# Patient Record
Sex: Female | Born: 1990
Health system: Southern US, Community
[De-identification: ages and names within clinical notes are randomized; demographics above are authoritative.]

## PROBLEM LIST (undated history)

## (undated) DIAGNOSIS — R519 Headache, unspecified: Secondary | ICD-10-CM

## (undated) DIAGNOSIS — F419 Anxiety disorder, unspecified: Secondary | ICD-10-CM

## (undated) DIAGNOSIS — F32A Depression, unspecified: Secondary | ICD-10-CM

## (undated) DIAGNOSIS — R030 Elevated blood-pressure reading, without diagnosis of hypertension: Secondary | ICD-10-CM

## (undated) DIAGNOSIS — R51 Headache: Secondary | ICD-10-CM

## (undated) DIAGNOSIS — N390 Urinary tract infection, site not specified: Secondary | ICD-10-CM

## (undated) DIAGNOSIS — J45909 Unspecified asthma, uncomplicated: Secondary | ICD-10-CM

## (undated) HISTORY — DX: Headache: R51

## (undated) HISTORY — DX: Unspecified asthma, uncomplicated: J45.909

## (undated) HISTORY — DX: Urinary tract infection, site not specified: N39.0

## (undated) HISTORY — DX: Headache, unspecified: R51.9

## (undated) HISTORY — DX: Elevated blood-pressure reading, without diagnosis of hypertension: R03.0

---

## 2011-02-01 ENCOUNTER — Other Ambulatory Visit (HOSPITAL_COMMUNITY)
Admission: RE | Admit: 2011-02-01 | Discharge: 2011-02-01 | Disposition: A | Discharge status: Home / Self Care | Payer: Commercial Indemnity | Source: Ambulatory Visit | Attending: Obstetrics and Gynecology | Admitting: Obstetrics and Gynecology

## 2011-02-01 DIAGNOSIS — Z113 Encounter for screening for infections with a predominantly sexual mode of transmission: Secondary | ICD-10-CM | POA: Insufficient documentation

## 2011-02-01 DIAGNOSIS — Z01419 Encounter for gynecological examination (general) (routine) without abnormal findings: Secondary | ICD-10-CM | POA: Insufficient documentation

## 2012-01-31 ENCOUNTER — Ambulatory Visit (INDEPENDENT_AMBULATORY_CARE_PROVIDER_SITE_OTHER): Payer: 59 | Admitting: Physician Assistant

## 2012-01-31 VITALS — BP 120/70 | HR 76 | Temp 97.6°F | Resp 16

## 2012-01-31 DIAGNOSIS — R0602 Shortness of breath: Secondary | ICD-10-CM

## 2012-01-31 LAB — POCT CBC
Granulocyte percent: 69.3 %G (ref 37–80)
HCT, POC: 46.7 % (ref 37.7–47.9)
Hemoglobin: 14.5 g/dL (ref 12.2–16.2)
Lymph, poc: 2 (ref 0.6–3.4)
MCH, POC: 29.1 pg (ref 27–31.2)
MCHC: 31 g/dL — AB (ref 31.8–35.4)
MCV: 93.6 fL (ref 80–97)
MID (cbc): 0.6 (ref 0–0.9)
MPV: 10.1 fL (ref 0–99.8)
POC Granulocyte: 5.8 (ref 2–6.9)
POC LYMPH PERCENT: 23.4 %L (ref 10–50)
POC MID %: 7.3 %M (ref 0–12)
Platelet Count, POC: 275 10*3/uL (ref 142–424)
RBC: 4.99 M/uL (ref 4.04–5.48)
RDW, POC: 14.7 %
WBC: 8.4 10*3/uL (ref 4.6–10.2)

## 2012-01-31 MED ORDER — ALBUTEROL SULFATE HFA 108 (90 BASE) MCG/ACT IN AERS: 2.0000 | INHALATION_SPRAY | Freq: Four times a day (QID) | RESPIRATORY_TRACT | Status: DC | PRN

## 2012-01-31 MED ORDER — RANITIDINE HCL 300 MG PO TABS: 300.0000 mg | ORAL_TABLET | Freq: Every day | ORAL | Status: DC

## 2012-01-31 MED ORDER — ALBUTEROL SULFATE (2.5 MG/3ML) 0.083% IN NEBU
2.5000 mg | INHALATION_SOLUTION | Freq: Once | RESPIRATORY_TRACT | Status: AC
  Administered 2012-01-31: 2.5 mg via RESPIRATORY_TRACT

## 2012-01-31 NOTE — Progress Notes (Signed)
  Subjective:    Patient ID: Katherine [name], female    DOB: 08/20/1990, 21 y.o.   MRN: [id number]  HPI 21 yr old CF presents with the sensation she can't get a deep breath in.  Denies chest pain.  She isn't sure if she is wheezing.  She has not felt sick.  This started this morning at around 10am. She felt as though her throat was swelling.  She has not been on any medications.  She hasn't taken any antibiotics.  She took motrin this morning for a headache, and she says that she takes motrin on a regular basis. Her throat is not sore.  She has used albuterol in the past for wheezing, but she has no history of asthma. Denies chest pain.  Review of Systems  All other systems reviewed and are negative.       Objective:   Physical Exam  Nursing note and vitals reviewed. Constitutional: She is oriented to person, place, and time. She appears well-developed and well-nourished.  HENT:  Head: Normocephalic and atraumatic.  Mouth/Throat: Oropharynx is clear and moist. Oropharyngeal exudate: no edema .  Airway patent. 3-4 erythematous 2-48mm lesions on palatine arches and soft palate.       B canals ~90% blocked with cerumen. Tonsils not enlarged. Uvula normal size and midline.  Neck: Normal range of motion. Neck supple. No thyromegaly present.  Cardiovascular: Normal rate, regular rhythm, normal heart sounds and intact distal pulses.  Exam reveals no gallop and no friction rub.   No murmur heard. Pulmonary/Chest: Effort normal. No respiratory distress (wheezes present only with forced expiration). She has wheezes. She has no rales.  Neurological: She is alert and oriented to person, place, and time.  Skin: Skin is warm and dry.  Psychiatric: She has a normal mood and affect. Her behavior is normal.     Results for orders placed in visit on 01/31/12  POCT CBC      Component Value Range   WBC 8.4  4.6 - 10.2 K/uL   Lymph, poc 2.0  0.6 - 3.4   POC LYMPH PERCENT 23.4  10 - 50 %L   MID (cbc) 0.6   0 - 0.9   POC MID % 7.3  0 - 12 %M   POC Granulocyte 5.8  2 - 6.9   Granulocyte percent 69.3  37 - 80 %G   RBC 4.99  4.04 - 5.48 M/uL   Hemoglobin 14.5  12.2 - 16.2 g/dL   HCT, POC 40.9  81.1 - 47.9 %   MCV 93.6  80 - 97 fL   MCH, POC 29.1  27 - 31.2 pg   MCHC 31.0 (*) 31.8 - 35.4 g/dL   RDW, POC 91.4     Platelet Count, POC 275  142 - 424 K/uL   MPV 10.1  0 - 99.8 fL   Subjective improvement after breathing treatment.     Assessment & Plan:  SOB/wheezing-improved after albuterol and she continues to have 100% pulse ox.  If there was an environmental trigger, we are unsure of what it is.  I advised avoiding motrin/ibuprofen for now although it is unlikely the etiology. Start Zantac and zyrtec to cover for allergic component.  Recheck in 48 hrs, to ER if worsens.

## 2012-01-31 NOTE — Patient Instructions (Addendum)
10 mg zyrtec daily X 2weeks.

## 2012-02-16 ENCOUNTER — Other Ambulatory Visit (HOSPITAL_COMMUNITY)
Admission: RE | Admit: 2012-02-16 | Discharge: 2012-02-16 | Disposition: A | Discharge status: Home / Self Care | Payer: 59 | Source: Ambulatory Visit | Attending: Obstetrics and Gynecology | Admitting: Obstetrics and Gynecology

## 2012-02-16 ENCOUNTER — Other Ambulatory Visit: Payer: Self-pay | Admitting: Obstetrics and Gynecology

## 2012-02-16 DIAGNOSIS — R87810 Cervical high risk human papillomavirus (HPV) DNA test positive: Secondary | ICD-10-CM | POA: Insufficient documentation

## 2012-02-16 DIAGNOSIS — Z113 Encounter for screening for infections with a predominantly sexual mode of transmission: Secondary | ICD-10-CM | POA: Insufficient documentation

## 2012-02-16 DIAGNOSIS — Z124 Encounter for screening for malignant neoplasm of cervix: Secondary | ICD-10-CM | POA: Insufficient documentation

## 2014-06-13 LAB — TSH: TSH: 1.65 u[IU]/mL (ref 0.41–5.90)

## 2014-06-13 LAB — CBC AND DIFFERENTIAL
HCT: 41 % (ref 36–46)
Hemoglobin: 13.8 g/dL (ref 12.0–16.0)
Platelets: 231 10*3/uL (ref 150–399)
WBC: 6.6 10^3/mL

## 2014-06-13 LAB — HEPATIC FUNCTION PANEL
ALT: 9 U/L (ref 7–35)
AST: 15 U/L (ref 13–35)
Bilirubin, Total: 0.5 mg/dL

## 2014-06-13 LAB — BASIC METABOLIC PANEL
BUN: 10 mg/dL (ref 4–21)
Creatinine: 0.6 mg/dL (ref 0.5–1.1)
Glucose: 86 mg/dL
Potassium: 4.8 mmol/L (ref 3.4–5.3)
Sodium: 142 mmol/L (ref 137–147)

## 2015-11-03 ENCOUNTER — Telehealth: Payer: Self-pay | Admitting: Family Medicine

## 2015-11-03 NOTE — Telephone Encounter (Signed)
Reason for call: Pt called to set up new pt appt and her sister recommended you. She has [employer] ins and was recently in [place]. She has been vomiting and had diarrhea for a couple days and wanting to see if we can fit her in sooner than 11/19/15 for new pt appt. Please advise.

## 2015-11-04 ENCOUNTER — Ambulatory Visit (INDEPENDENT_AMBULATORY_CARE_PROVIDER_SITE_OTHER): Payer: BLUE CROSS/BLUE SHIELD | Admitting: Family Medicine

## 2015-11-04 ENCOUNTER — Encounter: Payer: Self-pay | Admitting: Family Medicine

## 2015-11-04 VITALS — BP 128/89 | HR 97 | Temp 98.1°F | Ht 67.0 in | Wt 198.0 lb

## 2015-11-04 DIAGNOSIS — R1084 Generalized abdominal pain: Secondary | ICD-10-CM | POA: Diagnosis not present

## 2015-11-04 DIAGNOSIS — A09 Infectious gastroenteritis and colitis, unspecified: Secondary | ICD-10-CM

## 2015-11-04 DIAGNOSIS — R197 Diarrhea, unspecified: Secondary | ICD-10-CM

## 2015-11-04 MED ORDER — AZITHROMYCIN 500 MG PO TABS: 500.0000 mg | ORAL_TABLET | Freq: Every day | ORAL | Status: DC

## 2015-11-04 NOTE — Telephone Encounter (Signed)
Patient scheduled today at 3:45pm in a 15 minute slot will schedule new patient appointment when patient comes in.

## 2015-11-04 NOTE — Telephone Encounter (Signed)
That is fine. Will ask Clydie BraunKaren to get her scheduled today or tomorrow if possible
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
## 2015-11-04 NOTE — Progress Notes (Signed)
Pre visit review using our clinic tool,if applicable. No additional management support is needed unless otherwise documented below in the visit note.  

## 2015-11-04 NOTE — Patient Instructions (Signed)
It was nice to meet you today I will be in touch with your labs asap Start the azithromycin antibiotic once a day for 3 days Drink plenty of fluids and stick to a bland diet until you are better Let me know if you are not improving soon

## 2015-11-05 ENCOUNTER — Encounter: Payer: Self-pay | Admitting: Family Medicine

## 2015-11-05 LAB — CBC
HCT: 40.7 % (ref 36.0–46.0)
Hemoglobin: 13.8 g/dL (ref 12.0–15.0)
MCHC: 33.9 g/dL (ref 30.0–36.0)
MCV: 87.6 fl (ref 78.0–100.0)
Platelets: 284 10*3/uL (ref 150.0–400.0)
RBC: 4.65 Mil/uL (ref 3.87–5.11)
RDW: 12.7 % (ref 11.5–15.5)
WBC: 8.2 10*3/uL (ref 4.0–10.5)

## 2015-11-05 LAB — COMPREHENSIVE METABOLIC PANEL
ALT: 16 U/L (ref 0–35)
AST: 17 U/L (ref 0–37)
Albumin: 4.2 g/dL (ref 3.5–5.2)
Alkaline Phosphatase: 52 U/L (ref 39–117)
BUN: 11 mg/dL (ref 6–23)
CO2: 27 mEq/L (ref 19–32)
Calcium: 9.5 mg/dL (ref 8.4–10.5)
Chloride: 104 mEq/L (ref 96–112)
Creatinine, Ser: 0.77 mg/dL (ref 0.40–1.20)
GFR: 96.64 mL/min (ref >60.00)
Glucose, Bld: 81 mg/dL (ref 70–99)
Potassium: 4.1 mEq/L (ref 3.5–5.1)
Sodium: 137 mEq/L (ref 135–145)
Total Bilirubin: 0.4 mg/dL (ref 0.2–1.2)
Total Protein: 7.2 g/dL (ref 6.0–8.3)

## 2015-11-05 LAB — LIPASE: Lipase: 30 U/L (ref 11.0–59.0)

## 2015-11-24 ENCOUNTER — Ambulatory Visit (INDEPENDENT_AMBULATORY_CARE_PROVIDER_SITE_OTHER): Payer: BLUE CROSS/BLUE SHIELD | Admitting: Medical

## 2015-11-24 ENCOUNTER — Encounter: Payer: Self-pay | Admitting: Medical

## 2015-11-24 VITALS — BP 118/76 | HR 77 | Temp 98.1°F | Ht 67.0 in | Wt 200.4 lb

## 2015-11-24 DIAGNOSIS — J3489 Other specified disorders of nose and nasal sinuses: Secondary | ICD-10-CM | POA: Diagnosis not present

## 2015-11-24 DIAGNOSIS — R42 Dizziness and giddiness: Secondary | ICD-10-CM

## 2015-11-24 MED ORDER — AZELASTINE HCL 0.1 % NA SOLN: 2.0000 | Freq: Two times a day (BID) | NASAL | Status: DC

## 2015-11-24 MED ORDER — MECLIZINE HCL 12.5 MG PO TABS: 12.5000 mg | ORAL_TABLET | Freq: Three times a day (TID) | ORAL | Status: DC | PRN

## 2015-11-24 NOTE — Progress Notes (Signed)
   Subjective:    Patient ID: Katherine [name], female    DOB: 03/20/1991, 25 y.o.   MRN: [id number]  HPI  Pt in states yesterday morning after getting up she felt transient dizziness and unsteadiness all day. She states was moderate yesterday. Some present today but less today. Pt states pressure in her forehead. Pt denies any sneezing, itching eye or runny nose. No definite fevers chills or sweats.  Pt states when she lays on her back and turn her head notices some room spinning when she turns her head. More notices when turns to the left.   Pt states no ha. Pt states yesterday at work mild straining looking at computer. Pt states about one year since she got eyes checked.  LMP- Nov 08, 2015. Normal cycle and came when expected.  Earlier this month pt cbc and cmp looked normal.   Review of Systems  Constitutional: Negative for fever, chills and fatigue.  Respiratory: Negative for cough, shortness of breath and wheezing.   Cardiovascular: Negative for chest pain and palpitations.  Gastrointestinal: Negative for nausea, vomiting and abdominal pain.  Musculoskeletal: Negative for back pain.  Skin: Negative for rash.  Neurological: Positive for dizziness. Negative for speech difficulty, weakness, numbness and headaches.  Hematological: Negative for adenopathy. Does not bruise/bleed easily.  Psychiatric/Behavioral: Negative for behavioral problems, dysphoric mood and decreased concentration. The patient is not nervous/anxious.        Objective:   Physical Exam  General Mental Status- Alert. General Appearance- Not in acute distress.    HEENT Head- Normal. Ear Auditory Canal - Left- Normal. Right - Normal.Tympanic Membrane- Left- Normal. Right- Normal. Eye Sclera/Conjunctiva- Left- Normal. Right- Normal. Nose & Sinuses Nasal Mucosa- Left-  Not Boggy and Congested. Right-not  Boggy and  Congested.Bilateral  No maxillary and but reported some  frontal sinus pressure.(none on  palpation)  Mouth & Throat Lips: Upper Lip- Normal: no dryness, cracking, pallor, cyanosis, or vesicular eruption. Lower Lip-Normal: no dryness, cracking, pallor, cyanosis or vesicular eruption. Buccal Mucosa- Bilateral- No Aphthous ulcers. Oropharynx- No Discharge or Erythema. Tonsils: Characteristics- Bilateral- No Erythema or Congestion. Size/Enlargement- Bilateral- No enlargement. Discharge- bilateral-None.   Skin General: Color- Normal Color. Moisture- Normal Moisture.  Neck Carotid Arteries- Normal color. Moisture- Normal Moisture. No carotid bruits. No JVD.  Chest and Lung Exam Auscultation: Breath Sounds:-Normal.  Cardiovascular Auscultation:Rythm- Regular. Murmurs & Other Heart Sounds:Auscultation of the heart reveals- No Murmurs.  Abdomen Inspection:-Inspeection Normal. Palpation/Percussion:Note:No mass. Palpation and Percussion of the abdomen reveal- Non Tender, Non Distended + BS, no rebound or guarding.   Neurologic Cranial Nerve exam:- CN III-XII intact(No nystagmus), symmetric smile. Drift Test:- No drift. Romberg Exam:- Negative.  Heal to Toe Gait exam:-Normal. Finger to Nose:- Normal/Intact Strength:- 5/5 equal and symmetric strength both upper and lower extremities.  Vision done today. See hearing and vision section.     Assessment & Plan:  For your dizziness and mild features of vertigo will rx meclizine.   I think your dizziness will gradually subside. If the features of spinning upon  head turning  Is not resolving then may refer to PT for head maneuver exercises.   For mild sinus pressure will rx astelin. If sinus pressure worsens notify us.  Regarding dizziness if persisting or with new signs and symptoms as discussed then will need to expand work up.  Red flags discussed that would necessitate ED evaluation.  Follow up in 10-14 days or as needed

## 2015-11-24 NOTE — Patient Instructions (Addendum)
For your dizziness and mild features of vertigo will rx meclizine.   I think your dizziness will gradually subside. If the features of spinning upon  head turning  Is not resolving then may refer to PT for head maneuver exercises.   For mild sinus pressure will rx astelin. If sinus pressure worsens notify us.  Regarding dizziness if persisting or with new signs and symptoms as discussed then will need to expand work up.  Red flags discussed that would necessitate ED evaluation.  Follow up in 10-14 days or as needed

## 2015-11-24 NOTE — Progress Notes (Signed)
Pre visit review using our clinic review tool, if applicable. No additional management support is needed unless otherwise documented below in the visit note. 

## 2016-01-13 ENCOUNTER — Telehealth: Payer: Self-pay | Admitting: *Deleted

## 2016-01-13 NOTE — Telephone Encounter (Signed)
Unable to reach patient at time of Pre-Visit Call.  Left message for patient to return call when available.    

## 2016-01-13 NOTE — Telephone Encounter (Signed)
Patient returning your call.

## 2016-01-13 NOTE — Telephone Encounter (Signed)
Returned call, no answer.

## 2016-01-14 ENCOUNTER — Encounter: Payer: Self-pay | Admitting: Family Medicine

## 2016-01-14 ENCOUNTER — Ambulatory Visit (INDEPENDENT_AMBULATORY_CARE_PROVIDER_SITE_OTHER): Payer: BLUE CROSS/BLUE SHIELD | Admitting: Family Medicine

## 2016-01-14 VITALS — BP 127/76 | HR 83 | Temp 97.9°F | Ht 68.0 in | Wt 202.2 lb

## 2016-01-14 DIAGNOSIS — Z7189 Other specified counseling: Secondary | ICD-10-CM

## 2016-01-14 DIAGNOSIS — E663 Overweight: Secondary | ICD-10-CM | POA: Diagnosis not present

## 2016-01-14 DIAGNOSIS — N390 Urinary tract infection, site not specified: Secondary | ICD-10-CM

## 2016-01-14 DIAGNOSIS — M791 Myalgia: Secondary | ICD-10-CM

## 2016-01-14 DIAGNOSIS — M7918 Myalgia, other site: Secondary | ICD-10-CM

## 2016-01-14 DIAGNOSIS — Z7689 Persons encountering health services in other specified circumstances: Secondary | ICD-10-CM

## 2016-01-14 LAB — POC URINALSYSI DIPSTICK (AUTOMATED)
Bilirubin, UA: NEGATIVE
Blood, UA: NEGATIVE
Glucose, UA: NEGATIVE
Ketones, UA: NEGATIVE
Leukocytes, UA: NEGATIVE
Nitrite, UA: NEGATIVE
Protein, UA: NEGATIVE
Spec Grav, UA: >=1.03
Urobilinogen, UA: NEGATIVE
pH, UA: 6

## 2016-01-14 MED ORDER — NITROFURANTOIN MONOHYD MACRO 100 MG PO CAPS: ORAL_CAPSULE | ORAL | Status: DC

## 2016-01-14 NOTE — Progress Notes (Signed)
[facility] [address], [address] [place], [place] [address] [phone] [phone]  Date:  01/14/2016   Name:  Katherine RussianRebekah [name]   DOB:  08/25/1990   MRN:  [id number]  PCP:  Abbe AmsterdamOPLAND,JESSICA, MD    Chief Complaint: Establish Care   History of Present Illness:  Katherine RussianRebekah [name] is a 25 y.o. very pleasant female patient who presents with the following:  Seen here a couple of months ago following a trip to [place] and associated diarrhea. She is now fully recovered from her GI illness.  She has frequent UTI- she may notice mild dysuria off and on on a consistent basis.  She will use azo prn.  She has noted some vague discomfort for the last few days and wanted to be checked today  She has been dx with UTI 3-4x in the last year.  She did have culture proven UTI per her report She has been with her current partner for 3 years or so.  She has regular GYN care She feels like this is more frequent when she drinks tea.   No belly pain, back pain or fever.    No vaginal sympotms Generally in good health No surgical history They use condoms for contraception-she has tried hormonal contraception but did not like SE Also she has noted some pain and tingling in her left leg/ buttock with prolonged sitting.  NKI.    There are no active problems to display for this patient.   No past medical history on file.  No past surgical history on file.  Social History  Substance Use Topics  . Smoking status: Never Smoker   . Smokeless tobacco: Never Used  . Alcohol Use: 1.2 - 1.8 oz/week    2-3 Standard drinks or equivalent per week     Comment: occassionally    No family history on file.  Allergies  Allergen Reactions  . Prednisone Other (See Comments)    Pt states "It makes me feel weird.    Medication list has been reviewed and updated.  No current outpatient prescriptions on file prior to visit.   No current facility-administered medications  on file prior to visit.    Review of Systems:  As per HPI- otherwise negative.   Physical Examination: Filed Vitals:   01/14/16 0836  BP: 127/76  Pulse: 83  Temp: 97.9 F (36.6 C)   Filed Vitals:   01/14/16 0836  Height: 5\' 8"  (1.727 m)  Weight: 202 lb 3.2 oz (91.717 kg)   Body mass index is 30.75 kg/(m^2). Ideal Body Weight: Weight in (lb) to have BMI = 25: 164.1  GEN: WDWN, NAD, Non-toxic, A & O x 3, overweight, looks well HEENT: Atraumatic, Normocephalic. Neck supple. No masses, No LAD. Ears and Nose: No external deformity. CV: RRR, No M/G/R. No JVD. No thrill. No extra heart sounds. PULM: CTA B, no wheezes, crackles, rhonchi. No retractions. No resp. distress. No accessory muscle use. ABD: S, NT, ND. No rebound. No HSM. EXTR: No c/c/e NEURO Normal gait.  PSYCH: Normally interactive. Conversant. Not depressed or anxious appearing.  Calm demeanor.  She has tenderness over the left sciatic notch/piraformis muscle  Results for orders placed or performed in visit on 01/14/16  POCT Urinalysis Dipstick (Automated)  Result Value Ref Range   Color, UA yellow    Clarity, UA clear    Glucose, UA negative    Bilirubin, UA negative    Ketones, UA negative  Spec Grav, UA >=1.030    Blood, UA negative    pH, UA 6.0    Protein, UA negative    Urobilinogen, UA negative    Nitrite, UA negative    Leukocytes, UA Negative Negative     Assessment and Plan: Recurrent UTI - Plan: POCT Urinalysis Dipstick (Automated), nitrofurantoin, macrocrystal-monohydrate, (MACROBID) 100 MG capsule, Urine culture  Piriformis muscle pain  Overweight  Encounter to establish care  Recent history of recurrent UTI_ at this time her urine appears negative, but will culture for her.  She would like to try taking an abx as needed following intercourse (she estimates she and her partner have sex 2x a week on average so this is a reasonable plan. rx for macrobid to try in this manner, she will  let me know how this does for her Await urine culture but do not suspect current UTI  Discussed tennis ball massage/ piriformis stretch for her- she will let me know if this does not help with her left buttock and upper thigh pain  Signed Abbe Amsterdam, MD

## 2016-01-14 NOTE — Patient Instructions (Signed)
It was good to see you today Tennis ball massage and the crossed over leg stretch can be helpful for your piriformis muscle Try taking a macrobid pill following intercourse to see if we can prevent such frequent UTI- I will also let you know if your urine culture shows a current UTI, but I think it will be negative  Take care and see me as needed

## 2016-01-14 NOTE — Progress Notes (Signed)
Pre visit review using our clinic review tool, if applicable. No additional management support is needed unless otherwise documented below in the visit note. 

## 2016-01-20 ENCOUNTER — Telehealth: Payer: Self-pay | Admitting: *Deleted

## 2016-01-20 NOTE — Telephone Encounter (Signed)
Forwarded to Dr. Copland 

## 2016-01-24 ENCOUNTER — Telehealth: Payer: Self-pay | Admitting: Family Medicine

## 2016-01-24 NOTE — Telephone Encounter (Signed)
Received her records from [facility]- abstract all pertinent information.

## 2016-01-27 ENCOUNTER — Telehealth: Payer: Self-pay | Admitting: Family Medicine

## 2016-01-27 NOTE — Telephone Encounter (Signed)
Urine culture did not get sent out from her last visit.  Called her and LMOM- I apologize that her culture did not get to the lab for whatever reason.   Her urine dip looked fine, so if she is feeling well nothing further needed.  However if she has any current UTI sx please let me know and I will order a culture for her, she can drop off a sample

## 2016-01-27 NOTE — Telephone Encounter (Signed)
-----   Message from Cammy Copa, [place] sent at 01/27/2016  2:56 PM EDT -----   ----- Message ----- From: Pearline Cables, MD Sent: 01/24/2016   7:10 AM To: Cammy Copa, CMA  We saw her on 7/13 and ordered a urine culture that has not resulted.  Can you please ask the lab about this?   Thanks! JC

## 2016-02-11 ENCOUNTER — Other Ambulatory Visit: Payer: Self-pay | Admitting: Family Medicine

## 2016-02-25 ENCOUNTER — Encounter: Payer: Self-pay | Admitting: Family Medicine

## 2016-03-17 ENCOUNTER — Ambulatory Visit (INDEPENDENT_AMBULATORY_CARE_PROVIDER_SITE_OTHER): Payer: BLUE CROSS/BLUE SHIELD | Admitting: Family Medicine

## 2016-03-17 ENCOUNTER — Encounter: Payer: Self-pay | Admitting: Family Medicine

## 2016-03-17 VITALS — BP 127/95 | HR 91 | Temp 98.0°F | Ht 68.0 in | Wt 208.8 lb

## 2016-03-17 DIAGNOSIS — J029 Acute pharyngitis, unspecified: Secondary | ICD-10-CM

## 2016-03-17 MED ORDER — PENICILLIN V POTASSIUM 500 MG PO TABS: 500.0000 mg | ORAL_TABLET | Freq: Two times a day (BID) | ORAL | 0 refills | Status: DC

## 2016-03-17 NOTE — Progress Notes (Signed)
[facility] [address], [address] [place], [place] [address] [phone]  Date:  03/17/2016   Name:  Katherine [name]   DOB:  1991/04/19   MRN:  [id number]  PCP:  Abbe Amsterdam, MD    Chief Complaint: Sore Throat (c/o sore throat, bump on the back of throat, diff swallowing, loss of appetite, fatigue, and ear pain x 4 days. )   History of Present Illness:  Katherine [name] is a 25 y.o. very pleasant female patient who presents with the following:  Generally healthy young lady here today with complaint of a sore throat.  Today is Thursday- on Monday she noted a ST, ear pain, she has not felt well in general, has had body aches.   She has measured a fever but has felt hot and flushed No vomiting or diarrhea, no belly pain- but she has not felt much like eating.   She has used nyquil at night but not other meds tried  Her father is getting married this weekend- she would like to be well for this event if she can No known sick contacts No cough Both of her ears hurt- neither one really more than the other  LMP 9/3  BP Readings from Last 3 Encounters:  03/17/16 (!) 127/95  01/14/16 127/76  11/24/15 118/76     Patient Active Problem List   Diagnosis Date Noted  . Overweight 01/14/2016    Past Medical History:  Diagnosis Date  . Asthma   . Elevated blood pressure reading   . Frequent headaches   . Recurrent UTI     No past surgical history on file.  Social History  Substance Use Topics  . Smoking status: Never Smoker  . Smokeless tobacco: Never Used  . Alcohol use 1.2 - 1.8 oz/week    2 - 3 Standard drinks or equivalent per week     Comment: occassionally    Family History  Problem Relation Age of Onset  . Hypertension Father   . Hypertension Brother     Allergies  Allergen Reactions  . Prednisone Other (See Comments)    Pt states "It makes me feel weird.    Medication list has been reviewed and  updated.  Current Outpatient Prescriptions on File Prior to Visit  Medication Sig Dispense Refill  . ibuprofen (ADVIL,MOTRIN) 200 MG tablet Take 200 mg by mouth every 6 (six) hours as needed.    . nitrofurantoin, macrocrystal-monohydrate, (MACROBID) 100 MG capsule Take 1 pill as needed following intercourse 30 capsule 1   No current facility-administered medications on file prior to visit.     Review of Systems:  As per HPI- otherwise negative.   Physical Examination: Vitals:   03/17/16 1822  BP: (!) 127/95  Pulse: 91  Temp: 98 F (36.7 C)   Vitals:   03/17/16 1822  Weight: 208 lb 12.8 oz (94.7 kg)  Height: 5\' 8"  (1.727 m)   Body mass index is 31.75 kg/m. Ideal Body Weight: Weight in (lb) to have BMI = 25: 164.1  GEN: WDWN, NAD, Non-toxic, A & O x 3, overweight, looks well HEENT: Atraumatic, Normocephalic. Neck supple. No masses, No LAD.  Bilateral TM wnl, oropharynx is inflamed but no exudate.  Tender cervical nodes, PEERL,EOMI.   Ears and Nose: No external deformity. CV: RRR, No M/G/R. No JVD. No thrill. No extra heart sounds. PULM: CTA B, no wheezes, crackles, rhonchi. No retractions. No resp. distress. No accessory  muscle use. ABD: S, NT, ND EXTR: No c/c/e NEURO Normal gait.  PSYCH: Normally interactive. Conversant. Not depressed or anxious appearing.  Calm demeanor.    Assessment and Plan: Acute pharyngitis, unspecified pharyngitis type - Plan: Culture, Group A Strep, penicillin v potassium (VEETID) 500 MG tablet  Negative rapid strep but her sx and exam are suggestive.  Will start on penicillin now, await culture Discussed supportive care, close follow-up if not better  She has noted mildly elevated DBP at her last visit with OBG and now today- she will monitor this   Signed Abbe AmsterdamJessica Copland, MD

## 2016-03-17 NOTE — Patient Instructions (Signed)
We are going to treat you for likely strep throat with penicillin- take this twice a day for 10 days I will send a throat culture as well If you are not feeling better in the next couple of days please let me know!

## 2016-03-20 LAB — CULTURE, GROUP A STREP: Organism ID, Bacteria: NORMAL

## 2016-04-26 ENCOUNTER — Ambulatory Visit (INDEPENDENT_AMBULATORY_CARE_PROVIDER_SITE_OTHER): Payer: BLUE CROSS/BLUE SHIELD | Admitting: Physician Assistant

## 2016-04-26 VITALS — BP 127/91 | HR 115 | Temp 98.1°F | Resp 16 | Ht 68.0 in | Wt 205.4 lb

## 2016-04-26 DIAGNOSIS — B9789 Other viral agents as the cause of diseases classified elsewhere: Secondary | ICD-10-CM

## 2016-04-26 DIAGNOSIS — J069 Acute upper respiratory infection, unspecified: Secondary | ICD-10-CM | POA: Diagnosis not present

## 2016-04-26 DIAGNOSIS — J029 Acute pharyngitis, unspecified: Secondary | ICD-10-CM | POA: Diagnosis not present

## 2016-04-26 LAB — POC INFLUENZA A&B (BINAX/QUICKVUE)
Influenza A, POC: NEGATIVE
Influenza B, POC: NEGATIVE

## 2016-04-26 NOTE — Addendum Note (Signed)
Addended by: Regis BillSCATES, Voris Tigert L on: 04/26/2016 04:05 PM   Modules accepted: Orders

## 2016-04-26 NOTE — Progress Notes (Signed)
   Patient presents to clinic today c/o 1.5 days of sore throat, nasal congestion, PND and dry cough. Patient also notes ear pain. Endorses aches, fatigue. Endorses recent travel to [place]. Notes some sick contacts on the plane. Has noted mild fever at 100 yesterday. Has not taken anything for symptoms today.  Past Medical History:  Diagnosis Date  . Asthma   . Elevated blood pressure reading   . Frequent headaches   . Recurrent UTI     Current Outpatient Prescriptions on File Prior to Visit  Medication Sig Dispense Refill  . ibuprofen (ADVIL,MOTRIN) 200 MG tablet Take 200 mg by mouth every 6 (six) hours as needed.     No current facility-administered medications on file prior to visit.     Allergies  Allergen Reactions  . Prednisone Other (See Comments)    Pt states "It makes me feel weird.    Family History  Problem Relation Age of Onset  . Hypertension Father   . Hypertension Brother     Social History   Social History  . Marital status: Single    Spouse name: N/A  . Number of children: N/A  . Years of education: N/A   Occupational History  . CSR    Social History Main Topics  . Smoking status: Never Smoker  . Smokeless tobacco: Never Used  . Alcohol use 1.2 - 1.8 oz/week    2 - 3 Standard drinks or equivalent per week     Comment: occassionally  . Drug use: No  . Sexual activity: Yes   Other Topics Concern  . Not on file   Social History Narrative  . No narrative on file   Review of Systems - See HPI.  All other ROS are negative.  BP (!) 127/91 (BP Location: Left Arm, Patient Position: Sitting, Cuff Size: Normal)   Pulse (!) 115   Temp 98.1 F (36.7 C) (Oral)   Resp 16   Ht 5\' 8"  (1.727 m)   Wt 205 lb 6 oz (93.2 kg)   SpO2 96%   BMI 31.23 kg/m   Physical Exam  Constitutional: She is oriented to person, place, and time and well-developed, well-nourished, and in no distress.  HENT:  Head: Normocephalic and atraumatic.  Right Ear: External  ear normal.  Left Ear: External ear normal.  Nose: Nose normal.  Mouth/Throat: Uvula is midline. Posterior oropharyngeal erythema present. No oropharyngeal exudate or posterior oropharyngeal edema.  Eyes: Conjunctivae are normal.  Neck: Neck supple.  Cardiovascular: Normal rate, regular rhythm, normal heart sounds and intact distal pulses.   Pulmonary/Chest: Effort normal and breath sounds normal. No respiratory distress. She has no wheezes. She has no rales. She exhibits no tenderness.  Lymphadenopathy:    She has no cervical adenopathy.  Neurological: She is alert and oriented to person, place, and time.  Skin: Skin is warm and dry. No rash noted.  Psychiatric: Affect normal.  Vitals reviewed.  Recent Results (from the past 2160 hour(s))  Culture, Group A Strep     Status: None   Collection Time: 03/17/16  6:38 PM  Result Value Ref Range   Organism ID, Bacteria      Normal Upper Respiratory Flora No Beta Hemolytic Streptococci Isolated    Assessment/Plan: 1. Viral URI with cough Rapid strep negative. Flu negative. Viral etiology. Will begin supportive measures and OTC medications. FU if not resolving. Handout given to patient.  - POC Influenza A&B (Binax test)    Piedad ClimesMartin, Nicollette Wilhelmi Cody, PA-C

## 2016-04-26 NOTE — Patient Instructions (Signed)
Please stay well hydrated and get plenty of rest. Place a humidifier in the bedroom. GEt some saline nasal spray to flush out nasal passages. Salt-water gargles will help with sore throat. Mucinex-DM for cough.  Follow-up if not slowly improving over the next 4-5 days. We will call with throat culture results.

## 2016-04-26 NOTE — Progress Notes (Signed)
Pre visit review using our clinic review tool, if applicable. No additional management support is needed unless otherwise documented below in the visit note/SLS  

## 2016-04-28 ENCOUNTER — Telehealth: Payer: Self-pay | Admitting: Family Medicine

## 2016-04-28 DIAGNOSIS — J209 Acute bronchitis, unspecified: Secondary | ICD-10-CM

## 2016-04-28 LAB — CULTURE, GROUP A STREP: Organism ID, Bacteria: NORMAL

## 2016-04-28 MED ORDER — AZITHROMYCIN 250 MG PO TABS: ORAL_TABLET | ORAL | 0 refills | Status: DC

## 2016-04-28 NOTE — Telephone Encounter (Signed)
Caller name:Taffany Relationship to patient: Can be [phone] Pharmacy:[facility] on [address]  Reason for call:Patient saw Selena BattenCody on 04/26/16, not feeling any better. Requesting to speak with Dr Patsy Lageropland.

## 2016-04-28 NOTE — Telephone Encounter (Signed)
Called her back- today is Thursday. She has been ill since Sunday, was seen in clinic on Tuesday with likely viral illness.  Negative flu, negative rapid strep and negative throat culture.  However she notes that she is not improving, continues to have low grade fevers to about 100.5, her main concern now is a productive cough that is keeping her awake at night.    Will rx azithromycin for her for possible bronchitis- she will let me know if not better by next week, Sooner if worse.    LMP 04/07/16

## 2016-05-04 ENCOUNTER — Telehealth: Payer: Self-pay | Admitting: Emergency Medicine

## 2016-05-04 MED ORDER — GUAIFENESIN-CODEINE 100-10 MG/5ML PO SYRP: 5.0000 mL | ORAL_SOLUTION | Freq: Three times a day (TID) | ORAL | 0 refills | Status: DC | PRN

## 2016-05-04 NOTE — Telephone Encounter (Signed)
Called her back- she is overall feeling better, but she is still coughing.  Her left ear can feel "full" off an on.  She would like to have something for her cough as it is keeping her and her BF awake.  Will try cheratussin as I can call this in for her- cautioned her that it will cause sedation. She denies any chance of pregnancy.  She will let me know if not feeling better soon

## 2016-05-04 NOTE — Addendum Note (Signed)
Addended by: Abbe AmsterdamOPLAND, Jorgen Wolfinger C on: 05/04/2016 05:42 PM   Modules accepted: Orders

## 2016-05-04 NOTE — Telephone Encounter (Signed)
Pt called back today because now her cough is worse and her ear feels full. Pt states that she did take the azithromycin as prescribed and it did help some with her other sx's. Pt has tried otc meds to help with the ear fullness with no help. Pt doesn't mind coming in to be seen but would like to know if there's anything else she should try before coming in since she has already missed so many days of work. Please advise.

## 2016-05-05 ENCOUNTER — Ambulatory Visit (INDEPENDENT_AMBULATORY_CARE_PROVIDER_SITE_OTHER): Payer: BLUE CROSS/BLUE SHIELD | Admitting: Medical

## 2016-05-05 VITALS — BP 130/80 | HR 91 | Temp 98.3°F | Wt 203.4 lb

## 2016-05-05 DIAGNOSIS — R030 Elevated blood-pressure reading, without diagnosis of hypertension: Secondary | ICD-10-CM | POA: Diagnosis not present

## 2016-05-05 DIAGNOSIS — H6692 Otitis media, unspecified, left ear: Secondary | ICD-10-CM | POA: Diagnosis not present

## 2016-05-05 MED ORDER — AMOXICILLIN-POT CLAVULANATE 875-125 MG PO TABS: 1.0000 | ORAL_TABLET | Freq: Two times a day (BID) | ORAL | 0 refills | Status: DC

## 2016-05-05 NOTE — Progress Notes (Signed)
Pre visit review using our clinic review tool, if applicable. No additional management support is needed unless otherwise documented below in the visit note. 

## 2016-05-05 NOTE — Progress Notes (Signed)
Subjective:    Patient ID: Katherine RussianRebekah [name], female    DOB: 08/08/1990, 25 y.o.   MRN: [id number]  HPI  Pt in for follow up. p seen on 04-26-2016. Pt saw Selena BattenCody and diagnosed with viral uri. Pt has strep test and flu test. Both came back negative. Pt called back later and her throat was still sore with fever and ear fullness/pain. Pt was given azithromycin when she called back and talked with Dr. Dallas Schimkeopeland. She feels overall better but has some residual cough and left ear mild pressure/fullness.  No st now. No fever, no chills or sweats. No wheezing.  LMP- Apr 07, 2016.   Pt also was given robitussin with codeine.  Pt also went to oral surgeon office today. Her blood pressure was 158/102. Pt blood pressure was checked with machine twice. Pt never had high blood pressure before like at oral surgeon. ekg done at office looked normal but one lead. No 12 lead? Pt did not have any chest pain or neurologic signs or symptoms.   Her blood pressure was at oral surgeon office(oral surgery center/Dr. Hyacinth MeekerMiller) was 158/102 and 149/102. Pt stats she did not feel that she was nervous today. But speculates maybe thought of procdure caused some anxiety?  Oral surgeon sent her in for evaluation. They want clearance. Pt was given rx of valium 5 mg Wednesday night and 5 mg for Thursday. These are to take before the procedure.  Pt states no caffeine. No decongestants used. Pt not a smoker.   Review of Systems  Constitutional: Negative for chills, fatigue and fever.  HENT: Positive for ear pain. Negative for congestion, hearing loss, nosebleeds, postnasal drip, rhinorrhea, sinus pressure and sore throat.   Respiratory: Positive for cough. Negative for chest tightness, shortness of breath and wheezing.   Cardiovascular: Negative for chest pain and palpitations.  Gastrointestinal: Negative for abdominal pain.  Musculoskeletal: Negative for back pain.  Skin: Negative for rash.  Neurological: Negative for  dizziness, speech difficulty, weakness, light-headedness and headaches.  Hematological: Negative for adenopathy. Does not bruise/bleed easily.  Psychiatric/Behavioral: Negative for behavioral problems and confusion.    Past Medical History:  Diagnosis Date  . Asthma   . Elevated blood pressure reading   . Frequent headaches   . Recurrent UTI      Social History   Social History  . Marital status: Single    Spouse name: N/A  . Number of children: N/A  . Years of education: N/A   Occupational History  . CSR    Social History Main Topics  . Smoking status: Never Smoker  . Smokeless tobacco: Never Used  . Alcohol use 1.2 - 1.8 oz/week    2 - 3 Standard drinks or equivalent per week     Comment: occassionally  . Drug use: No  . Sexual activity: Yes   Other Topics Concern  . Not on file   Social History Narrative  . No narrative on file    No past surgical history on file.  Family History  Problem Relation Age of Onset  . Hypertension Father   . Hypertension Brother     Allergies  Allergen Reactions  . Prednisone Other (See Comments)    Pt states "It makes me feel weird.    Current Outpatient Prescriptions on File Prior to Visit  Medication Sig Dispense Refill  . guaiFENesin-codeine (ROBITUSSIN AC) 100-10 MG/5ML syrup Take 5 mLs by mouth 3 (three) times daily as needed for cough. 120 mL 0  .  ibuprofen (ADVIL,MOTRIN) 200 MG tablet Take 200 mg by mouth every 6 (six) hours as needed.    . Multiple Vitamins-Minerals (THRIVE FOR LIFE WOMENS) TABS Take by mouth daily as needed.     No current facility-administered medications on file prior to visit.     BP 130/80   Pulse 91   Temp 98.3 F (36.8 C) (Oral)   Wt 203 lb 6.4 oz (92.3 kg)   SpO2 98%   BMI 30.93 kg/m       Objective:   Physical Exam   General  Mental Status - Alert. General Appearance - Well groomed. Not in acute distress.  Skin Rashes- No Rashes.  HEENT Head- Normal. Ear Auditory  Canal - Left- Normal. Right - Normal.Tympanic Membrane- Left- tm moderate red over entire tm. Right- Normal. Eye Sclera/Conjunctiva- Left- Normal. Right- Normal. Nose & Sinuses Nasal Mucosa- Left-  Boggy and Congested. Right-  Boggy and  Congested.Bilateral  No maxillary and no  frontal sinus pressure. Mouth & Throat Lips: Upper Lip- Normal: no dryness, cracking, pallor, cyanosis, or vesicular eruption. Lower Lip-Normal: no dryness, cracking, pallor, cyanosis or vesicular eruption. Buccal Mucosa- Bilateral- No Aphthous ulcers. Oropharynx- No Discharge or Erythema. Tonsils: Characteristics- Bilateral- No Erythema or Congestion. Size/Enlargement- Bilateral- No enlargement. Discharge- bilateral-None.  Neck Neck- Supple. No Masses.   Chest and Lung Exam Auscultation: Breath Sounds:-Clear even and unlabored.  Cardiovascular Auscultation:Rythm- Regular, rate and rhythm. Murmurs & Other Heart Sounds:Ausculatation of the heart reveal- No Murmurs.  Lymphatic Head & Neck General Head & Neck Lymphatics: Bilateral: Description- No Localized lymphadenopathy.   Abdomen Inspection:-Inspeection Normal. Palpation/Percussion:Note:No mass. Palpation and Percussion of the abdomen reveal- Non Tender, Non Distended + BS, no rebound or guarding.   Neurologic Cranial Nerve exam:- CN III-XII intact(No nystagmus), symmetric smile. Strength:- 5/5 equal and symmetric strength both upper and lower extremities.     Assessment & Plan:  You do appear to have lt ear infection. Azithromycin is good antibiotic but doe not cover all ear infection. Rx of augmentin antibiotic and continue robitussin with codeine cough syrup.  Regarding you blood pressure reading at oral surgeon office, I think bp was good today in our office with bp of 116/78 and when I rechecked was 130/80. I think at oral surgeon bp elevation today the high readings  Were stress response. They plan to give you valium 5 mg on wed night and  another valium to  take in am day of the surgery. I will try to call oral surgeon office tomorrow am to see what there cut off level is to cancel procedure and try to discuss with Dr. Patsy Lageropland.  Follow up in 7-10 days or as needed  Katherine [name], Katherine [name], [employer] CorporationPA-C

## 2016-05-05 NOTE — Telephone Encounter (Signed)
Oral surgery center sent pt over since on office visit her bp was quite high about 158/102. Then lower on second check. Pt saw Dr. Hyacinth MeekerMiller. Pt states they want clearance or advise. My question for there office is what is there cut off limit that they won't do surgery. Pt bp in our office yesterday was 116/78 by  and on recheck by myself was 130/80. What is there their bp limit that they consider too high? This would be helpful to know.  It think her bp was high the other day since she was at their office. Maybe stressed nervous about prospect of surgery?   Would you ask Dental assitant or hygienist this question. Also what medication will be used for anesthesia?  Pt came over but did not have form? Did they send her? She does not have a htn diagnosis.  I am going to be gone all next week. So would like an update on this tomorrow morning early. Pt is having the tooth extraction next Thursday. Pt pcp is Dr. Patsy Lageropland

## 2016-05-05 NOTE — Patient Instructions (Signed)
You do appear to have lt ear infection. Azithromycin is good antibiotic but doe not cover all ear infection. Rx of augmentin antibiotic and continue robitussin with codeine cough syrup.  Regarding you blood pressure reading at oral surgeon office, I think bp was good today in our office with bp of 116/78 and when I rechecked was 130/80. I think at oral surgeon bp elevation today the high readings  Were stress response. They plan to give you valium 5 mg on wed night and another valium to  take in am day of the surgery. I will try to call oral surgeon office tomorrow am to see what there cut off level is to cancel procedure and try to discuss with Dr. Patsy Lageropland.  Follow up in 7-10 days or as needed

## 2016-05-06 NOTE — Telephone Encounter (Signed)
Oral Surgery Center currently closed.

## 2016-08-01 DIAGNOSIS — H5712 Ocular pain, left eye: Secondary | ICD-10-CM | POA: Diagnosis not present

## 2016-08-29 ENCOUNTER — Ambulatory Visit (INDEPENDENT_AMBULATORY_CARE_PROVIDER_SITE_OTHER): Payer: BLUE CROSS/BLUE SHIELD | Admitting: Family Medicine

## 2016-08-29 ENCOUNTER — Encounter: Payer: Self-pay | Admitting: Family Medicine

## 2016-08-29 ENCOUNTER — Ambulatory Visit (HOSPITAL_BASED_OUTPATIENT_CLINIC_OR_DEPARTMENT_OTHER)
Admission: RE | Admit: 2016-08-29 | Discharge: 2016-08-29 | Disposition: A | Discharge status: Home / Self Care | Payer: BLUE CROSS/BLUE SHIELD | Source: Ambulatory Visit | Attending: Family Medicine | Admitting: Family Medicine

## 2016-08-29 VITALS — BP 115/75 | HR 81 | Temp 98.5°F | Ht 68.0 in | Wt 207.4 lb

## 2016-08-29 DIAGNOSIS — M25552 Pain in left hip: Secondary | ICD-10-CM

## 2016-08-29 DIAGNOSIS — W880XXA Exposure to X-rays, initial encounter: Secondary | ICD-10-CM

## 2016-08-29 DIAGNOSIS — M545 Low back pain: Secondary | ICD-10-CM | POA: Diagnosis not present

## 2016-08-29 MED ORDER — MELOXICAM 15 MG PO TABS: 15.0000 mg | ORAL_TABLET | Freq: Every day | ORAL | 1 refills | Status: DC
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
## 2016-08-29 NOTE — Patient Instructions (Signed)
We are going to get plain x-rays of your back and hip today- I will be in touch with these results (but we assume they will be normal!)  Try the mobic as needed for your pain- take one half or one pill a day.  While you are taking this avoid taking other NSAIDs such as motrin or aleve However heat, tylenol may also be helpful   Please let me know if you are not improving in the next week or so- Sooner if worse.

## 2016-08-30 LAB — POCT URINE PREGNANCY: Preg Test, Ur: NEGATIVE

## 2017-02-09 ENCOUNTER — Encounter: Payer: Self-pay | Admitting: Family Medicine

## 2017-02-09 ENCOUNTER — Ambulatory Visit (INDEPENDENT_AMBULATORY_CARE_PROVIDER_SITE_OTHER): Payer: BLUE CROSS/BLUE SHIELD | Admitting: Family Medicine

## 2017-02-09 VITALS — BP 118/80 | HR 95 | Temp 98.3°F | Ht 67.0 in | Wt 216.2 lb

## 2017-02-09 DIAGNOSIS — B9789 Other viral agents as the cause of diseases classified elsewhere: Secondary | ICD-10-CM

## 2017-02-09 DIAGNOSIS — J069 Acute upper respiratory infection, unspecified: Secondary | ICD-10-CM | POA: Diagnosis not present

## 2017-02-09 DIAGNOSIS — H6983 Other specified disorders of Eustachian tube, bilateral: Secondary | ICD-10-CM

## 2017-02-09 MED ORDER — GUAIFENESIN-CODEINE 100-10 MG/5ML PO SYRP: 5.0000 mL | ORAL_SOLUTION | Freq: Three times a day (TID) | ORAL | 0 refills | Status: DC | PRN

## 2017-02-09 MED ORDER — FLUTICASONE PROPIONATE 50 MCG/ACT NA SUSP: 2.0000 | Freq: Every day | NASAL | 2 refills | Status: DC

## 2017-02-09 MED ORDER — BENZONATATE 100 MG PO CAPS: 100.0000 mg | ORAL_CAPSULE | Freq: Three times a day (TID) | ORAL | 0 refills | Status: DC | PRN

## 2017-02-09 NOTE — Progress Notes (Signed)
Pre visit review using our clinic review tool, if applicable. No additional management support is needed unless otherwise documented below in the visit note. 

## 2017-02-09 NOTE — Patient Instructions (Addendum)
Continue to push fluids, practice good hand hygiene, and cover your mouth if you cough.  If you start having fevers, shaking or shortness of breath, seek immediate care.  Send me a MyChart message if you aren't better by 8/20.

## 2017-02-09 NOTE — Progress Notes (Signed)
Chief Complaint  Patient presents with  . Sinusitis    Katherine [name] is 26 y.o. female here for possible sinus infection.  Duration: 4 days Symptoms include: nasal congestion, clear rhinorrhea, cough, sore throats (getting better), sinus pressure, ear pain Denies: sneezing, fevers, facial pain, itchy eyes, dental pain, rigors Treatment to date: Nyquil, Dayquil Sick contacts? No- she did recently re  ROS:  Const: Denies fevers HEENT: As noted in HPI  Past Medical History:  Diagnosis Date  . Asthma   . Elevated blood pressure reading   . Frequent headaches   . Recurrent UTI    Social History   Social History  . Marital status: Single   Occupational History  . CSR    Social History Main Topics  . Smoking status: Never Smoker  . Smokeless tobacco: Never Used  . Alcohol use 1.2 - 1.8 oz/week    2 - 3 Standard drinks or equivalent per week     Comment: occassionally  . Drug use: No  . Sexual activity: Yes   Family History  Problem Relation Age of Onset  . Hypertension Father   . Hypertension Brother     BP 118/80 (BP Location: Left Arm, Patient Position: Sitting, Cuff Size: Normal)   Pulse 95   Temp 98.3 F (36.8 C) (Oral)   Ht 5\' 7"  (1.702 m)   Wt 216 lb 4 oz (98.1 kg)   SpO2 97%   BMI 33.87 kg/m  Gen- Awake, alert, appears stated age HEENT- Ears patent, TM's neg on R, , nares are patent, sinuses are not TTP, MMM, pharynx is not erythematous or with exudate Heart- RRR, no murmur, no bruits Lungs- CTAB, no accessory muscle use Psych- Age appropriate judgment and insight, nml mood and affect  Viral URI with cough - Plan: benzonatate (TESSALON) 100 MG capsule, guaiFENesin-codeine (ROBITUSSIN AC) 100-10 MG/5ML syrup  Dysfunction of both eustachian tubes - Plan: fluticasone (FLONASE) 50 MCG/ACT nasal spray  Orders as above. Discussed abx use and that this is likely viral. If no better by 8/20, send me a MyChart message.  [facility] to help with ETD, she would  prefer to avoid PO steroids.  Do not take cough syrup during day.  Continue to practice good hand hygiene, push fluids, and cover mouth when coughing. F/u prn. The patient voiced understanding and agreement to the plan.  Jilda Rocheicholas Paul [place], DO 02/09/17 2:07 PM
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
## 2017-06-01 ENCOUNTER — Encounter: Payer: Self-pay | Admitting: Family Medicine

## 2017-06-01 ENCOUNTER — Ambulatory Visit: Payer: BLUE CROSS/BLUE SHIELD | Admitting: Family Medicine

## 2017-06-01 VITALS — BP 124/90 | HR 84 | Temp 97.7°F | Ht 67.0 in | Wt 209.4 lb

## 2017-06-01 DIAGNOSIS — N926 Irregular menstruation, unspecified: Secondary | ICD-10-CM | POA: Diagnosis not present

## 2017-06-01 DIAGNOSIS — E663 Overweight: Secondary | ICD-10-CM | POA: Diagnosis not present

## 2017-06-01 DIAGNOSIS — R5383 Other fatigue: Secondary | ICD-10-CM | POA: Diagnosis not present

## 2017-06-01 DIAGNOSIS — L680 Hirsutism: Secondary | ICD-10-CM | POA: Diagnosis not present

## 2017-06-01 LAB — POCT URINE PREGNANCY: Preg Test, Ur: NEGATIVE

## 2017-06-01 NOTE — Patient Instructions (Signed)
It was nice to see you today- best wishes for your upcoming marriage!  Please come in for labs at your convenience and we will look for any sign of a thyroid issue, or PCOS

## 2017-06-02 ENCOUNTER — Other Ambulatory Visit (INDEPENDENT_AMBULATORY_CARE_PROVIDER_SITE_OTHER): Payer: BLUE CROSS/BLUE SHIELD

## 2017-06-02 DIAGNOSIS — N926 Irregular menstruation, unspecified: Secondary | ICD-10-CM

## 2017-06-02 LAB — COMPREHENSIVE METABOLIC PANEL
ALT: 13 U/L (ref 0–35)
AST: 14 U/L (ref 0–37)
Albumin: 4.4 g/dL (ref 3.5–5.2)
Alkaline Phosphatase: 56 U/L (ref 39–117)
BUN: 13 mg/dL (ref 6–23)
CO2: 28 mEq/L (ref 19–32)
Calcium: 9.6 mg/dL (ref 8.4–10.5)
Chloride: 106 mEq/L (ref 96–112)
Creatinine, Ser: 0.71 mg/dL (ref 0.40–1.20)
GFR: 104.84 mL/min (ref >60.00)
Glucose, Bld: 96 mg/dL (ref 70–99)
Potassium: 4 mEq/L (ref 3.5–5.1)
Sodium: 141 mEq/L (ref 135–145)
Total Bilirubin: 0.5 mg/dL (ref 0.2–1.2)
Total Protein: 7.3 g/dL (ref 6.0–8.3)

## 2017-06-02 LAB — CBC
HCT: 42.4 % (ref 36.0–46.0)
Hemoglobin: 14.4 g/dL (ref 12.0–15.0)
MCHC: 33.9 g/dL (ref 30.0–36.0)
MCV: 89.6 fl (ref 78.0–100.0)
Platelets: 257 10*3/uL (ref 150.0–400.0)
RBC: 4.73 Mil/uL (ref 3.87–5.11)
RDW: 13.5 % (ref 11.5–15.5)
WBC: 6.6 10*3/uL (ref 4.0–10.5)

## 2017-06-02 LAB — HEMOGLOBIN A1C: Hgb A1c MFr Bld: 5.2 % (ref 4.6–6.5)

## 2017-06-02 LAB — TESTOSTERONE: Testosterone: 80.18 ng/dL — ABNORMAL HIGH (ref 15.00–40.00)

## 2017-06-02 LAB — TSH: TSH: 2.62 u[IU]/mL (ref 0.35–4.50)

## 2017-06-02 LAB — LUTEINIZING HORMONE: LH: 14.96 m[IU]/mL

## 2017-06-02 LAB — FOLLICLE STIMULATING HORMONE: FSH: 8.3 m[IU]/mL

## 2017-06-03 LAB — PROLACTIN: Prolactin: 31.6 ng/mL — ABNORMAL HIGH

## 2017-06-06 ENCOUNTER — Encounter: Payer: Self-pay | Admitting: Family Medicine

## 2017-06-15 DIAGNOSIS — Z124 Encounter for screening for malignant neoplasm of cervix: Secondary | ICD-10-CM | POA: Diagnosis not present

## 2017-08-01 ENCOUNTER — Ambulatory Visit (INDEPENDENT_AMBULATORY_CARE_PROVIDER_SITE_OTHER): Payer: BLUE CROSS/BLUE SHIELD | Admitting: Internal Medicine

## 2017-08-01 ENCOUNTER — Encounter: Payer: Self-pay | Admitting: Internal Medicine

## 2017-08-01 ENCOUNTER — Ambulatory Visit: Payer: Self-pay

## 2017-08-01 VITALS — BP 118/80 | HR 101 | Temp 98.5°F | Resp 14 | Ht 67.0 in | Wt 213.2 lb

## 2017-08-01 DIAGNOSIS — J029 Acute pharyngitis, unspecified: Secondary | ICD-10-CM | POA: Diagnosis not present

## 2017-08-01 LAB — POCT RAPID STREP A (OFFICE): Rapid Strep A Screen: NEGATIVE

## 2017-08-01 MED ORDER — AMOXICILLIN 875 MG PO TABS: 875.0000 mg | ORAL_TABLET | Freq: Two times a day (BID) | ORAL | 0 refills | Status: DC

## 2017-08-01 NOTE — Telephone Encounter (Signed)
FYI

## 2017-08-01 NOTE — Telephone Encounter (Signed)
Pt calling c/o sore throat for 2 days. States pain is moderate and states tonsils are very swollen. Pt is able to drink without difficulty and states she is having no fever at present. Care advice given. Appointment made for today with Dr. Drue NovelPaz at 2:40 pm.  Reason for Disposition . [1] Sore throat is the only symptom AND [2] present > 48 hours  Answer Assessment - Initial Assessment Questions 1. ONSET: "When did the throat start hurting?" (Hours or days ago)      yesterday 2. SEVERITY: "How bad is the sore throat?" (Scale 1-10; mild, moderate or severe)   - MILD (1-3):  doesn't interfere with eating or normal activities   - MODERATE (4-7): interferes with eating some solids and normal activities   - SEVERE (8-10):  excruciating pain, interferes with most normal activities   - SEVERE DYSPHAGIA: can't swallow liquids, drooling     moderate 3. STREP EXPOSURE: "Has there been any exposure to strep within the past week?" If so, ask: "What type of contact occurred?"      no 4.  VIRAL SYMPTOMS: "Are there any symptoms of a cold, such as a runny nose, cough, hoarse voice or red eyes?"      Head congestion, states that nose/sinuses feel dry 5. FEVER: "Do you have a fever?" If so, ask: "What is your temperature, how was it measured, and when did it start?"     Has not checked temp but states doesn't feel like she has a fever 6. PUS ON THE TONSILS: "Is there pus on the tonsils in the back of your throat?"     No pus but tonsils are very swollen 7. OTHER SYMPTOMS: "Do you have any other symptoms?" (e.g., difficulty breathing, headache, rash)     Having a itch all over body but no rash. 8. PREGNANCY: "Is there any chance you are pregnant?" "When was your last menstrual period?"     No- On menses  Protocols used: SORE THROAT-A-AH

## 2017-08-01 NOTE — Progress Notes (Signed)
Subjective:    Patient ID: Katherine DarterRebekah D [name], female    DOB: 04/13/1991, 27 y.o.   MRN: [id number]  DOS:  08/01/2017 Type of visit - description : acute Interval history: Had a URI last week, was getting better gradually but suddenly last night started to have sore throat again. This morning the sore throat was increased, currently has moderate pain. She also noted some throat swelling.   Review of Systems  Denies recent fever, chills. No cough No nausea, vomiting. Feeling slightly achy today. Denies any rash but last week had a day or 2 when  she felt itchy all over, wonders if related to OTC vitamins for PCOS that she is taking.  (She also took OTC medications for the URI) , advised I can't determined that, observation recommended   Past Medical History:  Diagnosis Date  . Asthma   . Elevated blood pressure reading   . Frequent headaches   . Recurrent UTI     No past surgical history on file.  Social History   Socioeconomic History  . Marital status: Single    Spouse name: Not on file  . Number of children: Not on file  . Years of education: Not on file  . Highest education level: Not on file  Social Needs  . Financial resource strain: Not on file  . Food insecurity - worry: Not on file  . Food insecurity - inability: Not on file  . Transportation needs - medical: Not on file  . Transportation needs - non-medical: Not on file  Occupational History  . Occupation: CSR  Tobacco Use  . Smoking status: Never Smoker  . Smokeless tobacco: Never Used  Substance and Sexual Activity  . Alcohol use: Yes    Alcohol/week: 1.2 - 1.8 oz    Types: 2 - 3 Standard drinks or equivalent per week    Comment: occassionally  . Drug use: No  . Sexual activity: Yes  Other Topics Concern  . Not on file  Social History Narrative  . Not on file      Allergies as of 08/01/2017      Reactions   Prednisone Other (See Comments)   Pt states "It makes me feel weird.        Medication List        Accurate as of 08/01/17  2:51 PM. Always use your most recent med list.          benzonatate 100 MG capsule Commonly known as:  TESSALON Take 1 capsule (100 mg total) by mouth 3 (three) times daily as needed.   fluticasone 50 MCG/ACT nasal spray Commonly known as:  FLONASE Place 2 sprays into both nostrils daily.   guaiFENesin-codeine 100-10 MG/5ML syrup Commonly known as:  ROBITUSSIN AC Take 5 mLs by mouth 3 (three) times daily as needed for cough.   ibuprofen 200 MG tablet Commonly known as:  ADVIL,MOTRIN Take 200 mg by mouth every 6 (six) hours as needed.          Objective:   Physical Exam BP 118/80 (BP Location: Left Arm, Patient Position: Sitting, Cuff Size: Small)   Pulse (!) 101   Temp 98.5 F (36.9 C) (Oral)   Resp 14   Ht 5\' 7"  (1.702 m)   Wt 213 lb 4 oz (96.7 kg)   SpO2 97%   BMI 33.40 kg/m  General:   Well developed, well nourished . NAD.  HEENT:  Normocephalic . Face symmetric, atraumatic. TMs normal, throat symmetric, +  redness, tonsils small, no discharge Neck: Few, small LADs, not particularly tender Lungs:  CTA B Normal respiratory effort, no intercostal retractions, no accessory muscle use. Heart: RRR,  no murmur.  No pretibial edema bilaterally  Skin: Not pale. Not jaundice Neurologic:  alert & oriented X3.  Speech normal, gait appropriate for age and unassisted Psych--  Cognition and judgment appear intact.  Cooperative with normal attention span and concentration.  Behavior appropriate. No anxious or depressed appearing.      Assessment & Plan:    27 year old female h/o PCOS not on birth control pills, LMP 3 days ago.  Presents with: Pharyngitis: Findings consistent with pharyngitis, rapid strep test negative.  We will get a throat culture, start empiric amoxicillin.  Supportive treatment, see AVS.

## 2017-08-01 NOTE — Patient Instructions (Addendum)
Rest, fluids , tylenol  If  cough:  Take Mucinex DM twice a day as needed until better  If  nasal congestion: Use OTC Nasocort or Flonase : 2 nasal sprays on each side of the nose in the morning until you feel better     Take the antibiotic as prescribed  (Amoxicillin)  Call if not gradually better over the next  10 days  Call anytime if the symptoms are severe  

## 2017-08-01 NOTE — Telephone Encounter (Signed)
thx

## 2017-08-01 NOTE — Progress Notes (Signed)
Pre visit review using our clinic review tool, if applicable. No additional management support is needed unless otherwise documented below in the visit note. 

## 2017-08-02 LAB — STREP A DNA PROBE: Group A Strep Probe: NOT DETECTED

## 2018-04-03 NOTE — Progress Notes (Signed)
[facility] [address], [address] [place], [place] [address] [phone]  Date:  04/05/2018   Name:  Katherine [name]   DOB:  26-Sep-1990   MRN:  [id number]  PCP:  Pearline Cables, MD    Chief Complaint: Anxiety (onoing issue, went to counseling, very overwhelmed with work)   History of Present Illness:  Katherine [name] is a 27 y.o. very pleasant female patient who presents with the following:  Here today to discuss her anxiety  I last saw her in November at which time she was getting married very soon - all going well, and her sister had a baby. It has been a good year We diagnosed PCOS at that time but she did not wish to start treatment  Pt notes that anxiety has been an issues for a long time. She tried some counseling which she did like but it was too time consuming.  She works full time and had a business, in addition to taking care of the house, etc.   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   She may have a CP when she is anxious or stressed She does not get these when she is not stressed, does not occur with exercise.  She has not used any meds for anxiety except for what she borrowed from her husband, which sounds like a benzo No SI   She is trying to exercise more   . Wt Readings from Last 3 Encounters:  04/05/18 220 lb (99.8 kg)  08/01/17 213 lb  4 oz (96.7 kg)  06/01/17 209 lb 6.4 oz (95 kg)     Patient Active Problem List   Diagnosis Date Noted  . Overweight 01/14/2016    Past Medical History:  Diagnosis Date  . Asthma   . Elevated blood pressure reading   . Frequent headaches   . Recurrent UTI     No past surgical history on file.  Social History   Tobacco Use  . Smoking status: Never Smoker  . Smokeless tobacco: Never Used  Substance Use Topics  . Alcohol use: Yes    Alcohol/week: 2.0 - 3.0 standard drinks    Types: 2 - 3 Standard drinks or equivalent per week    Comment: occassionally  . Drug use: No    Family History  Problem Relation Age of Onset  . Hypertension Father   . Hypertension Brother     Allergies  Allergen Reactions  . Prednisone Other (See Comments)    Pt states "It makes me feel weird.    Medication list has been reviewed and updated.  No current outpatient medications on file prior to visit.   No current facility-administered medications on file prior to visit.  Review of Systems:  As per HPI- otherwise negative. No fever or chills No CP or SOB   Physical Examination: Vitals:   04/05/18 1718  BP: 118/80  Pulse: 70  Resp: 18  Temp: 98.1 F (36.7 C)  SpO2: 98%   Vitals:   04/05/18 1718  Weight: 220 lb (99.8 kg)  Height: 5\' 7"  (1.702 m)   Body mass index is 34.46 kg/m. Ideal Body Weight: Weight in (lb) to have BMI = 25: 159.3  GEN: WDWN, NAD, Non-toxic, A & O x 3, obese, looks well  HEENT: Atraumatic, Normocephalic. Neck supple. No masses, No LAD. Bilateral TM wnl, oropharynx normal.  PEERL,EOMI.   Ears and Nose: No external deformity. CV: RRR, No M/G/R. No JVD. No thrill. No extra heart sounds. PULM: CTA B, no wheezes, crackles, rhonchi. No retractions. No resp. distress. No accessory muscle use. ABD: S, NT, ND, +BS. No rebound. No HSM. EXTR: No c/c/e NEURO Normal gait.  PSYCH: Normally interactive. Conversant. Not depressed or anxious appearing.  Calm  demeanor.   EKG: SR, no concerning ST changes  Assessment and Plan: Anxiety - Plan: sertraline (ZOLOFT) 50 MG tablet  Chest pain, unspecified type - Plan: EKG 12-Lead  Here today to discuss anxiety She has not used any medication in the past but her sister recently responded well to zoloft  Will start on zoloft 50 mg She is not trying to get pregnant but is she were to conceive zoloft is likely safe to continue  Chest pains are due to anxiety-   Signed Abbe Amsterdam, MD

## 2018-04-05 ENCOUNTER — Ambulatory Visit (INDEPENDENT_AMBULATORY_CARE_PROVIDER_SITE_OTHER): Payer: BLUE CROSS/BLUE SHIELD | Admitting: Family Medicine

## 2018-04-05 ENCOUNTER — Encounter: Payer: Self-pay | Admitting: Family Medicine

## 2018-04-05 VITALS — BP 118/80 | HR 70 | Temp 98.1°F | Resp 18 | Ht 67.0 in | Wt 220.0 lb

## 2018-04-05 DIAGNOSIS — R079 Chest pain, unspecified: Secondary | ICD-10-CM

## 2018-04-05 DIAGNOSIS — F419 Anxiety disorder, unspecified: Secondary | ICD-10-CM

## 2018-04-05 MED ORDER — SERTRALINE HCL 50 MG PO TABS: 50.0000 mg | ORAL_TABLET | Freq: Every day | ORAL | 5 refills | Status: DC

## 2018-04-05 NOTE — Patient Instructions (Addendum)
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 We will try 50 mg of zoloft for you-sertraline. Let me know how this works- we can go up to 200 mg if need be   Please see me in about 8 weeks for a recheck

## 2018-05-01 ENCOUNTER — Ambulatory Visit: Payer: Self-pay | Admitting: *Deleted

## 2018-05-01 NOTE — Telephone Encounter (Signed)
Pt reports increased fatigue after 2nd week on Zoloft. States she takes at [facility]; "Feel out of it all day, so sleepy." States is able to work. Also reports has taken Nyquil past 3 nights and fatigue worsened. States her nephew has a virus and she may be coming down with something. Advised pt to take non-alcohol containing OTC cough medication and to consult with pharmacist.  Re: Zoloft, pt made aware I would alert Dr. Patsy Lager as pt is concerned she is not tolerating Zoloft 50mg .   Please advise: [phone]  Reason for Disposition . Caller has NON-URGENT medication question about med that PCP prescribed and triager unable to answer question  Answer Assessment - Initial Assessment Questions 1. SYMPTOMS: "Do you have any symptoms?"    Increased fatigue 2. SEVERITY: If symptoms are present, ask "Are they mild, moderate or severe?"     Mild to moderate, lingers through out day.  Protocols used: MEDICATION QUESTION CALL-A-AH

## 2018-05-02 ENCOUNTER — Encounter: Payer: Self-pay | Admitting: Family Medicine

## 2018-05-02 NOTE — Telephone Encounter (Signed)
Message to pt.

## 2018-05-03 ENCOUNTER — Encounter: Payer: Self-pay | Admitting: Family Medicine

## 2018-05-03 ENCOUNTER — Ambulatory Visit (INDEPENDENT_AMBULATORY_CARE_PROVIDER_SITE_OTHER): Payer: BLUE CROSS/BLUE SHIELD | Admitting: Family Medicine

## 2018-05-03 VITALS — BP 108/70 | HR 116 | Temp 98.4°F | Ht 67.0 in | Wt 219.5 lb

## 2018-05-03 DIAGNOSIS — J0100 Acute maxillary sinusitis, unspecified: Secondary | ICD-10-CM

## 2018-05-03 MED ORDER — METHYLPREDNISOLONE 4 MG PO TBPK: ORAL_TABLET | ORAL | 0 refills | Status: DC

## 2018-05-03 NOTE — Progress Notes (Signed)
Chief Complaint  Patient presents with  . Nasal Congestion    chest     Katherine [name] here for URI complaints.  Duration: 4 days  Associated symptoms: sinus congestion, rhinorrhea, ear fullness, wheezing, shortness of breath, myalgia and fevers Denies: sinus pain, itchy watery eyes, ear pain, ear drainage, sore throat and fevers Treatment to date: Nyquil, ibuprofen, Robitussin Sick contacts: Yes- nephew recently ill   ROS:  Const: Denies fevers HEENT: As noted in HPI Lungs: +sob  Past Medical History:  Diagnosis Date  . Asthma   . Elevated blood pressure reading   . Frequent headaches   . Recurrent UTI     BP 108/70 (BP Location: Left Arm, Patient Position: Sitting, Cuff Size: Large)   Pulse (!) 116   Temp 98.4 F (36.9 C) (Oral)   Ht 5\' 7"  (1.702 m)   Wt 219 lb 8 oz (99.6 kg)   SpO2 97%   BMI 34.38 kg/m  General: Awake, alert, appears stated age HEENT: AT, [place], ears patent b/l and TM's neg, +ttp over max sinuses b/l, nares patent w/o discharge, pharynx pink and without exudates, MMM Neck: No masses or asymmetry Heart: RRR Lungs: CTAB, no accessory muscle use Psych: Age appropriate judgment and insight, normal mood and affect  Acute maxillary sinusitis, recurrence not specified - Plan: methylPREDNISolone (MEDROL DOSEPAK) 4 MG TBPK tablet  Orders as above. Will cover for allergies, start PO antihistamine. If no improvement by Mon, let me know and will call in Augmentin.  Continue to push fluids, practice good hand hygiene, cover mouth when coughing. F/u prn. If starting to experience fevers, shaking, or shortness of breath, seek immediate care. Pt voiced understanding and agreement to the plan.  Katherine [name] [place], DO 05/03/18 2:32 PM

## 2018-05-03 NOTE — Progress Notes (Signed)
Pre visit review using our clinic review tool, if applicable. No additional management support is needed unless otherwise documented below in the visit note. 

## 2018-05-03 NOTE — Patient Instructions (Signed)
Continue to push fluids, practice good hand hygiene, and cover your mouth if you cough.  If you start having fevers, shaking or shortness of breath, seek immediate care.  Send me a MyChart message on Monday if you are not feeling better.  Let us know if you need anything.

## 2018-08-02 ENCOUNTER — Ambulatory Visit: Payer: BLUE CROSS/BLUE SHIELD | Admitting: Family Medicine

## 2018-08-05 NOTE — Progress Notes (Signed)
[place] at [facility] [address], [place], [place] [address] [phone]  Date:  08/09/2018   Name:  Katherine [name]   DOB:  August 11, 1990   MRN:  [id number]  PCP:  Darreld Mclean, MD    Chief Complaint: Medication Management (increase zoloft? increase anxiety, trouble sleeping)   History of Present Illness:  Katherine [name] is a 28 y.o. very pleasant female patient who presents with the following:  Last seen by myself in October to discuss anxiety, we started her on Zoloft. She is currently taking 50 mg This has helped her some, but she continues to suffer from anxiety.  Depression is not a major concern.  No suicidal ideation.  She notes her older sister uses Zoloft with good results She continues to notice issues with sleep; she has tried over-the-counter melatonin, but does not really help Her dog died a few weeks ago.  She was only 40 years old and apparently ate something toxic, got very sick and had to be put down.  This has not improved her sx   Patient and her husband are also currently trying to conceive.  They just started trying this month, she does have PCOS and suspects it may take a while to become pregnant.  However she was pleased that her over-the-counter ovulation kit did indicate that she ovulated this month Pap- she is getting one next week, is meeting her new OB/GYN.  Her last doctor no longer delivers babies  We discussed some general advice involving pregnancy, such as prenatal vitamins.  Encouraged a flu shot- she declines today but agrees to get one if she becomes pregnant   Patient Active Problem List   Diagnosis Date Noted  . Overweight 01/14/2016    Past Medical History:  Diagnosis Date  . Asthma   . Elevated blood pressure reading   . Frequent headaches   . Recurrent UTI     History reviewed. No pertinent surgical history.  Social History   Tobacco Use  . Smoking status: Never  Smoker  . Smokeless tobacco: Never Used  Substance Use Topics  . Alcohol use: Yes    Alcohol/week: 2.0 - 3.0 standard drinks    Types: 2 - 3 Standard drinks or equivalent per week    Comment: occassionally  . Drug use: No    Family History  Problem Relation Age of Onset  . Hypertension Father   . Hypertension Brother     Allergies  Allergen Reactions  . Prednisone Other (See Comments)    Pt states "It makes me feel weird.    Medication list has been reviewed and updated.  Current Outpatient Medications on File Prior to Visit  Medication Sig Dispense Refill  . sertraline (ZOLOFT) 50 MG tablet Take 1 tablet (50 mg total) by mouth daily. 30 tablet 5   No current facility-administered medications on file prior to visit.     Review of Systems:  As per HPI- otherwise negative.  No fever or chills, no chest pain or shortness of breath Physical Examination: Vitals:   08/09/18 1720  BP: 118/80  Pulse: 80  Resp: 16  Temp: 98.4 F (36.9 C)  SpO2: 99%   Vitals:   08/09/18 1720  Weight: 225 lb (102.1 kg)  Height: _0  (1.702 m)   Body mass index is 35.24 kg/m. Ideal Body Weight: Weight in (lb) to have BMI = 25: 159.3  GEN: WDWN, NAD, Non-toxic, A &  O x 3, overweight, looks well HEENT: Atraumatic, Normocephalic. Neck supple. No masses, No LAD. Ears and Nose: No external deformity. CV: RRR, No M/G/R. No JVD. No thrill. No extra heart sounds. PULM: CTA B, no wheezes, crackles, rhonchi. No retractions. No resp. distress. No accessory muscle use. EXTR: No c/c/e NEURO Normal gait.  PSYCH: Normally interactive. Conversant. Not depressed or anxious appearing.  Calm demeanor.    Assessment and Plan: Anxiety - Plan: sertraline (ZOLOFT) 100 MG tablet  Encounter for preconception consultation  Here today to discuss her Zoloft.  She is currently taking 50 mg, but notes that she continues to have some anxiety.  She would like to increase the dose, but wonders about safety  in pregnancy.  We read over the available research on up-to-date, which concludes that there is not a significantly increased risk of fetal harm with use of SSRIs, particularly at the beginning of pregnancy.  However there can be newborn withdrawal symptoms, so a decrease or cessation of medication may be prudent later in pregnancy  We will increase her sertraline to 100 mg, she will let me know how this works for her  We discussed other prepregnancy recommendations, including prenatal vitamins.  We discussed other supplements such as [place]  She declined a flu shot today, but states she will get one if she becomes pregnant I have advised her of increased risk of serious flu illness in pregnant persons   Signed Lamar Blinks, MD

## 2018-08-09 ENCOUNTER — Ambulatory Visit (INDEPENDENT_AMBULATORY_CARE_PROVIDER_SITE_OTHER): Payer: BLUE CROSS/BLUE SHIELD | Admitting: Family Medicine

## 2018-08-09 ENCOUNTER — Encounter: Payer: Self-pay | Admitting: Family Medicine

## 2018-08-09 VITALS — BP 118/80 | HR 80 | Temp 98.4°F | Resp 16 | Ht 67.0 in | Wt 225.0 lb

## 2018-08-09 DIAGNOSIS — F419 Anxiety disorder, unspecified: Secondary | ICD-10-CM

## 2018-08-09 DIAGNOSIS — Z3169 Encounter for other general counseling and advice on procreation: Secondary | ICD-10-CM

## 2018-08-09 MED ORDER — SERTRALINE HCL 100 MG PO TABS: 100.0000 mg | ORAL_TABLET | Freq: Every day | ORAL | 2 refills | Status: DC

## 2018-08-09 NOTE — Patient Instructions (Signed)
We will increase your sertraline to 100mg .  As we discussed, the available evidence has not shown increased risk to a developing fetus with this medication.  However, you may need to taper or stop it towards the end of pregnancy to avoid withdrawal in the newborn  Continue taking your prenatal vitamins, I would suggest the book "what to expect when you are expecting" for lots of other good advice  Let me know if you have any questions or concerns, or the increased dose of sertraline does not seem to be helping  You might try over-the-counter doxylamine, this is useful for sleep

## 2018-08-21 ENCOUNTER — Encounter: Payer: Self-pay | Admitting: Family Medicine

## 2018-09-12 ENCOUNTER — Telehealth: Payer: BLUE CROSS/BLUE SHIELD | Admitting: Nurse Practitioner

## 2018-09-12 ENCOUNTER — Other Ambulatory Visit: Payer: Self-pay

## 2018-09-12 ENCOUNTER — Ambulatory Visit (INDEPENDENT_AMBULATORY_CARE_PROVIDER_SITE_OTHER): Payer: BLUE CROSS/BLUE SHIELD | Admitting: Family Medicine

## 2018-09-12 ENCOUNTER — Encounter: Payer: Self-pay | Admitting: Family Medicine

## 2018-09-12 VITALS — BP 124/90 | HR 87 | Temp 98.0°F | Ht 67.0 in | Wt 227.0 lb

## 2018-09-12 DIAGNOSIS — N3000 Acute cystitis without hematuria: Secondary | ICD-10-CM

## 2018-09-12 DIAGNOSIS — N926 Irregular menstruation, unspecified: Secondary | ICD-10-CM

## 2018-09-12 DIAGNOSIS — R300 Dysuria: Secondary | ICD-10-CM

## 2018-09-12 LAB — POCT URINALYSIS DIPSTICK
Glucose, UA: NEGATIVE
Ketones, UA: NEGATIVE
Nitrite, UA: POSITIVE
Protein, UA: POSITIVE — AB
Spec Grav, UA: 1.015 (ref 1.010–1.025)
Urobilinogen, UA: 2 E.U./dL — AB
pH, UA: 8 (ref 5.0–8.0)

## 2018-09-12 LAB — POCT URINE PREGNANCY: Preg Test, Ur: NEGATIVE

## 2018-09-12 MED ORDER — NITROFURANTOIN MONOHYD MACRO 100 MG PO CAPS: 100.0000 mg | ORAL_CAPSULE | Freq: Two times a day (BID) | ORAL | 0 refills | Status: AC

## 2018-09-12 NOTE — Progress Notes (Signed)
Katherine [name] is a 28 y.o. female  Chief Complaint  Patient presents with  . Urinary Tract Infection    lower left pelvic pain, urgency, painful urination, left lower back pain/ 3 days     HPI: Katherine [name] is a 28 y.o. female complains of 3 days of dysuria, urinary urgency and frequency, Lt lower abdominal/pelvic pain, Lt back pain. No fever, chills. No nausea or vomiting. No vaginal itching, discharge, odor.  She has a h/o UTIs last one was a couple of years ago.   Pt requests urine pregnancy test. She states she and her husband are TTC and she wants to ensure she is not pregnant prior to taking any medications. She understands urine pregnancy test could be falsely negative very early in pregnancy.   Past Medical History:  Diagnosis Date  . Asthma   . Elevated blood pressure reading   . Frequent headaches   . Recurrent UTI     History reviewed. No pertinent surgical history.  Social History   Socioeconomic History  . Marital status: Married    Spouse name: Not on file  . Number of children: Not on file  . Years of education: Not on file  . Highest education level: Not on file  Occupational History  . Occupation: CSR  Social Needs  . Financial resource strain: Not on file  . Food insecurity:    Worry: Not on file    Inability: Not on file  . Transportation needs:    Medical: Not on file    Non-medical: Not on file  Tobacco Use  . Smoking status: Never Smoker  . Smokeless tobacco: Never Used  Substance and Sexual Activity  . Alcohol use: Yes    Alcohol/week: 2.0 - 3.0 standard drinks    Types: 2 - 3 Standard drinks or equivalent per week    Comment: occassionally  . Drug use: No  . Sexual activity: Yes  Lifestyle  . Physical activity:    Days per week: Not on file    Minutes per session: Not on file  . Stress: Not on file  Relationships  . Social connections:    Talks on phone: Not on file    Gets together: Not on file    Attends religious  service: Not on file    Active member of club or organization: Not on file    Attends meetings of clubs or organizations: Not on file    Relationship status: Not on file  . Intimate partner violence:    Fear of current or ex partner: Not on file    Emotionally abused: Not on file    Physically abused: Not on file    Forced sexual activity: Not on file  Other Topics Concern  . Not on file  Social History Narrative  . Not on file    Family History  Problem Relation Age of Onset  . Hypertension Father   . Hypertension Brother       There is no immunization history on file for this patient.  Outpatient Encounter Medications as of 09/12/2018  Medication Sig  . sertraline (ZOLOFT) 100 MG tablet Take 1 tablet (100 mg total) by mouth daily.  . nitrofurantoin, macrocrystal-monohydrate, (MACROBID) 100 MG capsule Take 1 capsule (100 mg total) by mouth 2 (two) times daily for 5 days.   No facility-administered encounter medications on file as of 09/12/2018.      ROS: Pertinent positives and negatives noted in HPI. Remainder of ROS  non-contributory    Allergies  Allergen Reactions  . Prednisone Other (See Comments)    Pt states "It makes me feel weird.    BP 124/90   Pulse 87   Temp 98 F (36.7 C) (Oral)   Ht 5\' 7"  (1.702 m)   Wt 227 lb (103 kg)   SpO2 97%   BMI 35.55 kg/m   Physical Exam  Constitutional: She is oriented to person, place, and time. She appears well-developed and well-nourished. No distress.  Abdominal: Soft. Bowel sounds are normal. She exhibits no distension and no mass. There is abdominal tenderness in the suprapubic area. There is no rebound, no guarding and no CVA tenderness.  Neurological: She is alert and oriented to person, place, and time.  Psychiatric: She has a normal mood and affect. Her behavior is normal.   Component     Latest Ref Rng & Units 09/12/2018  Color, UA      orange  Clarity, UA      clear  Glucose     Negative Negative   Bilirubin, UA      2+  Ketones, UA      neg  Specific Gravity, UA     1.010 - 1.025 1.015  RBC, UA      1+  pH, UA     5.0 - 8.0 8.0  Protein, UA     Negative Positive (A)  Urobilinogen, UA     0.2 or 1.0 E.U./dL 2.0 (A)  Nitrite, UA      pos  Leukocytes, UA     Negative Moderate (2+) (A)    A/P:  1. Dysuria 2. Acute cystitis without hematuria - POCT Urinalysis Dipstick - Urine Culture - increase water intake Rx: - nitrofurantoin, macrocrystal-monohydrate, (MACROBID) 100 MG capsule; Take 1 capsule (100 mg total) by mouth 2 (two) times daily for 5 days.  Dispense: 10 capsule; Refill: 0 - f/u if symptoms worsen or do not improve in 7-10 days Discussed plan and reviewed medications with patient, including risks, benefits, and potential side effects. Pt expressed understand. All questions answered.  3. Missed period - POCT urine pregnancy - negative

## 2018-09-12 NOTE — Progress Notes (Signed)
Based on what you shared with me it looks like you have uti or possibly pyelonephritis due to back pain and abdominal pain,that should be evaluated in a face to face office visit. You need to have a urinalysis and urine culture to make sure you are treated properly   NOTE: If you entered your credit card information for this eVisit, you will not be charged. You may see a "hold" on your card for the $30 but that hold will drop off and you will not have a charge processed.  If you are having a true medical emergency please call 911.  If you need an urgent face to face visit, [facility] has four urgent care centers for your convenience.  If you need care fast and have a high deductible or no insurance consider:   [email or web address] to reserve your spot online an avoid wait times  [facility] [address] [place], [place] [address] 8 am to 8 pm Monday-Friday 10 am to 4 pm Saturday-Sunday *Across the street from [employer]  [address] [place], [address] 8 am to 5 pm Monday-Friday * In the [facility] on the [facility]   The following sites will take your  insurance:  . [facility]  [phone] Get Driving Directions Find a Provider at this Location  [address] [place], [place] [address] . 10 am to 8 pm Monday-Friday . 12 pm to 8 pm Saturday-Sunday   . [facility] at [facility]  [phone] Get Driving Directions Find a Provider at this Location  [address] [place], [place] [address] . 8 am to 8 pm Monday-Friday . 9 am to 6 pm Saturday . 11 am to 6 pm Sunday   . [facility] at [facility]  [phone] Get Driving Directions  [address] [place], [place] [address] . 8 am to 8 pm Monday-Friday . 8 am to 4 pm Saturday-Sunday   Your e-visit answers were reviewed by a board certified advanced clinical  practitioner to complete your personal care plan.  5 minutes spent reviewing and documenting in chart.

## 2018-09-14 LAB — URINE CULTURE
MICRO NUMBER:: 305798
SPECIMEN QUALITY:: ADEQUATE

## 2018-09-17 ENCOUNTER — Other Ambulatory Visit: Payer: Self-pay | Admitting: Family Medicine

## 2018-09-17 MED ORDER — CIPROFLOXACIN HCL 500 MG PO TABS: 500.0000 mg | ORAL_TABLET | Freq: Two times a day (BID) | ORAL | 0 refills | Status: AC

## 2018-10-25 DIAGNOSIS — Z01419 Encounter for gynecological examination (general) (routine) without abnormal findings: Secondary | ICD-10-CM | POA: Diagnosis not present

## 2018-10-25 DIAGNOSIS — Z3202 Encounter for pregnancy test, result negative: Secondary | ICD-10-CM | POA: Diagnosis not present

## 2018-10-25 DIAGNOSIS — Z6836 Body mass index (BMI) 36.0-36.9, adult: Secondary | ICD-10-CM | POA: Diagnosis not present

## 2018-11-16 ENCOUNTER — Encounter: Payer: Self-pay | Admitting: Family Medicine

## 2018-12-17 ENCOUNTER — Other Ambulatory Visit: Payer: Self-pay

## 2018-12-17 ENCOUNTER — Encounter: Payer: Self-pay | Admitting: Family Medicine

## 2018-12-17 ENCOUNTER — Ambulatory Visit (INDEPENDENT_AMBULATORY_CARE_PROVIDER_SITE_OTHER): Payer: BC Managed Care – PPO | Admitting: Family Medicine

## 2018-12-17 VITALS — Ht 67.0 in | Wt 238.0 lb

## 2018-12-17 DIAGNOSIS — M533 Sacrococcygeal disorders, not elsewhere classified: Secondary | ICD-10-CM | POA: Diagnosis not present

## 2018-12-17 MED ORDER — CYCLOBENZAPRINE HCL 10 MG PO TABS: 5.0000 mg | ORAL_TABLET | Freq: Three times a day (TID) | ORAL | 0 refills | Status: DC | PRN

## 2018-12-17 MED ORDER — MELOXICAM 15 MG PO TABS: 15.0000 mg | ORAL_TABLET | Freq: Every day | ORAL | 0 refills | Status: DC
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
## 2018-12-20 ENCOUNTER — Other Ambulatory Visit: Payer: Self-pay | Admitting: Family Medicine

## 2018-12-20 DIAGNOSIS — M533 Sacrococcygeal disorders, not elsewhere classified: Secondary | ICD-10-CM

## 2018-12-20 NOTE — Progress Notes (Signed)
Referral to sports med placed. MyChart message sent informing pt.

## 2018-12-24 ENCOUNTER — Ambulatory Visit (INDEPENDENT_AMBULATORY_CARE_PROVIDER_SITE_OTHER): Payer: BC Managed Care – PPO | Admitting: Family Medicine

## 2018-12-24 ENCOUNTER — Other Ambulatory Visit: Payer: Self-pay

## 2018-12-24 ENCOUNTER — Ambulatory Visit (HOSPITAL_BASED_OUTPATIENT_CLINIC_OR_DEPARTMENT_OTHER)
Admission: RE | Admit: 2018-12-24 | Discharge: 2018-12-24 | Disposition: A | Discharge status: Home / Self Care | Payer: BC Managed Care – PPO | Source: Ambulatory Visit | Attending: Family Medicine | Admitting: Family Medicine

## 2018-12-24 ENCOUNTER — Encounter: Payer: Self-pay | Admitting: Family Medicine

## 2018-12-24 VITALS — BP 125/95 | HR 111 | Temp 98.6°F | Resp 16 | Ht 67.0 in | Wt 238.1 lb

## 2018-12-24 DIAGNOSIS — M5441 Lumbago with sciatica, right side: Secondary | ICD-10-CM | POA: Diagnosis not present

## 2018-12-24 DIAGNOSIS — M545 Low back pain: Secondary | ICD-10-CM | POA: Diagnosis not present

## 2018-12-24 LAB — POCT URINE PREGNANCY: Preg Test, Ur: NEGATIVE

## 2018-12-24 MED ORDER — PREDNISONE 10 MG PO TABS: ORAL_TABLET | ORAL | 0 refills | Status: DC

## 2018-12-24 MED ORDER — METAXALONE 800 MG PO TABS: 800.0000 mg | ORAL_TABLET | Freq: Three times a day (TID) | ORAL | 0 refills | Status: DC

## 2018-12-24 NOTE — Progress Notes (Signed)
Patient ID: Katherine [name], female    DOB: 10/30/1990  Age: 28 y.o. MRN: [id number]    Subjective:  Subjective  HPI Katherine CornRebekah D Pohlmann presents for Low back pain on the L side x 3 weeks.   Since last Monday it has gotten worse.  She did a virtual visit last week and was given flexeril and meloxicam with little relief.  Review of Systems  Constitutional: Negative for appetite change, diaphoresis, fatigue and unexpected weight change.  Eyes: Negative for pain, redness and visual disturbance.  Respiratory: Negative for cough, chest tightness, shortness of breath and wheezing.   Cardiovascular: Negative for chest pain, palpitations and leg swelling.  Endocrine: Negative for cold intolerance, heat intolerance, polydipsia, polyphagia and polyuria.  Genitourinary: Negative for difficulty urinating, dysuria and frequency.  Musculoskeletal: Positive for back pain. Negative for neck pain and neck stiffness.  Neurological: Negative for dizziness, light-headedness, numbness and headaches.    History Past Medical History:  Diagnosis Date  . Asthma   . Elevated blood pressure reading   . Frequent headaches   . Recurrent UTI     She has no past surgical history on file.   Her family history includes Hypertension in her brother and father.She reports that she has never smoked. She has never used smokeless tobacco. She reports current alcohol use of about 2.0 - 3.0 standard drinks of alcohol per week. She reports that she does not use drugs.  Current Outpatient Medications on File Prior to Visit  Medication Sig Dispense Refill  . cyclobenzaprine (FLEXERIL) 10 MG tablet Take 0.5-1 tablets (5-10 mg total) by mouth 3 (three) times daily as needed for muscle spasms. 15 tablet 0  . meloxicam (MOBIC) 15 MG tablet Take 1 tablet (15 mg total) by mouth daily. 14 tablet 0  . Prenatal MV & Min w/FA-DHA (PRENATAL ADULT GUMMY/DHA/FA PO) Take by mouth.    . sertraline (ZOLOFT) 100 MG tablet Take 1 tablet  (100 mg total) by mouth daily. 90 tablet 2   No current facility-administered medications on file prior to visit.      Objective:  Objective  Physical Exam Vitals signs and nursing note reviewed.  Constitutional:      Appearance: She is well-developed.  HENT:     Head: Normocephalic and atraumatic.  Eyes:     Conjunctiva/sclera: Conjunctivae normal.  Neck:     Musculoskeletal: Normal range of motion and neck supple.     Thyroid: No thyromegaly.     Vascular: No carotid bruit or JVD.  Cardiovascular:     Rate and Rhythm: Normal rate and regular rhythm.     Heart sounds: Normal heart sounds. No murmur.  Pulmonary:     Effort: Pulmonary effort is normal. No respiratory distress.     Breath sounds: Normal breath sounds. No wheezing or rales.  Chest:     Chest wall: No tenderness.  Musculoskeletal:     Lumbar back: She exhibits tenderness, pain and spasm. She exhibits normal range of motion.  Neurological:     Mental Status: She is alert and oriented to person, place, and time.     Comments: Pain / numnbess radiates down L leg to toes     BP (!) 125/95 (BP Location: Left Arm, Patient Position: Sitting, Cuff Size: Normal)   Pulse (!) 111   Temp 98.6 F (37 C) (Oral)   Resp 16   Ht 5\' 7"  (1.702 m)   Wt 238 lb 2 oz (108 kg)   LMP 12/03/2018 (  Approximate)   SpO2 99%   BMI 37.30 kg/m  Wt Readings from Last 3 Encounters:  12/24/18 238 lb 2 oz (108 kg)  12/17/18 238 lb (108 kg)  09/12/18 227 lb (103 kg)     Lab Results  Component Value Date   WBC 6.6 06/02/2017   HGB 14.4 06/02/2017   HCT 42.4 06/02/2017   PLT 257.0 06/02/2017   GLUCOSE 96 06/02/2017   ALT 13 06/02/2017   AST 14 06/02/2017   NA 141 06/02/2017   K 4.0 06/02/2017   CL 106 06/02/2017   CREATININE 0.71 06/02/2017   BUN 13 06/02/2017   CO2 28 06/02/2017   TSH 2.62 06/02/2017   HGBA1C 5.2 06/02/2017    Dg Lumbar Spine Complete  Result Date: 08/29/2016 CLINICAL DATA:  Low back pain and left hip  pain for the past 2 months with no known injury EXAM: LUMBAR SPINE - COMPLETE 4+ VIEW COMPARISON:  None in PACs FINDINGS: The lumbar vertebral bodies are preserved in height. The pedicles and transverse processes are intact. The disc space heights are well maintained. There is no spondylolisthesis. The observed portions of the sacrum are normal. IMPRESSION: There is no acute or significant chronic bony abnormality of the lumbar spine. Electronically Signed   By: David  Martinique M.D.   On: 08/29/2016 15:56   Dg Hip Unilat W Or W/o Pelvis 2-3 Views Left  Result Date: 08/29/2016 CLINICAL DATA:  Low back and left hip pain for 2 months with no known injury. EXAM: DG HIP (WITH OR WITHOUT PELVIS) 2-3V LEFT COMPARISON:  None in PACs FINDINGS: The bony pelvis is subjectively adequately mineralized. There is no lytic or blastic lesion. AP and lateral views of the left hip reveal preservation of the joint space. The articular surfaces of the femoral head and acetabulum remain smoothly rounded. The femoral neck, intertrochanteric, and subtrochanteric regions are normal. IMPRESSION: There is no acute or significant chronic bony abnormality of the left hip. Electronically Signed   By: David  Martinique M.D.   On: 08/29/2016 15:55     Assessment & Plan:  Plan  I am having Katherine [name] start on predniSONE and metaxalone. I am also having her maintain her sertraline, Prenatal MV & Min w/FA-DHA (PRENATAL ADULT GUMMY/DHA/FA PO), cyclobenzaprine, and meloxicam.  Meds ordered this encounter  Medications  . predniSONE (DELTASONE) 10 MG tablet    Sig: TAKE 3 TABLETS PO QD FOR 3 DAYS THEN TAKE 2 TABLETS PO QD FOR 3 DAYS THEN TAKE 1 TABLET PO QD FOR 3 DAYS THEN TAKE 1/2 TAB PO QD FOR 3 DAYS    Dispense:  20 tablet    Refill:  0  . metaxalone (SKELAXIN) 800 MG tablet    Sig: Take 1 tablet (800 mg total) by mouth 3 (three) times daily.    Dispense:  30 tablet    Refill:  0    Problem List Items Addressed This Visit     None    Visit Diagnoses    Acute left-sided low back pain with right-sided sciatica    -  Primary   Relevant Medications   predniSONE (DELTASONE) 10 MG tablet   metaxalone (SKELAXIN) 800 MG tablet   Other Relevant Orders   DG Lumbar Spine Complete   POCT urine pregnancy (Completed)    con't heat/ ice  meds per orders  Check xrays  Consider PT  Sport med pending  Follow-up: Return in about 2 weeks (around 01/07/2019), or if symptoms worsen or  fail to improve, for f/u back pain with pcp.  Donato SchultzYvonne R Lowne Chase, DO

## 2018-12-24 NOTE — Patient Instructions (Signed)
Acute Back Pain, Adult  Acute back pain is sudden and usually short-lived. It is often caused by an injury to the muscles and tissues in the back. The injury may result from:   A muscle or ligament getting overstretched or torn (strained). Ligaments are tissues that connect bones to each other. Lifting something improperly can cause a back strain.   Wear and tear (degeneration) of the spinal disks. Spinal disks are circular tissue that provides cushioning between the bones of the spine (vertebrae).   Twisting motions, such as while playing sports or doing yard work.   A hit to the back.   Arthritis.  You may have a physical exam, lab tests, and imaging tests to find the cause of your pain. Acute back pain usually goes away with rest and home care.  Follow these instructions at home:  Managing pain, stiffness, and swelling   Take over-the-counter and prescription medicines only as told by your health care provider.   Your health care provider may recommend applying ice during the first 24-48 hours after your pain starts. To do this:  ? Put ice in a plastic bag.  ? Place a towel between your skin and the bag.  ? Leave the ice on for 20 minutes, 2-3 times a day.   If directed, apply heat to the affected area as often as told by your health care provider. Use the heat source that your health care provider recommends, such as a moist heat pack or a heating pad.  ? Place a towel between your skin and the heat source.  ? Leave the heat on for 20-30 minutes.  ? Remove the heat if your skin turns bright red. This is especially important if you are unable to feel pain, heat, or cold. You have a greater risk of getting burned.  Activity     Do not stay in bed. Staying in bed for more than 1-2 days can delay your recovery.   Sit up and stand up straight. Avoid leaning forward when you sit, or hunching over when you stand.  ? If you work at a desk, sit close to it so you do not need to lean over. Keep your chin tucked  in. Keep your neck drawn back, and keep your elbows bent at a right angle. Your arms should look like the letter "L."  ? Sit high and close to the steering wheel when you drive. Add lower back (lumbar) support to your car seat, if needed.   Take short walks on even surfaces as soon as you are able. Try to increase the length of time you walk each day.   Do not sit, drive, or stand in one place for more than 30 minutes at a time. Sitting or standing for long periods of time can put stress on your back.   Do not drive or use heavy machinery while taking prescription pain medicine.   Use proper lifting techniques. When you bend and lift, use positions that put less stress on your back:  ? Bend your knees.  ? Keep the load close to your body.  ? Avoid twisting.   Exercise regularly as told by your health care provider. Exercising helps your back heal faster and helps prevent back injuries by keeping muscles strong and flexible.   Work with a physical therapist to make a safe exercise program, as recommended by your health care provider. Do any exercises as told by your physical therapist.  Lifestyle   Maintain   a healthy weight. Extra weight puts stress on your back and makes it difficult to have good posture.   Avoid activities or situations that make you feel anxious or stressed. Stress and anxiety increase muscle tension and can make back pain worse. Learn ways to manage anxiety and stress, such as through exercise.  General instructions   Sleep on a firm mattress in a comfortable position. Try lying on your side with your knees slightly bent. If you lie on your back, put a pillow under your knees.   Follow your treatment plan as told by your health care provider. This may include:  ? Cognitive or behavioral therapy.  ? Acupuncture or massage therapy.  ? Meditation or yoga.  Contact a health care provider if:   You have pain that is not relieved with rest or medicine.   You have increasing pain going down  into your legs or buttocks.   Your pain does not improve after 2 weeks.   You have pain at night.   You lose weight without trying.   You have a fever or chills.  Get help right away if:   You develop new bowel or bladder control problems.   You have unusual weakness or numbness in your arms or legs.   You develop nausea or vomiting.   You develop abdominal pain.   You feel faint.  Summary   Acute back pain is sudden and usually short-lived.   Use proper lifting techniques. When you bend and lift, use positions that put less stress on your back.   Take over-the-counter and prescription medicines and apply heat or ice as directed by your health care provider.  This information is not intended to replace advice given to you by your health care provider. Make sure you discuss any questions you have with your health care provider.  Document Released: 06/20/2005 Document Revised: 01/25/2018 Document Reviewed: 02/01/2017  Elsevier Interactive Patient Education  2019 [employer]

## 2019-04-02 DIAGNOSIS — E669 Obesity, unspecified: Secondary | ICD-10-CM | POA: Diagnosis not present

## 2019-04-02 DIAGNOSIS — Z6826 Body mass index (BMI) 26.0-26.9, adult: Secondary | ICD-10-CM | POA: Diagnosis not present

## 2019-04-02 DIAGNOSIS — R634 Abnormal weight loss: Secondary | ICD-10-CM | POA: Diagnosis not present

## 2019-04-02 DIAGNOSIS — N979 Female infertility, unspecified: Secondary | ICD-10-CM | POA: Diagnosis not present

## 2019-04-02 DIAGNOSIS — L682 Localized hypertrichosis: Secondary | ICD-10-CM | POA: Diagnosis not present

## 2019-04-02 DIAGNOSIS — N939 Abnormal uterine and vaginal bleeding, unspecified: Secondary | ICD-10-CM | POA: Diagnosis not present

## 2019-04-22 ENCOUNTER — Other Ambulatory Visit: Payer: Self-pay

## 2019-04-22 DIAGNOSIS — Z20822 Contact with and (suspected) exposure to covid-19: Secondary | ICD-10-CM

## 2019-04-24 LAB — NOVEL CORONAVIRUS, NAA: SARS-CoV-2, NAA: NOT DETECTED

## 2019-05-08 ENCOUNTER — Other Ambulatory Visit: Payer: Self-pay | Admitting: Family Medicine

## 2019-05-08 DIAGNOSIS — F419 Anxiety disorder, unspecified: Secondary | ICD-10-CM

## 2019-12-05 ENCOUNTER — Encounter: Payer: Self-pay | Admitting: Family Medicine

## 2019-12-05 ENCOUNTER — Other Ambulatory Visit: Payer: Self-pay

## 2019-12-05 ENCOUNTER — Telehealth (INDEPENDENT_AMBULATORY_CARE_PROVIDER_SITE_OTHER): Payer: Self-pay | Admitting: Family Medicine

## 2019-12-05 DIAGNOSIS — J019 Acute sinusitis, unspecified: Secondary | ICD-10-CM

## 2019-12-05 MED ORDER — IPRATROPIUM BROMIDE 0.03 % NA SOLN: 2.0000 | Freq: Two times a day (BID) | NASAL | 12 refills | Status: DC

## 2019-12-05 NOTE — Progress Notes (Signed)
Virtual Visit via Video Note  I connected with Katherine [name] on 12/05/19 at  2:20 PM EDT by a video enabled telemedicine application and verified that I am speaking with the correct person using two identifiers.  Location: Patient: home alone  Provider: offie   I discussed the limitations of evaluation and management by telemedicine and the availability of in person appointments. The patient expressed understanding and agreed to proceed.  History of Present Illness: Pt is home c/o fatigue and congestion x3 days ,  + green mucus,  + sinus pressure and headache,  +sore throat  She had a rapid test for covid and it was negative  Pt took Ibuprofen and nyquil  --- she has been on fertility meds and may be pregnant --- she takes preg test Monday  Observations/Objective: There were no vitals filed for this visit. There is no height or weight on file to calculate BMI. Pt is in nad Assessment and Plan: 1. Acute non-recurrent sinusitis, unspecified location con't zyrtec  - ipratropium (ATROVENT) 0.03 % nasal spray; Place 2 sprays into both nostrils every 12 (twelve) hours.  Dispense: 30 mL; Refill: 12 Pt will call in 7-10 days if symptoms do not improve    Follow Up Instructions:    I discussed the assessment and treatment plan with the patient. The patient was provided an opportunity to ask questions and all were answered. The patient agreed with the plan and demonstrated an understanding of the instructions.   The patient was advised to call back or seek an in-person evaluation if the symptoms worsen or if the condition fails to improve as anticipated.  I provided 25 minutes of non-face-to-face time during this encounter.   Donato Schultz, DO

## 2019-12-05 NOTE — Patient Instructions (Signed)
Sinusitis, Adult Sinusitis is inflammation of your sinuses. Sinuses are hollow spaces in the bones around your face. Your sinuses are located:  Around your eyes.  In the middle of your forehead.  Behind your nose.  In your cheekbones. Mucus normally drains out of your sinuses. When your nasal tissues become inflamed or swollen, mucus can become trapped or blocked. This allows bacteria, viruses, and fungi to grow, which leads to infection. Most infections of the sinuses are caused by a virus. Sinusitis can develop quickly. It can last for up to 4 weeks (acute) or for more than 12 weeks (chronic). Sinusitis often develops after a cold. What are the causes? This condition is caused by anything that creates swelling in the sinuses or stops mucus from draining. This includes:  Allergies.  Asthma.  Infection from bacteria or viruses.  Deformities or blockages in your nose or sinuses.  Abnormal growths in the nose (nasal polyps).  Pollutants, such as chemicals or irritants in the air.  Infection from fungi (rare). What increases the risk? You are more likely to develop this condition if you:  Have a weak body defense system (immune system).  Do a lot of swimming or diving.  Overuse nasal sprays.  Smoke. What are the signs or symptoms? The main symptoms of this condition are pain and a feeling of pressure around the affected sinuses. Other symptoms include:  Stuffy nose or congestion.  Thick drainage from your nose.  Swelling and warmth over the affected sinuses.  Headache.  Upper toothache.  A cough that may get worse at night.  Extra mucus that collects in the throat or the back of the nose (postnasal drip).  Decreased sense of smell and taste.  Fatigue.  A fever.  Sore throat.  Bad breath. How is this diagnosed? This condition is diagnosed based on:  Your symptoms.  Your medical history.  A physical exam.  Tests to find out if your condition is  acute or chronic. This may include: ? Checking your nose for nasal polyps. ? Viewing your sinuses using a device that has a light (endoscope). ? Testing for allergies or bacteria. ? Imaging tests, such as an MRI or CT scan. In rare cases, a bone biopsy may be done to rule out more serious types of fungal sinus disease. How is this treated? Treatment for sinusitis depends on the cause and whether your condition is chronic or acute.  If caused by a virus, your symptoms should go away on their own within 10 days. You may be given medicines to relieve symptoms. They include: ? Medicines that shrink swollen nasal passages (topical intranasal decongestants). ? Medicines that treat allergies (antihistamines). ? A spray that eases inflammation of the nostrils (topical intranasal corticosteroids). ? Rinses that help get rid of thick mucus in your nose (nasal saline washes).  If caused by bacteria, your health care provider may recommend waiting to see if your symptoms improve. Most bacterial infections will get better without antibiotic medicine. You may be given antibiotics if you have: ? A severe infection. ? A weak immune system.  If caused by narrow nasal passages or nasal polyps, you may need to have surgery. Follow these instructions at home: Medicines  Take, use, or apply over-the-counter and prescription medicines only as told by your health care provider. These may include nasal sprays.  If you were prescribed an antibiotic medicine, take it as told by your health care provider. Do not stop taking the antibiotic even if you start   to feel better. Hydrate and humidify   Drink enough fluid to keep your urine pale yellow. Staying hydrated will help to thin your mucus.  Use a cool mist humidifier to keep the humidity level in your home above 50%.  Inhale steam for 10-15 minutes, 3-4 times a day, or as told by your health care provider. You can do this in the bathroom while a hot shower is  running.  Limit your exposure to cool or dry air. Rest  Rest as much as possible.  Sleep with your head raised (elevated).  Make sure you get enough sleep each night. General instructions   Apply a warm, moist washcloth to your face 3-4 times a day or as told by your health care provider. This will help with discomfort.  Wash your hands often with soap and water to reduce your exposure to germs. If soap and water are not available, use hand sanitizer.  Do not smoke. Avoid being around people who are smoking (secondhand smoke).  Keep all follow-up visits as told by your health care provider. This is important. Contact a health care provider if:  You have a fever.  Your symptoms get worse.  Your symptoms do not improve within 10 days. Get help right away if:  You have a severe headache.  You have persistent vomiting.  You have severe pain or swelling around your face or eyes.  You have vision problems.  You develop confusion.  Your neck is stiff.  You have trouble breathing. Summary  Sinusitis is soreness and inflammation of your sinuses. Sinuses are hollow spaces in the bones around your face.  This condition is caused by nasal tissues that become inflamed or swollen. The swelling traps or blocks the flow of mucus. This allows bacteria, viruses, and fungi to grow, which leads to infection.  If you were prescribed an antibiotic medicine, take it as told by your health care provider. Do not stop taking the antibiotic even if you start to feel better.  Keep all follow-up visits as told by your health care provider. This is important. This information is not intended to replace advice given to you by your health care provider. Make sure you discuss any questions you have with your health care provider. Document Revised: 11/20/2017 Document Reviewed: 11/20/2017 Elsevier Patient Education  2020 Elsevier Inc.  

## 2019-12-08 ENCOUNTER — Emergency Department (HOSPITAL_BASED_OUTPATIENT_CLINIC_OR_DEPARTMENT_OTHER)
Admission: EM | Admit: 2019-12-08 | Discharge: 2019-12-08 | Disposition: A | Discharge status: Home / Self Care | Payer: 59 | Attending: Emergency Medicine | Admitting: Emergency Medicine

## 2019-12-08 ENCOUNTER — Encounter (HOSPITAL_BASED_OUTPATIENT_CLINIC_OR_DEPARTMENT_OTHER): Payer: Self-pay | Admitting: *Deleted

## 2019-12-08 ENCOUNTER — Other Ambulatory Visit: Payer: Self-pay

## 2019-12-08 DIAGNOSIS — J45909 Unspecified asthma, uncomplicated: Secondary | ICD-10-CM | POA: Diagnosis not present

## 2019-12-08 DIAGNOSIS — J069 Acute upper respiratory infection, unspecified: Secondary | ICD-10-CM | POA: Diagnosis not present

## 2019-12-08 DIAGNOSIS — Z20822 Contact with and (suspected) exposure to covid-19: Secondary | ICD-10-CM | POA: Insufficient documentation

## 2019-12-08 DIAGNOSIS — R197 Diarrhea, unspecified: Secondary | ICD-10-CM | POA: Diagnosis present

## 2019-12-08 DIAGNOSIS — Z79899 Other long term (current) drug therapy: Secondary | ICD-10-CM | POA: Diagnosis not present

## 2019-12-08 LAB — CBC WITH DIFFERENTIAL/PLATELET
Abs Immature Granulocytes: 0.02 10*3/uL (ref 0.00–0.07)
Basophils Absolute: 0 10*3/uL (ref 0.0–0.1)
Basophils Relative: 0 %
Eosinophils Absolute: 0.3 10*3/uL (ref 0.0–0.5)
Eosinophils Relative: 3 %
HCT: 42.3 % (ref 36.0–46.0)
Hemoglobin: 14.1 g/dL (ref 12.0–15.0)
Immature Granulocytes: 0 %
Lymphocytes Relative: 16 %
Lymphs Abs: 1.2 10*3/uL (ref 0.7–4.0)
MCH: 29.6 pg (ref 26.0–34.0)
MCHC: 33.3 g/dL (ref 30.0–36.0)
MCV: 88.9 fL (ref 80.0–100.0)
Monocytes Absolute: 1 10*3/uL (ref 0.1–1.0)
Monocytes Relative: 14 %
Neutro Abs: 4.9 10*3/uL (ref 1.7–7.7)
Neutrophils Relative %: 67 %
Platelets: 250 10*3/uL (ref 150–400)
RBC: 4.76 MIL/uL (ref 3.87–5.11)
RDW: 12.5 % (ref 11.5–15.5)
WBC: 7.3 10*3/uL (ref 4.0–10.5)
nRBC: 0 % (ref 0.0–0.2)

## 2019-12-08 LAB — COMPREHENSIVE METABOLIC PANEL
ALT: 43 U/L (ref 0–44)
AST: 36 U/L (ref 15–41)
Albumin: 4.2 g/dL (ref 3.5–5.0)
Alkaline Phosphatase: 52 U/L (ref 38–126)
Anion gap: 10 (ref 5–15)
BUN: 13 mg/dL (ref 6–20)
CO2: 24 mmol/L (ref 22–32)
Calcium: 9.1 mg/dL (ref 8.9–10.3)
Chloride: 106 mmol/L (ref 98–111)
Creatinine, Ser: 0.8 mg/dL (ref 0.44–1.00)
GFR calc Af Amer: >60 mL/min (ref >60)
GFR calc non Af Amer: >60 mL/min (ref >60)
Glucose, Bld: 92 mg/dL (ref 70–99)
Potassium: 4.5 mmol/L (ref 3.5–5.1)
Sodium: 140 mmol/L (ref 135–145)
Total Bilirubin: 0.6 mg/dL (ref 0.3–1.2)
Total Protein: 7.5 g/dL (ref 6.5–8.1)

## 2019-12-08 LAB — URINALYSIS, ROUTINE W REFLEX MICROSCOPIC
Glucose, UA: NEGATIVE mg/dL
Hgb urine dipstick: NEGATIVE
Ketones, ur: 15 mg/dL — AB
Leukocytes,Ua: NEGATIVE
Nitrite: NEGATIVE
Protein, ur: 30 mg/dL — AB
Specific Gravity, Urine: >1.03 — ABNORMAL HIGH (ref 1.005–1.030)
pH: 6 (ref 5.0–8.0)

## 2019-12-08 LAB — URINALYSIS, MICROSCOPIC (REFLEX): RBC / HPF: NONE SEEN RBC/hpf (ref 0–5)

## 2019-12-08 LAB — PREGNANCY, URINE: Preg Test, Ur: NEGATIVE

## 2019-12-08 LAB — SARS CORONAVIRUS 2 BY RT PCR (HOSPITAL ORDER, PERFORMED IN ~~LOC~~ HOSPITAL LAB): SARS Coronavirus 2: NEGATIVE

## 2019-12-08 MED ORDER — SODIUM CHLORIDE 0.9 % IV BOLUS
1000.0000 mL | Freq: Once | INTRAVENOUS | Status: AC
  Administered 2019-12-08: 1000 mL via INTRAVENOUS

## 2019-12-08 NOTE — ED Triage Notes (Signed)
Last Monday began having runny nose, throat itching, had a rapid covid test which was negative, her primary MD told her it was sinusitis. Last Friday began having loose stool, after taking meds prescribed by her EDP, pt states she also may be pregnant since her and husband have been trying.

## 2019-12-08 NOTE — ED Provider Notes (Signed)
[facility] EMERGENCY DEPARTMENT Provider Note   CSN: [id number] Arrival date & time: 12/08/19  1506     History Chief Complaint  Patient presents with  . Diarrhea    Katherine [name] is a 29 y.o. female.  Patient is a 29 year old female who presents with diarrhea.  She said she has had a 5 to 6-day history of runny nose congestion and sinus pressure.  She had a telehealth visit with her MD who started her on a sinus nasal spray as well as antihistamine.  She says her sinus congestion is started to get better but she has had lots of watery diarrhea over the last 2 days.  This is the third day.  She feels like she is dehydrated.  She denies any nausea or vomiting.  She has abdominal cramping just prior to the diarrhea but no persistent abdominal pain.  No shortness of breath.  No known fevers.  No known sick contacts.  She had a rapid Covid test that was negative.        Past Medical History:  Diagnosis Date  . Asthma   . Elevated blood pressure reading   . Frequent headaches   . Recurrent UTI     Patient Active Problem List   Diagnosis Date Noted  . Overweight 01/14/2016    History reviewed. No pertinent surgical history.   OB History   No obstetric history on file.     Family History  Problem Relation Age of Onset  . Hypertension Father   . Hypertension Brother     Social History   Tobacco Use  . Smoking status: Never Smoker  . Smokeless tobacco: Never Used  Substance Use Topics  . Alcohol use: Yes    Alcohol/week: 2.0 - 3.0 standard drinks    Types: 2 - 3 Standard drinks or equivalent per week    Comment: occassionally  . Drug use: No    Home Medications Prior to Admission medications   Medication Sig Start Date End Date Taking? Authorizing Provider  cyclobenzaprine (FLEXERIL) 10 MG tablet Take 0.5-1 tablets (5-10 mg total) by mouth 3 (three) times daily as needed for muscle spasms. 12/17/18   Sharlene Dory, DO  ipratropium  (ATROVENT) 0.03 % nasal spray Place 2 sprays into both nostrils every 12 (twelve) hours. 12/05/19   Donato Schultz, DO  meloxicam (MOBIC) 15 MG tablet Take 1 tablet (15 mg total) by mouth daily. 12/17/18   Sharlene Dory, DO  metaxalone (SKELAXIN) 800 MG tablet Take 1 tablet (800 mg total) by mouth 3 (three) times daily. 12/24/18   Donato Schultz, DO  Prenatal MV & Min w/FA-DHA (PRENATAL ADULT GUMMY/DHA/FA PO) Take by mouth.    [provider]  sertraline (ZOLOFT) 100 MG tablet TAKE 1 TABLET(100 MG) BY MOUTH DAILY 05/08/19   Copland, Gwenlyn Found, MD    Allergies    Prednisone  Review of Systems   Review of Systems  Constitutional: Positive for fatigue. Negative for chills, diaphoresis and fever.  HENT: Positive for congestion, postnasal drip and rhinorrhea. Negative for sneezing.   Eyes: Negative.   Respiratory: Negative for cough, chest tightness and shortness of breath.   Cardiovascular: Negative for chest pain and leg swelling.  Gastrointestinal: Positive for diarrhea. Negative for abdominal pain, blood in stool, nausea and vomiting.  Genitourinary: Negative for difficulty urinating, flank pain, frequency and hematuria.  Musculoskeletal: Negative for arthralgias and back pain.  Skin: Negative for rash.  Neurological: Negative  for dizziness, speech difficulty, weakness, numbness and headaches.    Physical Exam Updated Vital Signs BP (!) 145/103 (BP Location: Right Arm)   Pulse 84   Temp 98.2 F (36.8 C) (Oral)   Resp 15   Ht 5\' 7"  (1.702 m)   Wt 107 kg   SpO2 98%   BMI 36.96 kg/m   Physical Exam Constitutional:      Appearance: She is well-developed.  HENT:     Head: Normocephalic and atraumatic.  Eyes:     Pupils: Pupils are equal, round, and reactive to light.  Cardiovascular:     Rate and Rhythm: Normal rate and regular rhythm.     Heart sounds: Normal heart sounds.  Pulmonary:     Effort: Pulmonary effort is normal. No respiratory  distress.     Breath sounds: Normal breath sounds. No wheezing or rales.  Chest:     Chest wall: No tenderness.  Abdominal:     General: Bowel sounds are normal.     Palpations: Abdomen is soft.     Tenderness: There is no abdominal tenderness. There is no guarding or rebound.  Musculoskeletal:        General: Normal range of motion.     Cervical back: Normal range of motion and neck supple.  Lymphadenopathy:     Cervical: No cervical adenopathy.  Skin:    General: Skin is warm and dry.     Findings: No rash.  Neurological:     Mental Status: She is alert and oriented to person, place, and time.     ED Results / Procedures / Treatments   Labs (all labs ordered are listed, but only abnormal results are displayed) Labs Reviewed  URINALYSIS, ROUTINE W REFLEX MICROSCOPIC - Abnormal; Notable for the following components:      Result Value   APPearance CLOUDY (*)    Specific Gravity, Urine >1.030 (*)    Bilirubin Urine SMALL (*)    Ketones, ur 15 (*)    Protein, ur 30 (*)    All other components within normal limits  URINALYSIS, MICROSCOPIC (REFLEX) - Abnormal; Notable for the following components:   Bacteria, UA MANY (*)    All other components within normal limits  SARS CORONAVIRUS 2 BY RT PCR (HOSPITAL ORDER, [place] [facility])  GASTROINTESTINAL PANEL BY PCR, STOOL (REPLACES STOOL CULTURE)  C DIFFICILE QUICK SCREEN W PCR REFLEX  PREGNANCY, URINE  COMPREHENSIVE METABOLIC PANEL  CBC WITH DIFFERENTIAL/PLATELET    EKG None  Radiology No results found.  Procedures Procedures (including critical care time)  Medications Ordered in ED Medications  sodium chloride 0.9 % bolus 1,000 mL (1,000 mLs Intravenous New Bag/Given 12/08/19 1611)    ED Course  I have reviewed the triage vital signs and the nursing notes.  Pertinent labs & imaging results that were available during my care of the patient were reviewed by me and considered in my medical  decision making (see chart for details).    MDM Rules/Calculators/A&P                      Patient is a 29 year old female who presents with diarrhea.  She had a recent URI.  She has not been on antibiotics.  She has no associate abdominal pain.  Her abdomen is nontender.  Her labs are nonconcerning.  Her vital signs are stable.  She is going to attempt to get a stool specimen here but as of yet she  has not been able to.  If she can we will send it out for testing.  This is likely a viral syndrome and most likely is self-limited.  She has no associated nausea vomiting or significant dehydration.  She was given IV fluids in the ED.  She has not had any episodes of diarrhea in the ED.  She was discharged home in good condition.  She was advised to follow-up with her PCP if her symptoms are not improving or return here as needed for any worsening symptoms.  Final Clinical Impression(s) / ED Diagnoses Final diagnoses:  Viral upper respiratory tract infection  Diarrhea, unspecified type    Rx / DC Orders ED Discharge Orders    None       Rolan Bucco, MD 12/08/19 1721

## 2019-12-09 ENCOUNTER — Telehealth: Payer: Self-pay | Admitting: Family Medicine

## 2019-12-09 MED ORDER — DIPHENOXYLATE-ATROPINE 2.5-0.025 MG PO TABS: 1.0000 | ORAL_TABLET | Freq: Four times a day (QID) | ORAL | 0 refills | Status: DC | PRN

## 2019-12-09 NOTE — Telephone Encounter (Signed)
Called her back She notes diarrhea only- no vomiting- since this past Friday.  Today is Monday She went to the ER yesterday and got fluids- she felt better Labs look ok No blood or mucus in her stool She is having several stools per day- 5-6 No fever   She has tried imodium- not helpful She is not pregnant - she is trying to conceive but not pregnant yet Will have her use lomotil prn She will let me know if not improving soon- Sooner if worse.

## 2019-12-09 NOTE — Telephone Encounter (Signed)
Caller : Cathlyn  Call Back # [phone]  Patient states seen via mychart last week on 12/05/2019 for sinus infection, prescribed medication. Per patient after taking medication, she has experienced diarrhea since Friday constantly . Patient seen in ED over the weekend . Patient would like stool tested by PCP. Patient is still having diarrhea.    Please Advise

## 2020-02-07 DIAGNOSIS — N911 Secondary amenorrhea: Secondary | ICD-10-CM | POA: Diagnosis not present

## 2020-02-07 DIAGNOSIS — O021 Missed abortion: Secondary | ICD-10-CM | POA: Diagnosis not present

## 2020-02-07 NOTE — H&P (Signed)
Katherine [name] is an 29 y.o. female G1 diagnosed with 6 week missed abortion.  After counseling patient for expectant vs medication vs surgical management, she elects to proceed with D&C.  Pertinent Gynecological History: Menses: n/a Bleeding: n/a Contraception: none DES exposure: unknown Blood transfusions: none Sexually transmitted diseases: no past history Previous GYN Procedures: none  Last mammogram: n/a Date: n/a Last pap: normal Date: 10/2018 OB History: G1, P0   Menstrual History: Menarche age: n/a No LMP recorded.    Past Medical History:  Diagnosis Date  . Asthma   . Elevated blood pressure reading   . Frequent headaches   . Recurrent UTI     No past surgical history on file.  Family History  Problem Relation Age of Onset  . Hypertension Father   . Hypertension Brother     Social History:  reports that she has never smoked. She has never used smokeless tobacco. She reports current alcohol use of about 2.0 - 3.0 standard drinks of alcohol per week. She reports that she does not use drugs.  Allergies:  Allergies  Allergen Reactions  . Prednisone Other (See Comments)    Pt states "It makes me feel weird.    No medications prior to admission.    Review of Systems  There were no vitals taken for this visit. Physical Exam Constitutional:      Appearance: Normal appearance.  HENT:     Head: Normocephalic and atraumatic.  Pulmonary:     Effort: Pulmonary effort is normal.  Abdominal:     Palpations: Abdomen is soft.  Musculoskeletal:     Cervical back: Normal range of motion.  Skin:    General: Skin is warm and dry.  Neurological:     Mental Status: She is alert.  Psychiatric:        Mood and Affect: Mood normal.        Behavior: Behavior normal.     No results found for this or any previous visit (from the past 24 hour(s)).  No results found.  Assessment/Plan: 28yo G1 with missed ab at 6 weeks -Blood type pending; drawn in office  8/6 -Patient is counseled re: risk of bleeding, infection, scarring and damage to surrounding structures.  All questions were answered and patient wishes to proceed.  Mitchel Honour 02/07/2020, 1:29 PM

## 2020-02-10 ENCOUNTER — Other Ambulatory Visit: Payer: Self-pay

## 2020-02-10 ENCOUNTER — Other Ambulatory Visit (HOSPITAL_COMMUNITY)
Admission: RE | Admit: 2020-02-10 | Discharge: 2020-02-10 | Disposition: A | Discharge status: Home / Self Care | Payer: 59 | Source: Ambulatory Visit | Attending: Obstetrics & Gynecology | Admitting: Obstetrics & Gynecology

## 2020-02-10 ENCOUNTER — Encounter (HOSPITAL_COMMUNITY): Payer: Self-pay | Admitting: Obstetrics & Gynecology

## 2020-02-10 DIAGNOSIS — Z01812 Encounter for preprocedural laboratory examination: Secondary | ICD-10-CM | POA: Insufficient documentation

## 2020-02-10 DIAGNOSIS — Z20822 Contact with and (suspected) exposure to covid-19: Secondary | ICD-10-CM | POA: Insufficient documentation

## 2020-02-10 LAB — SARS CORONAVIRUS 2 (TAT 6-24 HRS): SARS Coronavirus 2: NEGATIVE

## 2020-02-10 NOTE — Anesthesia Preprocedure Evaluation (Addendum)
Anesthesia Evaluation  Patient identified by MRN, date of birth, ID band Patient awake    Reviewed: Allergy & Precautions, NPO status , Patient's Chart, lab work & pertinent test results  History of Anesthesia Complications Negative for: history of anesthetic complications  Airway Mallampati: I  TM Distance: >3 FB Neck ROM: Full    Dental  (+) Teeth Intact   Pulmonary asthma ,    Pulmonary exam normal        Cardiovascular negative cardio ROS Normal cardiovascular exam     Neuro/Psych  Headaches, Anxiety Depression    GI/Hepatic negative GI ROS, Neg liver ROS,   Endo/Other  negative endocrine ROS  Renal/GU negative Renal ROS  negative genitourinary   Musculoskeletal negative musculoskeletal ROS (+)   Abdominal   Peds  Hematology negative hematology ROS (+)   Anesthesia Other Findings   Reproductive/Obstetrics (+) Pregnancy (6 week missed ab)                            Anesthesia Physical Anesthesia Plan  ASA: II  Anesthesia Plan: MAC   Post-op Pain Management:    Induction: Intravenous  PONV Risk Score and Plan: 2 and Propofol infusion, TIVA and Treatment may vary due to age or medical condition  Airway Management Planned: Natural Airway, Nasal Cannula and Simple Face Mask  Additional Equipment: None  Intra-op Plan:   Post-operative Plan:   Informed Consent: I have reviewed the patients History and Physical, chart, labs and discussed the procedure including the risks, benefits and alternatives for the proposed anesthesia with the patient or authorized representative who has indicated his/her understanding and acceptance.     Dental advisory given  Plan Discussed with:   Anesthesia Plan Comments:        Anesthesia Quick Evaluation

## 2020-02-10 NOTE — Progress Notes (Signed)
Same- Day Work-Up Phone Call:  PCP - Abbe Amsterdam, MD Cardiologist - Denies  PPM/ICD - Denies  Chest x-ray - N/A EKG - N/A Stress Test - Denies ECHO - Denies Cardiac Cath - Denies  Sleep Study - Denies  Patient denies being diabetic.  Blood Thinner Instructions: N/A Aspirin Instructions: N/A  ERAS Protcol -Yes PRE-SURGERY Ensure or G2- Not ordered  COVID TEST- 02/10/20   Anesthesia review: No  ----------------------------------------------  Patient Pre-Op Instructions:   Your procedure is scheduled on Tuesday August 10.  Report to [facility] Main Entrance "A" at 05:30 A.M., and check in at the Admitting office.  Call this number if you have problems the morning of surgery:  [phone]   Remember:  Do not eat after midnight the night before your surgery.  You may drink clear liquids until 04:30 AM the morning of your surgery.   Clear liquids allowed are: Water, Non-Citrus Juices (without pulp), Carbonated Beverages, Clear Tea, Black Coffee Only, and Gatorade.    Take these medicines the morning of surgery with A SIP OF WATER: cyclobenzaprine (FLEXERIL)- if needed  As of today, STOP taking any Aspirin (unless otherwise instructed by your surgeon), meloxicam (MOBIC),  Aleve, Naproxen, Ibuprofen, Motrin, Advil, Goody's, BC's, all herbal medications, fish oil, and all vitamins.  Do NOT Smoke (Tobacco/Vaping) or drink Alcohol 24 hours prior to your procedure.   Day of Surgery:            Remember to brush your teeth WITH YOUR REGULAR TOOTHPASTE.            Shower.            Do not wear jewelry, make up, or nail polish.            Do not wear lotions, powders, perfumes, or deodorant.            Do not bring valuables to the hospital.            Wear Clean/Comfortable clothing the morning of surgery            Do not bring valuables to the hospital.

## 2020-02-11 ENCOUNTER — Other Ambulatory Visit: Payer: Self-pay

## 2020-02-11 ENCOUNTER — Encounter (HOSPITAL_COMMUNITY)
Admission: RE | Disposition: A | Discharge status: Home / Self Care | Payer: Self-pay | Source: Home / Self Care | Attending: Obstetrics & Gynecology

## 2020-02-11 ENCOUNTER — Ambulatory Visit (HOSPITAL_COMMUNITY): Payer: BC Managed Care – PPO | Admitting: Anesthesiology

## 2020-02-11 ENCOUNTER — Encounter (HOSPITAL_COMMUNITY): Payer: Self-pay | Admitting: Obstetrics & Gynecology

## 2020-02-11 ENCOUNTER — Ambulatory Visit (HOSPITAL_COMMUNITY)
Admission: RE | Admit: 2020-02-11 | Discharge: 2020-02-11 | Disposition: A | Discharge status: Home / Self Care | Payer: BC Managed Care – PPO | Attending: Obstetrics & Gynecology | Admitting: Obstetrics & Gynecology

## 2020-02-11 DIAGNOSIS — Z3A01 Less than 8 weeks gestation of pregnancy: Secondary | ICD-10-CM | POA: Diagnosis not present

## 2020-02-11 DIAGNOSIS — F418 Other specified anxiety disorders: Secondary | ICD-10-CM | POA: Diagnosis not present

## 2020-02-11 DIAGNOSIS — O411010 Infection of amniotic sac and membranes, unspecified, first trimester, not applicable or unspecified: Secondary | ICD-10-CM | POA: Insufficient documentation

## 2020-02-11 DIAGNOSIS — J45909 Unspecified asthma, uncomplicated: Secondary | ICD-10-CM | POA: Diagnosis not present

## 2020-02-11 DIAGNOSIS — O021 Missed abortion: Secondary | ICD-10-CM | POA: Diagnosis not present

## 2020-02-11 DIAGNOSIS — Z888 Allergy status to other drugs, medicaments and biological substances status: Secondary | ICD-10-CM | POA: Diagnosis not present

## 2020-02-11 DIAGNOSIS — O99511 Diseases of the respiratory system complicating pregnancy, first trimester: Secondary | ICD-10-CM | POA: Diagnosis not present

## 2020-02-11 HISTORY — DX: Anxiety disorder, unspecified: F41.9

## 2020-02-11 HISTORY — PX: DILATION AND EVACUATION: SHX1459

## 2020-02-11 HISTORY — DX: Depression, unspecified: F32.A

## 2020-02-11 LAB — CBC
HCT: 40.5 % (ref 36.0–46.0)
Hemoglobin: 13 g/dL (ref 12.0–15.0)
MCH: 29.1 pg (ref 26.0–34.0)
MCHC: 32.1 g/dL (ref 30.0–36.0)
MCV: 90.6 fL (ref 80.0–100.0)
Platelets: 251 10*3/uL (ref 150–400)
RBC: 4.47 MIL/uL (ref 3.87–5.11)
RDW: 12.9 % (ref 11.5–15.5)
WBC: 8.2 10*3/uL (ref 4.0–10.5)
nRBC: 0 % (ref 0.0–0.2)

## 2020-02-11 LAB — TYPE AND SCREEN
ABO/RH(D): O POS
Antibody Screen: NEGATIVE

## 2020-02-11 SURGERY — DILATION AND EVACUATION, UTERUS
Anesthesia: Monitor Anesthesia Care | Site: Vagina

## 2020-02-11 MED ORDER — LIDOCAINE 2% (20 MG/ML) 5 ML SYRINGE
INTRAMUSCULAR | Status: DC | PRN
  Administered 2020-02-11: 60 mg via INTRAVENOUS

## 2020-02-11 MED ORDER — ONDANSETRON HCL 4 MG/2ML IJ SOLN: 4.0000 mg | Freq: Once | INTRAMUSCULAR | Status: DC | PRN

## 2020-02-11 MED ORDER — KETOROLAC TROMETHAMINE 30 MG/ML IJ SOLN: 30.0000 mg | Freq: Once | INTRAMUSCULAR | Status: DC | PRN

## 2020-02-11 MED ORDER — PROPOFOL 10 MG/ML IV BOLUS
INTRAVENOUS | Status: DC | PRN
  Administered 2020-02-11 (×4): 50 mg via INTRAVENOUS

## 2020-02-11 MED ORDER — LACTATED RINGERS IV SOLN: INTRAVENOUS | Status: DC

## 2020-02-11 MED ORDER — MIDAZOLAM HCL 5 MG/5ML IJ SOLN
INTRAMUSCULAR | Status: DC | PRN
  Administered 2020-02-11: 2 mg via INTRAVENOUS

## 2020-02-11 MED ORDER — DOXYCYCLINE HYCLATE 100 MG PO TABS: 200.0000 mg | ORAL_TABLET | Freq: Once | ORAL | Status: DC

## 2020-02-11 MED ORDER — MIDAZOLAM HCL 2 MG/2ML IJ SOLN
INTRAMUSCULAR | Status: AC
  Filled 2020-02-11: qty 2

## 2020-02-11 MED ORDER — FENTANYL CITRATE (PF) 100 MCG/2ML IJ SOLN
INTRAMUSCULAR | Status: AC
  Filled 2020-02-11: qty 2

## 2020-02-11 MED ORDER — FENTANYL CITRATE (PF) 100 MCG/2ML IJ SOLN
25.0000 ug | INTRAMUSCULAR | Status: DC | PRN
  Administered 2020-02-11 (×2): 50 ug via INTRAVENOUS

## 2020-02-11 MED ORDER — LIDOCAINE 2% (20 MG/ML) 5 ML SYRINGE
INTRAMUSCULAR | Status: AC
  Filled 2020-02-11: qty 5

## 2020-02-11 MED ORDER — DEXAMETHASONE SODIUM PHOSPHATE 10 MG/ML IJ SOLN
INTRAMUSCULAR | Status: AC
  Filled 2020-02-11: qty 1

## 2020-02-11 MED ORDER — PROPOFOL 10 MG/ML IV BOLUS
INTRAVENOUS | Status: AC
  Filled 2020-02-11: qty 20

## 2020-02-11 MED ORDER — IBUPROFEN 800 MG PO TABS: 800.0000 mg | ORAL_TABLET | Freq: Four times a day (QID) | ORAL | Status: DC

## 2020-02-11 MED ORDER — OXYCODONE HCL 5 MG PO TABS: 5.0000 mg | ORAL_TABLET | Freq: Once | ORAL | Status: DC | PRN

## 2020-02-11 MED ORDER — CHLORHEXIDINE GLUCONATE 0.12 % MT SOLN
OROMUCOSAL | Status: AC
  Administered 2020-02-11: 15 mL
  Filled 2020-02-11: qty 15

## 2020-02-11 MED ORDER — FENTANYL CITRATE (PF) 250 MCG/5ML IJ SOLN
INTRAMUSCULAR | Status: AC
  Filled 2020-02-11: qty 5

## 2020-02-11 MED ORDER — AMISULPRIDE (ANTIEMETIC) 5 MG/2ML IV SOLN: 10.0000 mg | Freq: Once | INTRAVENOUS | Status: DC | PRN

## 2020-02-11 MED ORDER — 0.9 % SODIUM CHLORIDE (POUR BTL) OPTIME
TOPICAL | Status: DC | PRN
  Administered 2020-02-11: 1000 mL

## 2020-02-11 MED ORDER — OXYCODONE-ACETAMINOPHEN 5-325 MG PO TABS: 1.0000 | ORAL_TABLET | ORAL | 0 refills | Status: DC | PRN

## 2020-02-11 MED ORDER — POVIDONE-IODINE 10 % EX SWAB: 2.0000 "application " | Freq: Once | CUTANEOUS | Status: DC

## 2020-02-11 MED ORDER — PROPOFOL 1000 MG/100ML IV EMUL
INTRAVENOUS | Status: AC
  Filled 2020-02-11: qty 100

## 2020-02-11 MED ORDER — ONDANSETRON HCL 4 MG/2ML IJ SOLN
INTRAMUSCULAR | Status: AC
  Filled 2020-02-11: qty 2

## 2020-02-11 MED ORDER — PROPOFOL 500 MG/50ML IV EMUL
INTRAVENOUS | Status: DC | PRN
  Administered 2020-02-11: 150 ug/kg/min via INTRAVENOUS

## 2020-02-11 MED ORDER — SODIUM CHLORIDE 0.9 % IV SOLN
100.0000 mg | Freq: Once | INTRAVENOUS | Status: AC
  Administered 2020-02-11: 100 mg via INTRAVENOUS
  Filled 2020-02-11 (×2): qty 100

## 2020-02-11 MED ORDER — DEXAMETHASONE SODIUM PHOSPHATE 10 MG/ML IJ SOLN
INTRAMUSCULAR | Status: DC | PRN
  Administered 2020-02-11: 4 mg via INTRAVENOUS

## 2020-02-11 MED ORDER — OXYCODONE HCL 5 MG/5ML PO SOLN: 5.0000 mg | Freq: Once | ORAL | Status: DC | PRN

## 2020-02-11 MED ORDER — CHLOROPROCAINE HCL 1 % IJ SOLN
INTRAMUSCULAR | Status: AC
  Filled 2020-02-11: qty 30

## 2020-02-11 MED ORDER — ONDANSETRON HCL 4 MG/2ML IJ SOLN
INTRAMUSCULAR | Status: DC | PRN
  Administered 2020-02-11: 4 mg via INTRAVENOUS

## 2020-02-11 MED ORDER — FENTANYL CITRATE (PF) 250 MCG/5ML IJ SOLN
INTRAMUSCULAR | Status: DC | PRN
  Administered 2020-02-11 (×3): 50 ug via INTRAVENOUS

## 2020-02-11 SURGICAL SUPPLY — 23 items
CATH ROBINSON RED A/P 16FR (CATHETERS) ×2 IMPLANT
DECANTER SPIKE VIAL GLASS SM (MISCELLANEOUS) ×2 IMPLANT
FILTER UTR ASPR ASSEMBLY (MISCELLANEOUS) ×2 IMPLANT
GAUZE SPONGE 4X4 16PLY XRAY LF (GAUZE/BANDAGES/DRESSINGS) ×2 IMPLANT
GLOVE BIO SURGEON STRL SZ 6 (GLOVE) ×2 IMPLANT
GLOVE BIOGEL PI IND STRL 6 (GLOVE) ×1 IMPLANT
GLOVE BIOGEL PI IND STRL 7.0 (GLOVE) ×1 IMPLANT
GLOVE BIOGEL PI INDICATOR 6 (GLOVE) ×1
GLOVE BIOGEL PI INDICATOR 7.0 (GLOVE) ×1
GOWN STRL REUS W/ TWL LRG LVL3 (GOWN DISPOSABLE) ×2 IMPLANT
GOWN STRL REUS W/TWL LRG LVL3 (GOWN DISPOSABLE) ×2
HOSE CONNECTING 18IN BERKELEY (TUBING) ×2 IMPLANT
KIT BERKELEY 1ST TRI 3/8 NO TR (MISCELLANEOUS) ×2 IMPLANT
KIT BERKELEY 1ST TRIMESTER 3/8 (MISCELLANEOUS) ×2 IMPLANT
NS IRRIG 1000ML POUR BTL (IV SOLUTION) ×2 IMPLANT
PACK VAGINAL MINOR WOMEN LF (CUSTOM PROCEDURE TRAY) ×2 IMPLANT
PAD OB MATERNITY 4.3X12.25 (PERSONAL CARE ITEMS) ×2 IMPLANT
SET BERKELEY SUCTION TUBING (SUCTIONS) ×2 IMPLANT
TOWEL GREEN STERILE FF (TOWEL DISPOSABLE) ×4 IMPLANT
VACURETTE 10 RIGID CVD (CANNULA) IMPLANT
VACURETTE 7MM CVD STRL WRAP (CANNULA) IMPLANT
VACURETTE 8 RIGID CVD (CANNULA) IMPLANT
VACURETTE 9 RIGID CVD (CANNULA) IMPLANT

## 2020-02-11 NOTE — Progress Notes (Addendum)
No change to H&P.  Patient would like genetic studies.   Blood type O pos.  Mitchel Honour, DO

## 2020-02-11 NOTE — Op Note (Signed)
PREOPERATIVE DIAGNOSIS: 28yo withmissed abortion measuring 6weeks   POSTOPERATIVE DIAGNOSIS: The same  PROCEDURE: Suction Dilation and Evacuation  SURGEON: Dr. Linda Hedges  INDICATIONS:28y.o. here for scheduled surgery for treatment ofmissed abortion.Risks of surgery were discussed with the patient including but not limited to: bleeding which may require transfusion; infection which may require antibiotics; injury to uterus or surrounding organs; intrauterine scarring which may impair future fertility; need for additional procedures including laparotomy or laparoscopy; and other postoperative/anesthesia complications. Written informed consent was obtained.   FINDINGS: An 8week sized uterus   ANESTHESIA:MAC  ESTIMATED BLOOD LOSS:50cc  SPECIMENS: POC sent to pathology and genetics studies sent separately  COMPLICATIONS: None immediate.  PROCEDURE DETAILS: The patient was taken to the operating room where [facility] administered.After an adequate timeout was performed, she was placed in the dorsal lithotomy position and examined; then prepped and draped in the sterile manner. Her bladder was catheterized for an unmeasured amount of clear, yellow urine. A speculum was then placed in the patient's vagina and a tenaculumwas applied to the anterior lip of the cervix.The uterus was dilated manually with metal dilators to accommodate the65m suction curette. Once the cervix was dilated, the suction curette was advanced without difficulty. Suction curettage was performed x 3passes.Sharp curettage was performed and a gritty texture was appreciated globally. A final suction curettage was performed. Thetenaculumwas removed from the anterior lip of the cervix.Hemostasis was observed.The vaginal speculum was removed after noting good hemostasis. The patient tolerated the procedure well and was taken to the recovery area awake and in stable condition.

## 2020-02-11 NOTE — Anesthesia Procedure Notes (Signed)
Procedure Name: MAC Performed by: Michele Rockers, CRNA Pre-anesthesia Checklist: Patient identified, Emergency Drugs available, Suction available, Timeout performed and Patient being monitored Patient Re-evaluated:Patient Re-evaluated prior to induction Oxygen Delivery Method: Simple face mask

## 2020-02-11 NOTE — Discharge Instructions (Signed)
Call MD for T>100.4, heavy vaginal bleeding, severe abdominal pain, or respiratory distress.  Call office to schedule postop visit in 2 weeks.  Pelvic rest x 2 weeks.  No driving while taking narcotics.

## 2020-02-11 NOTE — Transfer of Care (Signed)
Immediate Anesthesia Transfer of Care Note  Patient: Katherine [name]  Procedure(s) Performed: DILATATION AND EVACUATION (N/A Vagina )  Patient Location: PACU  Anesthesia Type:MAC  Level of Consciousness: drowsy, patient cooperative and responds to stimulation  Airway & Oxygen Therapy: Patient Spontanous Breathing and Patient connected to nasal cannula oxygen  Post-op Assessment: Report given to RN and Post -op Vital signs reviewed and stable  Post vital signs: Reviewed and stable  Last Vitals:  Vitals Value Taken Time  BP 126/98 02/11/20 0816  Temp    Pulse 60 02/11/20 0816  Resp 21 02/11/20 0816  SpO2 99 % 02/11/20 0816  Vitals shown include unvalidated device data.  Last Pain:  Vitals:   02/11/20 0700  TempSrc: Temporal  PainSc:       Patients Stated Pain Goal: 2 (94/76/54 6503)  Complications: No complications documented.

## 2020-02-11 NOTE — Anesthesia Postprocedure Evaluation (Signed)
Anesthesia Post Note  Patient: Katherine [name]  Procedure(s) Performed: DILATATION AND EVACUATION (N/A Vagina )     Patient location during evaluation: PACU Anesthesia Type: MAC Level of consciousness: awake and alert Pain management: pain level controlled Vital Signs Assessment: post-procedure vital signs reviewed and stable Respiratory status: spontaneous breathing, nonlabored ventilation and respiratory function stable Cardiovascular status: blood pressure returned to baseline and stable Postop Assessment: no apparent nausea or vomiting Anesthetic complications: no   No complications documented.  Last Vitals:  Vitals:   02/11/20 0930 02/11/20 0934  BP: 113/79   Pulse: 66 62  Resp: 13 14  Temp:  36.8 C  SpO2: 97% 97%    Last Pain:  Vitals:   02/11/20 0915  TempSrc:   PainSc: [place]

## 2020-02-11 NOTE — Progress Notes (Signed)
Mitchel Honour MD Nurse at [facility] was notified of the patient's percocet prescription being denied. The nurse plans to reach out to [name] MD and the patient was notified of the situation.

## 2020-02-12 ENCOUNTER — Encounter (HOSPITAL_COMMUNITY): Payer: Self-pay | Admitting: Obstetrics & Gynecology

## 2020-02-12 ENCOUNTER — Encounter: Payer: Self-pay | Admitting: Family Medicine

## 2020-02-12 LAB — SURGICAL PATHOLOGY

## 2020-02-13 ENCOUNTER — Telehealth: Payer: Self-pay | Admitting: Family Medicine

## 2020-02-13 DIAGNOSIS — F419 Anxiety disorder, unspecified: Secondary | ICD-10-CM

## 2020-02-13 MED ORDER — SERTRALINE HCL 100 MG PO TABS: 100.0000 mg | ORAL_TABLET | Freq: Every day | ORAL | 0 refills | Status: DC

## 2020-02-13 NOTE — Telephone Encounter (Signed)
Medication refilled

## 2020-02-13 NOTE — Telephone Encounter (Signed)
Medication: sertraline (ZOLOFT) 100 MG tablet [[id number]]       Has the patient contacted their pharmacy?  (If no, request that the patient contact the pharmacy for the refill.) (If yes, when and what did the pharmacy advise?)     Preferred Pharmacy (with phone number or street name): [id number] - [place], [place] - [address] AT [facility] [address]  [address], [place] [address]  Phone:  [phone] Fax:  [phone]      Agent: Please be advised that RX refills may take up to 3 business days. We ask that you follow-up with your pharmacy.

## 2020-02-24 ENCOUNTER — Ambulatory Visit: Payer: 59 | Admitting: Internal Medicine

## 2020-03-12 ENCOUNTER — Ambulatory Visit: Payer: 59 | Admitting: Medical

## 2020-03-12 DIAGNOSIS — M25571 Pain in right ankle and joints of right foot: Secondary | ICD-10-CM | POA: Diagnosis not present

## 2020-03-12 DIAGNOSIS — M79671 Pain in right foot: Secondary | ICD-10-CM | POA: Diagnosis not present

## 2020-03-15 NOTE — Progress Notes (Deleted)
[facility] [address] [place], [place] [address] [phone]  Date:  03/18/2020   Name:  Katherine [name]   DOB:  Sep 15, 1990   MRN:  [id number]  PCP:  Pearline Cables, MD    Chief Complaint: No chief complaint on file.   History of Present Illness:  Katherine [name] is a 29 y.o. very pleasant female patient who presents with the following:  Pt here today for a CPE Last seen by myself 08/2018 She does have some history of anxiety and depression  Married to [name] She had a MC last month-treated with a D&C per Dr. Langston Masker  Pap tdap covid series Hep C and HIV screening   Patient Active Problem List   Diagnosis Date Noted  . Overweight 01/14/2016    Past Medical History:  Diagnosis Date  . Anxiety   . Asthma   . Depression   . Elevated blood pressure reading   . Frequent headaches   . Recurrent UTI     Past Surgical History:  Procedure Laterality Date  . DILATION AND EVACUATION N/A 02/11/2020   Procedure: DILATATION AND EVACUATION;  Surgeon: Mitchel Honour, DO;  Location: [facility] OR;  Service: Gynecology;  Laterality: N/A;    Social History   Tobacco Use  . Smoking status: Never Smoker  . Smokeless tobacco: Never Used  Vaping Use  . Vaping Use: Never used  Substance Use Topics  . Alcohol use: Yes    Alcohol/week: 2.0 - 3.0 standard drinks    Types: 2 - 3 Standard drinks or equivalent per week    Comment: occassionally  . Drug use: No    Family History  Problem Relation Age of Onset  . Hypertension Father   . Hypertension Brother     Allergies  Allergen Reactions  . Prednisone Other (See Comments)    Pt states "It makes me feel weird.    Medication list has been reviewed and updated.  Current Outpatient Medications on File Prior to Visit  Medication Sig Dispense Refill  . oxyCODONE-acetaminophen (PERCOCET) 5-325 MG tablet Take 1 tablet by mouth every 4 (four) hours as needed for  severe pain. 15 tablet 0  . Prenatal MV & Min w/FA-DHA (PRENATAL ADULT GUMMY/DHA/FA PO) Take 2 tablets by mouth at bedtime. Gummies    . sertraline (ZOLOFT) 100 MG tablet Take 1 tablet (100 mg total) by mouth at bedtime. 90 tablet 0   No current facility-administered medications on file prior to visit.    Review of Systems:  As per HPI- otherwise negative.   Physical Examination: There were no vitals filed for this visit. There were no vitals filed for this visit. There is no height or weight on file to calculate BMI. Ideal Body Weight:    GEN: no acute distress. HEENT: Atraumatic, Normocephalic.  Ears and Nose: No external deformity. CV: RRR, No M/G/R. No JVD. No thrill. No extra heart sounds. PULM: CTA B, no wheezes, crackles, rhonchi. No retractions. No resp. distress. No accessory muscle use. ABD: S, NT, ND, +BS. No rebound. No HSM. EXTR: No c/c/e PSYCH: Normally interactive. Conversant.    Assessment and Plan: *** This visit occurred during the SARS-CoV-2 public health emergency.  Safety protocols were in place, including screening questions prior to the visit, additional usage of staff PPE, and extensive cleaning of exam room while observing appropriate contact time as indicated for disinfecting solutions.    Signed Shanda Bumps Thompson Mckim,  MD

## 2020-03-17 ENCOUNTER — Encounter: Payer: Self-pay | Admitting: Family Medicine

## 2020-03-18 ENCOUNTER — Encounter: Payer: Self-pay | Admitting: Family Medicine

## 2020-03-18 ENCOUNTER — Ambulatory Visit (INDEPENDENT_AMBULATORY_CARE_PROVIDER_SITE_OTHER): Payer: BC Managed Care – PPO | Admitting: Family Medicine

## 2020-03-18 DIAGNOSIS — Z5329 Procedure and treatment not carried out because of patient's decision for other reasons: Secondary | ICD-10-CM | POA: Diagnosis not present

## 2020-03-25 ENCOUNTER — Other Ambulatory Visit: Payer: BC Managed Care – PPO

## 2020-03-25 DIAGNOSIS — Z20822 Contact with and (suspected) exposure to covid-19: Secondary | ICD-10-CM

## 2020-03-27 LAB — SARS-COV-2, NAA 2 DAY TAT

## 2020-03-27 LAB — NOVEL CORONAVIRUS, NAA: SARS-CoV-2, NAA: NOT DETECTED

## 2020-03-30 ENCOUNTER — Telehealth: Payer: Self-pay

## 2020-03-30 NOTE — Telephone Encounter (Signed)
Nurse Assessment Nurse: Katherine Maze, RN, Clydie Braun Date/Time Lamount Cohen Time): 03/28/2020 12:43:43 PM Confirm and document reason for call. If symptomatic, describe symptoms. ---Caller states that she was tested negative for covid on Tuesday and today has no taste or smell- was at concert last Saturday - had a head ache Does the patient have any new or worsening symptoms? ---Yes Will a triage be completed? ---Yes Related visit to physician within the last 2 weeks? ---Yes Does the PT have any chronic conditions? (i.e. diabetes, asthma, this includes High risk factors for pregnancy, etc.) ---Yes List chronic conditions. ---Anxiety Is the patient pregnant or possibly pregnant? (Ask all females between the ages of 38-55) ---No Is this a behavioral health or substance abuse call? ---No Guidelines Guideline Title Affirmed Question Affirmed Notes Nurse Date/Time Lamount Cohen Time) Sinus Pain or Congestion [1] Redness or swelling on the cheek, forehead or around the eye AND [2] no fever Ellwood Dense 03/28/2020 12:47:33 PM Disp. Time Lamount Cohen Time) Disposition Final User 03/28/2020 12:50:07 PM See HCP within 4 Hours (or PCP triage) Yes Katherine Maze, RN, Octaviano Glow NOTE: All timestamps contained within this report are represented as [place] Standard Time. CONFIDENTIALTY NOTICE: This fax transmission is intended only for the addressee. It contains information that is legally privileged, confidential or otherwise protected from use or disclosure. If you are not the intended recipient, you are strictly prohibited from reviewing, disclosing, copying using or disseminating any of this information or taking any action in reliance on or regarding this information. If you have received this fax in error, please notify us immediately by telephone so that we can arrange for its return to [place]. Phone: [phone], Toll-Free: [phone], Fax: [phone] Page: 2 of 2 Call Id: [id number] Caller Disagree/Comply  Comply Caller Understands Yes PreDisposition Call Doctor Care Advice Given Per Guideline SEE HCP (OR PCP TRIAGE) WITHIN 4 HOURS: * IF OFFICE WILL BE CLOSED AND NO PCP (PRIMARY CARE PROVIDER) SECOND-LEVEL TRIAGE: You need to be seen within the next 3 or 4 hours. A nearby Urgent Care Center [facility]) is often a good source of care. Another choice is to go to the ED. Go sooner if you become worse. NOTE TO TRIAGER: * Use nurse judgment to select the most appropriate source of care. * Consider both the urgency of the patient's symptoms AND what resources may be needed to evaluate and manage the patient. SOURCES OF CARE: * UCC: Some UCCs can manage patients who are stable and have less serious symptoms (e.g., minor illnesses and injuries). The triager must know the [facility] capabilities before sending a patient there. If unsure, call ahead. * OFFICE: If patient sounds stable and not seriously ill, consult PCP (or follow your office policy) to see if patient can be seen NOW in office. PAIN MEDICINES: * ACETAMINOPHEN - REGULAR STRENGTH TYLENOL: Take 650 mg (two 325 mg pills) by mouth every 4 to 6 hours as needed. Each Regular Strength Tylenol pill has 325 mg of acetaminophen. The most you should take each day is 3,250 mg (10 pills a day). * IBUPROFEN (E.G., MOTRIN, ADVIL): Take 400 mg (two 200 mg pills) by mouth every 6 hours. The most you should take each day is 1,200 mg (six 200 mg pills), unless your doctor has told you to take more. CALL BACK IF: * You become worse CARE ADVICE given per Sinus Pain or Congestion (Adult) guideline. Referrals GO TO FACILITY OTHER - SPECIFY

## 2020-03-30 NOTE — Telephone Encounter (Signed)
Patient seeing provider tomorrow.

## 2020-03-31 ENCOUNTER — Telehealth (INDEPENDENT_AMBULATORY_CARE_PROVIDER_SITE_OTHER): Payer: BC Managed Care – PPO | Admitting: Family Medicine

## 2020-03-31 ENCOUNTER — Other Ambulatory Visit: Payer: Self-pay

## 2020-03-31 ENCOUNTER — Encounter: Payer: Self-pay | Admitting: Family Medicine

## 2020-03-31 VITALS — BP 150/109 | HR 87 | Temp 98.4°F | Ht 67.0 in | Wt 240.0 lb

## 2020-03-31 DIAGNOSIS — J0140 Acute pansinusitis, unspecified: Secondary | ICD-10-CM | POA: Diagnosis not present

## 2020-03-31 MED ORDER — FLUTICASONE PROPIONATE 50 MCG/ACT NA SUSP: 2.0000 | Freq: Every day | NASAL | 6 refills | Status: DC

## 2020-03-31 MED ORDER — AMOXICILLIN-POT CLAVULANATE 875-125 MG PO TABS: 1.0000 | ORAL_TABLET | Freq: Two times a day (BID) | ORAL | 0 refills | Status: DC

## 2020-03-31 NOTE — Progress Notes (Signed)
Virtual Visit via Video Note  I connected with Katherine [name] on 03/31/20 at  4:00 PM EDT by a video enabled telemedicine application and verified that I am speaking with the correct person using two identifiers.  Location/persons in visit  Patient: home alone--  With infant nephew  Provider: office    I discussed the limitations of evaluation and management by telemedicine and the availability of in person appointments. The patient expressed understanding and agreed to proceed.  History of Present Illness: Pt is home c/o sinus congestion and headache x 1 week.   She tested neg for covid  No fever  She tested for covid last week and it was neg    Observations/Objective: Vitals:   03/31/20 1554  BP: (!) 150/109  Pulse: 87  Temp: 98.4 F (36.9 C)  pt is in nad  No sob    Assessment and Plan: 1. Acute non-recurrent pansinusitis abx and flonase  F/u in 1-2 weeks for bp check  - fluticasone (FLONASE) 50 MCG/ACT nasal spray; Place 2 sprays into both nostrils daily.  Dispense: 16 g; Refill: 6 - amoxicillin-clavulanate (AUGMENTIN) 875-125 MG tablet; Take 1 tablet by mouth 2 (two) times daily.  Dispense: 20 tablet; Refill: 0   Follow Up Instructions:    I discussed the assessment and treatment plan with the patient. The patient was provided an opportunity to ask questions and all were answered. The patient agreed with the plan and demonstrated an understanding of the instructions.   The patient was advised to call back or seek an in-person evaluation if the symptoms worsen or if the condition fails to improve as anticipated.  I provided 25 minutes of non-face-to-face time during this encounter.   Donato Schultz, DO

## 2020-04-06 ENCOUNTER — Other Ambulatory Visit: Payer: Self-pay

## 2020-04-06 ENCOUNTER — Telehealth (INDEPENDENT_AMBULATORY_CARE_PROVIDER_SITE_OTHER): Payer: BC Managed Care – PPO | Admitting: Medical

## 2020-04-06 VITALS — BP 120/70 | HR 84

## 2020-04-06 DIAGNOSIS — J329 Chronic sinusitis, unspecified: Secondary | ICD-10-CM

## 2020-04-06 MED ORDER — AZITHROMYCIN 250 MG PO TABS
ORAL_TABLET | ORAL | 0 refills | Status: DC
Start: 2020-04-06 — End: 2020-04-27

## 2020-04-06 MED ORDER — METHYLPREDNISOLONE 4 MG PO TABS
ORAL_TABLET | ORAL | 0 refills | Status: DC
Start: 2020-04-06 — End: 2020-04-27

## 2020-04-06 MED ORDER — BENZONATATE 100 MG PO CAPS: 100.0000 mg | ORAL_CAPSULE | Freq: Three times a day (TID) | ORAL | 0 refills | Status: DC | PRN

## 2020-04-06 NOTE — Progress Notes (Signed)
   Subjective:    Patient ID: Katherine [name], female    DOB: 10-13-1990, 29 y.o.   MRN: [id number]  HPI  Virtual Visit via Video Note  I connected with Katherine [name] on 04/06/20 at  3:40 PM EDT by a video enabled telemedicine application and verified that I am speaking with the correct person using two identifiers.  Location: Patient: car.  Provider: office Participants- myself, pt and Dahlia Client MA.   I discussed the limitations of evaluation and management by telemedicine and the availability of in person appointments. The patient expressed understanding and agreed to proceed.  History of Present Illness: Day 14 of signs and symptoms. 2 weeks ago Monday nephew had viral illness. Pt got nasal congestion with sinus pressure, green mucus and face pressure.  Nephew and various family members all tested negative for covid. Pt also tested negative for covid.  Pt still feeling sick. Pt never got fever. No wheezing or sob. She does have productive cough. Also states still has sinus pressure.  Failed treatment to flonase and augmentin last week.       Observations/Objective: in person exam  General  Mental Status - Alert. General Appearance - Well groomed. Not in acute distress.    HEENT Head- Normal. Ear Auditory Canal - Left- Normal. Right - Normal.Tympanic Membrane- Left- Normal. Right- Normal. Eye Sclera/Conjunctiva- Left- Normal. Right- Normal. Nose & Sinuses Nasal Mucosa- Left-  Boggy and Congested. Right-  Boggy and  Congested.Bilateral maxillary and frontal sinus pressure.   Neck Neck- Supple. No Masses.   Chest and Lung Exam Auscultation: Breath Sounds:-Clear even and unlabored.  Cardiovascular Auscultation:Rythm- Regular, rate and rhythm. Murmurs & Other Heart Sounds:Ausculatation of the heart reveal- No Murmurs.  Lymphatic Head & Neck General Head & Neck Lymphatics: Bilateral: Description- No Localized lymphadenopathy.     Assessment and  Plan: For sinusitis related to allergies will xray azithromycin and medrol dose pack. I think this would be way to treat in light of fact that you have failed treatment to augmentin and flonase.   Covid test sent out today.  If signs and symptoms change or worsen pending covid results let us know. Stay home/not to work pending test results.  Follow up 7 days or as needed  Esperanza Richters, PA-C  Follow Up Instructions:    I discussed the assessment and treatment plan with the patient. The patient was provided an opportunity to ask questions and all were answered. The patient agreed with the plan and demonstrated an understanding of the instructions.   The patient was advised to call back or seek an in-person evaluation if the symptoms worsen or if the condition fails to improve as anticipated.  I provided 30  minutes of non-face-to-face time during this encounter.   Esperanza Richters, PA-C   Review of Systems     Objective:   Physical Exam        Assessment & Plan:

## 2020-04-06 NOTE — Patient Instructions (Addendum)
For sinusitis related to allergies will xray azithromycin and medrol dose pack. I think this would be way to treat in light of fact that you have failed treatment to augmentin and flonase.   Covid test sent out today.  If signs and symptoms change or worsen pending covid results let us know. Stay home/not to work pending test results.  Follow up 7 days or as needed

## 2020-04-08 ENCOUNTER — Telehealth: Payer: Self-pay | Admitting: Family Medicine

## 2020-04-08 NOTE — Telephone Encounter (Signed)
Patient states seen by Esperanza Richters, pt is requesting lab results.

## 2020-04-08 NOTE — Telephone Encounter (Signed)
Labs pending , will contact when labs are back

## 2020-04-09 ENCOUNTER — Encounter: Payer: Self-pay | Admitting: Medical

## 2020-04-09 LAB — SARS-COV-2, NAA 2 DAY TAT

## 2020-04-09 LAB — NOVEL CORONAVIRUS, NAA: SARS-CoV-2, NAA: DETECTED — AB

## 2020-04-09 NOTE — Telephone Encounter (Signed)
Pt states she had symptoms 9/20 & then she tested negative on 9/21  .. and then tested positive on  04/06/20 .. states she feel better , no fever , but has congestion.. patient wants to know if she able to fly to [place] today , I told her No since she was positive on 10/4 . Wants a call back from provider .

## 2020-04-09 NOTE — Telephone Encounter (Signed)
See comments on my chart result note. If she has questions a lot of questions/concerns maybe better to do virtual visit tomorrow.   But ask pt what is her question. After she reads result note

## 2020-04-09 NOTE — Telephone Encounter (Signed)
I sent pt my chart response.

## 2020-04-09 NOTE — Telephone Encounter (Signed)
Patient is requesting a call back inference to covid results, patient states she is positive and has questions.

## 2020-04-09 NOTE — Telephone Encounter (Signed)
The "detected" comment that you were waiting on came back  in before you could write feedback for patient .

## 2020-04-10 ENCOUNTER — Telehealth: Payer: Self-pay | Admitting: Family

## 2020-04-10 NOTE — Telephone Encounter (Signed)
Error

## 2020-04-14 DIAGNOSIS — Z20822 Contact with and (suspected) exposure to covid-19: Secondary | ICD-10-CM | POA: Diagnosis not present

## 2020-04-15 ENCOUNTER — Telehealth: Payer: BC Managed Care – PPO | Admitting: Medical

## 2020-04-25 NOTE — Progress Notes (Addendum)
[facility] [address], [address] [place], [place] [address] [phone]  Date:  04/27/2020   Name:  Katherine [name]   DOB:  11/04/1990   MRN:  [id number]  PCP:  Pearline Cables, MD    Chief Complaint: Annual Exam (no pap-sees gyn)   History of Present Illness:  Katherine [name] is a 29 y.o. very pleasant female patient who presents with the following:  Young woman, generally healthy except for anxiety/depression and overweight-here today for physical Last seen by myself in person in February 2020  She is married, no children yet though they are trying to conceive Tested positive for COVID-19 on October 4- she is feeling pretty much back to normal, not quite 100% yet. She is getting some headaches recently, some fatigue still from:  Hepatitis C screening-presume hep C and HIV screening were done by GYN during fertility work-up HIV screen Tetanus-we will discuss things, she would like to delay as she hopes to be pregnant soon and will need a Tdap during gestation COVID-19 series- she would like to do once her window passes from recent illness.  She did NOT have the antibody infusion  Flu shot- she declines today  Pap smear- she is a pt at [facility], her pap is UTD   CMP, CBC on chart from August  She feels like sertraline is working pretty well for her overall as far as depression However, she notes some sx of ADHD- she feels like she struggles to get things done. 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 Overweight 01/14/2016    Past Medical History:  Diagnosis Date  . Anxiety   . Asthma   . Depression   . Elevated blood pressure reading   . Frequent headaches   . Recurrent UTI     Past Surgical History:  Procedure Laterality Date  . DILATION AND EVACUATION N/A 02/11/2020   Procedure: DILATATION AND EVACUATION;  Surgeon: Mitchel Honour, DO;  Location: [facility] OR;  Service: Gynecology;  Laterality: N/A;    Social History   Tobacco Use  . Smoking status: Never Smoker  . Smokeless tobacco: Never Used  Vaping Use  . Vaping Use: Never used  Substance Use Topics  . Alcohol use: Yes    Alcohol/week: 2.0 - 3.0 standard drinks    Types: 2 - 3 Standard drinks or equivalent per week    Comment: occassionally  . Drug use: No    Family History  Problem Relation Age of Onset  . Hypertension Father   . Hypertension Brother     Allergies  Allergen Reactions  . Prednisone Other (See Comments)    Pt states "It makes me feel weird.    Medication list has been  reviewed and updated.  Current Outpatient Medications on File Prior to Visit  Medication Sig Dispense Refill  . Prenatal MV & Min w/FA-DHA (PRENATAL ADULT GUMMY/DHA/FA PO) Take 2 tablets by mouth at bedtime. Gummies    . sertraline (ZOLOFT) 100 MG tablet Take 1 tablet (100 mg total) by mouth at bedtime. 90 tablet 0   No current facility-administered medications on file prior to visit.    Review of Systems:  As per HPI- otherwise negative.   Physical Examination: Vitals:   04/27/20 1106  BP: 124/88  Pulse: 94  Resp: 17  SpO2: 98%   Vitals:   04/27/20 1106  Weight: 242 lb (109.8 kg)  Height: 5\' 7"  (1.702 m)   Body mass index is 37.9 kg/m. Ideal Body Weight: Weight in (lb) to have BMI = 25: 159.3  GEN: no acute distress.  Obese, otherwise looks  well HEENT: Atraumatic, Normocephalic.   Bilateral TM wnl, oropharynx normal.  PEERL,EOMI.   Ears and Nose: No external deformity. CV: RRR, No M/G/R. No JVD. No thrill. No extra heart sounds. PULM: CTA B, no wheezes, crackles, rhonchi. No retractions. No resp. distress. No accessory muscle use. ABD: S, NT, ND, +BS. No rebound. No HSM. EXTR: No c/c/e PSYCH: Normally interactive. Conversant.    Assessment and Plan: Physical exam  Screening for hyperlipidemia - Plan: Lipid panel  Screening for thyroid disorder - Plan: TSH  Screening for diabetes mellitus - Plan: Comprehensive metabolic panel, Hemoglobin A1c   Patient here today for physical exam.  Discussed health maintenance, healthy diet and exercise routine She declines flu shot today Pap is up-to-date per GYN We discussed concerns about ADHD.  I suggested that she seek evaluation with a psychologist.  If she does test positive for ADHD, I am glad to start her on treatment for convenience.  However, she may want to delay this right now as she is active trying to become pregnant We discussed trying different medication besides sertraline for depression.  For the time being she is happy with sertraline and wishes to continue it I advised her that she can have her COVID-19 series at any time since she did not have the antibody infusion.  She plans to wait about 30 days after her diagnosis Will plan further follow- up pending labs.  This visit occurred during the SARS-CoV-2 public health emergency.  Safety protocols were in place, including screening questions prior to the visit, additional usage of staff PPE, and extensive cleaning of exam room while observing appropriate contact time as indicated for disinfecting solutions.    Signed , MD  /26, received her labs as below.  Message to patient Results for orders placed or performed in visit on 04/27/20  Comprehensive metabolic panel  Result Value Ref Range   Glucose,  Bld 85 65 - 99 mg/dL   BUN 10 7 - 25 mg/dL   Creat 04/29/20 4.58 - 0.99 mg/dL   BUN/Creatinine Ratio NOT APPLICABLE 6 - 22 (calc)   Sodium 137 135 - 146 mmol/L   Potassium 4.7 3.5 - 5.3 mmol/L   Chloride 104 98 - 110 mmol/L   CO2 27 20 - 32 mmol/L   Calcium 9.7 8.6 - 10.2 mg/dL   Total Protein 6.6 6.1 - 8.1 g/dL   Albumin 4.0 3.6 - 5.1 g/dL   Globulin 2.6 1.9 - 3.7 g/dL (calc)   AG Ratio 1.5 1.0 - 2.5 (calc)   Total Bilirubin 0.4 0.2 - 1.2 mg/dL   Alkaline phosphatase (APISO) 58  31 - 125 U/L   AST 13 10 - 30 U/L   ALT 15 6 - 29 U/L  Hemoglobin A1c  Result Value Ref Range   Hgb A1c MFr Bld 5.1 <5.7 % of total Hgb   Mean Plasma Glucose 100 (calc)   eAG (mmol/L) 5.5 (calc)  Lipid panel  Result Value Ref Range   Cholesterol 167 <200 mg/dL   HDL 50 > OR = 50 mg/dL   Triglycerides 518 <841 mg/dL   LDL Cholesterol (Calc) 96 mg/dL (calc)   Total CHOL/HDL Ratio 3.3 <5.0 (calc)   Non-HDL Cholesterol (Calc) 117 <130 mg/dL (calc)  TSH  Result Value Ref Range   TSH 2.10 mIU/L

## 2020-04-25 NOTE — Patient Instructions (Addendum)
It was good to see you again today!   I will be in touch with your labs Best of luck with your conception hopes- we will be thinking about you If you are interested in being treated for ADD at some point, please find a psychologist who can formally test and diagnose you with ADD if present.  With this information in hand we can start you on treatment if you like  You can get your covid vaccine 10 days after your symptoms began- ok to wait about 30 days if you like   Health Maintenance, Female Adopting a healthy lifestyle and getting preventive care are important in promoting health and wellness. Ask your health care provider about:  The right schedule for you to have regular tests and exams.  Things you can do on your own to prevent diseases and keep yourself healthy. What should I know about diet, weight, and exercise? Eat a healthy diet   Eat a diet that includes plenty of vegetables, fruits, low-fat dairy products, and lean protein.  Do not eat a lot of foods that are high in solid fats, added sugars, or sodium. Maintain a healthy weight Body mass index (BMI) is used to identify weight problems. It estimates body fat based on height and weight. Your health care provider can help determine your BMI and help you achieve or maintain a healthy weight. Get regular exercise Get regular exercise. This is one of the most important things you can do for your health. Most adults should:  Exercise for at least 150 minutes each week. The exercise should increase your heart rate and make you sweat (moderate-intensity exercise).  Do strengthening exercises at least twice a week. This is in addition to the moderate-intensity exercise.  Spend less time sitting. Even light physical activity can be beneficial. Watch cholesterol and blood lipids Have your blood tested for lipids and cholesterol at 30 years of age, then have this test every 5 years. Have your cholesterol levels checked more often  if:  Your lipid or cholesterol levels are high.  You are older than 30 years of age.  You are at high risk for heart disease. What should I know about cancer screening? Depending on your health history and family history, you may need to have cancer screening at various ages. This may include screening for:  Breast cancer.  Cervical cancer.  Colorectal cancer.  Skin cancer.  Lung cancer. What should I know about heart disease, diabetes, and high blood pressure? Blood pressure and heart disease  High blood pressure causes heart disease and increases the risk of stroke. This is more likely to develop in people who have high blood pressure readings, are of African descent, or are overweight.  Have your blood pressure checked: ? Every 3-5 years if you are 90-57 years of age. ? Every year if you are 76 years old or older. Diabetes Have regular diabetes screenings. This checks your fasting blood sugar level. Have the screening done:  Once every three years after age 77 if you are at a normal weight and have a low risk for diabetes.  More often and at a younger age if you are overweight or have a high risk for diabetes. What should I know about preventing infection? Hepatitis B If you have a higher risk for hepatitis B, you should be screened for this virus. Talk with your health care provider to find out if you are at risk for hepatitis B infection. Hepatitis C Testing is recommended  for:  Everyone born from [birth year] through 1966.  Anyone with known risk factors for hepatitis C. Sexually transmitted infections (STIs)  Get screened for STIs, including gonorrhea and chlamydia, if: ? You are sexually active and are younger than 30 years of age. ? You are older than 30 years of age and your health care provider tells you that you are at risk for this type of infection. ? Your sexual activity has changed since you were last screened, and you are at increased risk for chlamydia or  gonorrhea. Ask your health care provider if you are at risk.  Ask your health care provider about whether you are at high risk for HIV. Your health care provider may recommend a prescription medicine to help prevent HIV infection. If you choose to take medicine to prevent HIV, you should first get tested for HIV. You should then be tested every 3 months for as long as you are taking the medicine. Pregnancy  If you are about to stop having your period (premenopausal) and you may become pregnant, seek counseling before you get pregnant.  Take 400 to 800 micrograms (mcg) of folic acid every day if you become pregnant.  Ask for birth control (contraception) if you want to prevent pregnancy. Osteoporosis and menopause Osteoporosis is a disease in which the bones lose minerals and strength with aging. This can result in bone fractures. If you are 65 years old or older, or if you are at risk for osteoporosis and fractures, ask your health care provider if you should:  Be screened for bone loss.  Take a calcium or vitamin D supplement to lower your risk of fractures.  Be given hormone replacement therapy (HRT) to treat symptoms of menopause. Follow these instructions at home: Lifestyle  Do not use any products that contain nicotine or tobacco, such as cigarettes, e-cigarettes, and chewing tobacco. If you need help quitting, ask your health care provider.  Do not use street drugs.  Do not share needles.  Ask your health care provider for help if you need support or information about quitting drugs. Alcohol use  Do not drink alcohol if: ? Your health care provider tells you not to drink. ? You are pregnant, may be pregnant, or are planning to become pregnant.  If you drink alcohol: ? Limit how much you use to 0-1 drink a day. ? Limit intake if you are breastfeeding.  Be aware of how much alcohol is in your drink. In the [place] one drink equals one 12 oz bottle of beer (355 mL), one 5 oz  glass of wine (148 mL), or one 1 oz glass of hard liquor (44 mL). General instructions  Schedule regular health, dental, and eye exams.  Stay current with your vaccines.  Tell your health care provider if: ? You often feel depressed. ? You have ever been abused or do not feel safe at home. Summary  Adopting a healthy lifestyle and getting preventive care are important in promoting health and wellness.  Follow your health care provider's instructions about healthy diet, exercising, and getting tested or screened for diseases.  Follow your health care provider's instructions on monitoring your cholesterol and blood pressure. This information is not intended to replace advice given to you by your health care provider. Make sure you discuss any questions you have with your health care provider. Document Revised: 06/13/2018 Document Reviewed: 06/13/2018 Elsevier Patient Education  2020 [employer].

## 2020-04-27 ENCOUNTER — Other Ambulatory Visit: Payer: Self-pay

## 2020-04-27 ENCOUNTER — Encounter: Payer: Self-pay | Admitting: Family Medicine

## 2020-04-27 ENCOUNTER — Ambulatory Visit (INDEPENDENT_AMBULATORY_CARE_PROVIDER_SITE_OTHER): Payer: BC Managed Care – PPO | Admitting: Family Medicine

## 2020-04-27 VITALS — BP 124/88 | HR 94 | Resp 17 | Ht 67.0 in | Wt 242.0 lb

## 2020-04-27 DIAGNOSIS — Z131 Encounter for screening for diabetes mellitus: Secondary | ICD-10-CM

## 2020-04-27 DIAGNOSIS — Z1329 Encounter for screening for other suspected endocrine disorder: Secondary | ICD-10-CM | POA: Diagnosis not present

## 2020-04-27 DIAGNOSIS — Z13220 Encounter for screening for lipoid disorders: Secondary | ICD-10-CM | POA: Diagnosis not present

## 2020-04-27 DIAGNOSIS — Z0000 Encounter for general adult medical examination without abnormal findings: Secondary | ICD-10-CM | POA: Diagnosis not present

## 2020-04-27 DIAGNOSIS — F32A Depression, unspecified: Secondary | ICD-10-CM | POA: Diagnosis not present

## 2020-04-28 ENCOUNTER — Encounter: Payer: Self-pay | Admitting: Family Medicine

## 2020-04-28 LAB — HEMOGLOBIN A1C
Hgb A1c MFr Bld: 5.1 % of total Hgb (ref <5.7)
Mean Plasma Glucose: 100 (calc)
eAG (mmol/L): 5.5 (calc)

## 2020-04-28 LAB — COMPREHENSIVE METABOLIC PANEL
AG Ratio: 1.5 (calc) (ref 1.0–2.5)
ALT: 15 U/L (ref 6–29)
AST: 13 U/L (ref 10–30)
Albumin: 4 g/dL (ref 3.6–5.1)
Alkaline phosphatase (APISO): 58 U/L (ref 31–125)
BUN: 10 mg/dL (ref 7–25)
CO2: 27 mmol/L (ref 20–32)
Calcium: 9.7 mg/dL (ref 8.6–10.2)
Chloride: 104 mmol/L (ref 98–110)
Creat: 0.68 mg/dL (ref 0.50–1.10)
Globulin: 2.6 g/dL (calc) (ref 1.9–3.7)
Glucose, Bld: 85 mg/dL (ref 65–99)
Potassium: 4.7 mmol/L (ref 3.5–5.3)
Sodium: 137 mmol/L (ref 135–146)
Total Bilirubin: 0.4 mg/dL (ref 0.2–1.2)
Total Protein: 6.6 g/dL (ref 6.1–8.1)

## 2020-04-28 LAB — TSH: TSH: 2.1 mIU/L

## 2020-04-28 LAB — LIPID PANEL
Cholesterol: 167 mg/dL (ref <200)
HDL: 50 mg/dL (ref >=50)
LDL Cholesterol (Calc): 96 mg/dL (calc)
Non-HDL Cholesterol (Calc): 117 mg/dL (calc) (ref <130)
Total CHOL/HDL Ratio: 3.3 (calc) (ref <5.0)
Triglycerides: 113 mg/dL (ref <150)

## 2020-05-18 ENCOUNTER — Other Ambulatory Visit: Payer: Self-pay | Admitting: Family Medicine

## 2020-05-18 DIAGNOSIS — F419 Anxiety disorder, unspecified: Secondary | ICD-10-CM

## 2020-05-20 DIAGNOSIS — N979 Female infertility, unspecified: Secondary | ICD-10-CM | POA: Diagnosis not present

## 2020-05-25 DIAGNOSIS — N970 Female infertility associated with anovulation: Secondary | ICD-10-CM | POA: Diagnosis not present

## 2020-06-10 NOTE — Progress Notes (Signed)
[facility] [address] [place], [place] [address] [phone]  Date:  06/11/2020   Name:  Katherine [name]   DOB:  05-28-1991   MRN:  [id number]  PCP:  Pearline Cables, MD    Chief Complaint: Anxiety (Mental health concerns, zoloft not working/) and Ear Lump (Right ear, no pain, noticed last week/)   History of Present Illness:  Katherine [name] is a 29 y.o. very pleasant female patient who presents with the following:  Patient following up today to discuss anxiety Last seen by myself for physical exam on October 25 At that time she and her husband have been trying to conceive for about 18 months She was using sertraline for depression.  She also had some concerns about ADHD but had not been formally diagnosed Routine labs all looked good at that time  Today pt notes that she is having a lot of difficulty completing tasks, and hs lack of interest  They are still trying to get pregnant so this continues to be a stress for her  She feels more depressed than anything else- anxiety is not a big concern for her No SI   She has noted a small bump behind her right ear as well-she is not quite sure how long it has been there, it is slightly tender.  No recent sore throat or cough, no fever noted  She uses femara on a cyclical basis per her fertility specialist  Flu vaccine-not done yet COVID-19 vaccine-not done yet, she did have COVID-19 back in October I encouraged patient to have her COVID-19 and flu vaccines as soon as possible  Patient Active Problem List   Diagnosis Date Noted  . Overweight 01/14/2016    Past Medical History:  Diagnosis Date  . Anxiety   . Asthma   . Depression   . Elevated blood pressure reading   . Frequent headaches   . Recurrent UTI     Past Surgical History:  Procedure Laterality Date  . DILATION AND EVACUATION N/A 02/11/2020   Procedure: DILATATION AND EVACUATION;   Surgeon: Mitchel Honour, DO;  Location: [facility] OR;  Service: Gynecology;  Laterality: N/A;    Social History   Tobacco Use  . Smoking status: Never Smoker  . Smokeless tobacco: Never Used  Vaping Use  . Vaping Use: Never used  Substance Use Topics  . Alcohol use: Yes    Alcohol/week: 2.0 - 3.0 standard drinks    Types: 2 - 3 Standard drinks or equivalent per week    Comment: occassionally  . Drug use: No    Family History  Problem Relation Age of Onset  . Hypertension Father   . Hypertension Brother     Allergies  Allergen Reactions  . Prednisone Other (See Comments)    Pt states "It makes me feel weird.    Medication list has been reviewed and updated.  Current Outpatient Medications on File Prior to Visit  Medication Sig Dispense Refill  . Prenatal MV & Min w/FA-DHA (PRENATAL ADULT GUMMY/DHA/FA PO) Take 2 tablets by mouth at bedtime. Gummies    . sertraline (ZOLOFT) 100 MG tablet Take 1 tablet (100 mg total) by mouth at bedtime. 90 tablet 3  . letrozole (FEMARA) 2.5 MG tablet Take 2.5 mg by mouth daily. (Patient not taking: Reported on 06/11/2020)     No current facility-administered medications on file prior to visit.    Review of Systems:  As per HPI- otherwise negative.   Physical Examination: Vitals:   06/11/20 1028  BP: 112/82  Pulse: 88  Resp: 18  SpO2: 97%   Vitals:   06/11/20 1028  Weight: 246 lb (111.6 kg)  Height: 5\' 7"  (1.702 m)   Body mass index is 38.53 kg/m. Ideal Body Weight: Weight in (lb) to have BMI = 25: 159.3  GEN: no acute distress.  Obese, otherwise looks well HEENT: Atraumatic, Normocephalic.   Bilateral TM wnl, oropharynx normal.  PEERL,EOMI. she has a very small right-sided postauricular lymph node, it is firm and mobile.  Less than 1 cm in diameter Ears and Nose: No external deformity. CV: RRR, No M/G/R. No JVD. No thrill. No extra heart sounds. PULM: CTA B, no wheezes, crackles, rhonchi. No retractions. No resp. distress. No  accessory muscle use. EXTR: No c/c/e PSYCH: Normally interactive. Conversant.    Assessment and Plan: Recurrent major depressive disorder, in partial remission (HCC) - Plan: buPROPion (WELLBUTRIN SR) 150 MG 12 hr tablet  Posterior auricular lymphadenopathy   Patient here today for a follow-up visit.  She notes postauricular lymphadenopathy on the right-hand side.  This is most likely reactive.  I asked patient to please let me know if this does not resolve over the next month and she agrees  Currently taking sertraline for depression, patient suspects that she may have ADHD.  She notes more difficulty with task completion, some anhedonia.  Is difficult for her to get organized and this is causing some strife between herself and her husband.  She would like to try changing from sertraline to a different medication in hopes of improving her mood and ADHD symptoms  We will have her gradually taper off sertraline and then start on Wellbutrin.  See patient instructions for details about taper.  I have asked her to update me in 1 month, sooner if not doing okay.  We also discussed doing counseling-individual and/or couples-I think this would be helpful This visit occurred during the SARS-CoV-2 public health emergency.  Safety protocols were in place, including screening questions prior to the visit, additional usage of staff PPE, and extensive cleaning of exam room while observing appropriate contact time as indicated for disinfecting solutions.    Signed , MD

## 2020-06-11 ENCOUNTER — Encounter: Payer: Self-pay | Admitting: Family Medicine

## 2020-06-11 ENCOUNTER — Other Ambulatory Visit: Payer: Self-pay

## 2020-06-11 ENCOUNTER — Ambulatory Visit (INDEPENDENT_AMBULATORY_CARE_PROVIDER_SITE_OTHER): Payer: BC Managed Care – PPO | Admitting: Family Medicine

## 2020-06-11 VITALS — BP 112/82 | HR 88 | Resp 18 | Ht 67.0 in | Wt 246.0 lb

## 2020-06-11 DIAGNOSIS — F3341 Major depressive disorder, recurrent, in partial remission: Secondary | ICD-10-CM | POA: Diagnosis not present

## 2020-06-11 DIAGNOSIS — R590 Localized enlarged lymph nodes: Secondary | ICD-10-CM

## 2020-06-11 MED ORDER — BUPROPION HCL ER (SR) 150 MG PO TB12: 150.0000 mg | ORAL_TABLET | Freq: Two times a day (BID) | ORAL | 3 refills | Status: DC

## 2020-06-11 NOTE — Patient Instructions (Addendum)
It was good to see you today-  Please keep me posted about how you are feeling! Let's taper off the sertraline- take 1/2 tablet for one week, then 1/2 tablet every other day for 2-3 doses- then stop and begin wellbutrin once a day for 3 days, then increase to twice a day   Please let your fertility doc know about this med change.  I don't think it will be a concern at least for now I do think that couples counseling may be helpful, and look into getting a formal ADHD evaluation  Please update me in one month. If the little bump behind your ear does not go away in a month let me know

## 2020-06-22 DIAGNOSIS — N979 Female infertility, unspecified: Secondary | ICD-10-CM | POA: Diagnosis not present

## 2020-06-23 DIAGNOSIS — Z20822 Contact with and (suspected) exposure to covid-19: Secondary | ICD-10-CM | POA: Diagnosis not present

## 2020-06-24 DIAGNOSIS — N979 Female infertility, unspecified: Secondary | ICD-10-CM | POA: Diagnosis not present

## 2020-06-25 DIAGNOSIS — N979 Female infertility, unspecified: Secondary | ICD-10-CM | POA: Diagnosis not present

## 2020-06-29 DIAGNOSIS — U071 COVID-19: Secondary | ICD-10-CM | POA: Diagnosis not present

## 2020-06-29 DIAGNOSIS — Z20822 Contact with and (suspected) exposure to covid-19: Secondary | ICD-10-CM | POA: Diagnosis not present

## 2020-07-04 NOTE — L&D Delivery Note (Signed)
PROCEDURE DATE: 03/01/2021   PREOPERATIVE DIAGNOSIS: Severe preeclampsia, IUGR   POSTOPERATIVE DIAGNOSIS: The same   PROCEDURE:    Primary Low Transverse Cesarean Section   SURGEON:  Dr. Nilda Simmer   INDICATIONS: This is a 30yo G2P0010 at [redacted]w[redacted]d wga requiring cesarean section secondary to severe preeclampsia (severe range BP and headache) as well as IUGR. Her blood pressure had shown progression and she required increased antihypertensive therapy.  FHT also revealed late decelerations. She was steroid complete following course on 8/24 and 8/25. She received 12 hrs of neuroprotective magnesium immediately prior to delivery.  Decision made to proceed with LTCS. The risks of cesarean section discussed with the patient included but were not limited to: bleeding which may require transfusion or reoperation; infection which may require antibiotics; injury to bowel, bladder, ureters or other surrounding organs; injury to the fetus; need for additional procedures including hysterectomy in the event of a life-threatening hemorrhage; placental abnormalities wth subsequent pregnancies, incisional problems, thromboembolic phenomenon and other postoperative/anesthesia complications. The patient agreed with the proposed plan, giving informed consent for the procedure.    She was also counseled regarding need for classical incision for delivery and the implications of such.   FINDINGS:  Viable female infant in breech presentation, APGARs 7, 9,  Weight pending, Amniotic fluid clear,  Intact placenta, three vessel cord.  Grossly normal uterus. .   ANESTHESIA:    Epidural ESTIMATED BLOOD LOSS: 500 cc SPECIMENS: Placenta for pathology COMPLICATIONS: None immediate    PROCEDURE IN DETAIL:  The patient received intravenous antibiotics (2g Ancef) and had sequential compression devices applied to her lower extremities while in the preoperative area.  She was then taken to the operating room where epidural  anesthesia was dosed up to surgical level and was found to be adequate. She was then placed in a dorsal supine position with a leftward tilt, and prepped and draped in a sterile manner.  A foley catheter was placed into her bladder and attached to constant gravity.  After an adequate timeout was performed, a Pfannenstiel skin incision was made with scalpel and carried through to the underlying layer of fascia. The fascia was incised in the midline and this incision was extended bilaterally using the Mayo scissors. Kocher clamps were applied to the superior aspect of the fascial incision and the underlying rectus muscles were dissected off bluntly. A similar process was carried out on the inferior aspect of the facial incision. The rectus muscles were separated in the midline bluntly and the peritoneum was entered bluntly.  A bladder flap was created sharply and developed bluntly. A transverse hysterotomy was made with a scalpel and extended bilaterally bluntly. The bladder blade was then removed. The infant was successfully delivered via breech maneuvers, and cord was clamped and cut and infant was handed over to awaiting neonatology team. Uterine massage was then administered and the placenta delivered intact with three-vessel cord. Cord gases were taken. The uterus was cleared of clot and debris.  The hysterotomy was closed with 0 vicryl.  A second imbricating suture of 0-vicryl was used to reinforce the incision and aid in hemostasis.The fascia was closed with 0-Vicryl in a running fashion with good restoration of anatomy.  The subcutaneus tissue was irrigated and was reapproximated using three interrupted plain gut stitches.  The skin was closed with 4-0 Vicryl in a subcuticular fashion.  All surgical site and was hemostatic at end of procedure without any further bleeding on exam.    Pt tolerated the procedure  well. All sponge/lap/needle counts were correct  X 2. Pt taken to recovery room in stable  condition.     Austin Jamaul Heist MD  

## 2020-07-22 ENCOUNTER — Encounter: Payer: Self-pay | Admitting: Family Medicine

## 2020-07-22 DIAGNOSIS — R590 Localized enlarged lymph nodes: Secondary | ICD-10-CM

## 2020-07-23 DIAGNOSIS — N979 Female infertility, unspecified: Secondary | ICD-10-CM | POA: Diagnosis not present

## 2020-07-23 NOTE — Addendum Note (Signed)
Addended by: Abbe Amsterdam C on: 07/23/2020 06:04 AM   Modules accepted: Orders

## 2020-07-27 DIAGNOSIS — N6320 Unspecified lump in the left breast, unspecified quadrant: Secondary | ICD-10-CM | POA: Diagnosis not present

## 2020-07-27 DIAGNOSIS — N979 Female infertility, unspecified: Secondary | ICD-10-CM | POA: Diagnosis not present

## 2020-07-29 ENCOUNTER — Ambulatory Visit (HOSPITAL_BASED_OUTPATIENT_CLINIC_OR_DEPARTMENT_OTHER)
Admission: RE | Admit: 2020-07-29 | Discharge: 2020-07-29 | Disposition: A | Discharge status: Home / Self Care | Payer: BC Managed Care – PPO | Source: Ambulatory Visit | Attending: Family Medicine | Admitting: Family Medicine

## 2020-07-29 ENCOUNTER — Other Ambulatory Visit: Payer: Self-pay

## 2020-07-29 DIAGNOSIS — R590 Localized enlarged lymph nodes: Secondary | ICD-10-CM | POA: Diagnosis not present

## 2020-08-03 DIAGNOSIS — N6324 Unspecified lump in the left breast, lower inner quadrant: Secondary | ICD-10-CM | POA: Diagnosis not present

## 2020-08-03 DIAGNOSIS — N979 Female infertility, unspecified: Secondary | ICD-10-CM | POA: Diagnosis not present

## 2020-08-18 DIAGNOSIS — N979 Female infertility, unspecified: Secondary | ICD-10-CM | POA: Diagnosis not present

## 2020-08-21 DIAGNOSIS — N979 Female infertility, unspecified: Secondary | ICD-10-CM | POA: Diagnosis not present

## 2020-08-24 DIAGNOSIS — N979 Female infertility, unspecified: Secondary | ICD-10-CM | POA: Diagnosis not present

## 2020-08-26 DIAGNOSIS — N979 Female infertility, unspecified: Secondary | ICD-10-CM | POA: Diagnosis not present

## 2020-09-02 DIAGNOSIS — N979 Female infertility, unspecified: Secondary | ICD-10-CM | POA: Diagnosis not present

## 2020-09-08 DIAGNOSIS — N912 Amenorrhea, unspecified: Secondary | ICD-10-CM | POA: Diagnosis not present

## 2020-09-14 DIAGNOSIS — N979 Female infertility, unspecified: Secondary | ICD-10-CM | POA: Diagnosis not present

## 2020-09-25 DIAGNOSIS — N911 Secondary amenorrhea: Secondary | ICD-10-CM | POA: Diagnosis not present

## 2020-10-06 DIAGNOSIS — N911 Secondary amenorrhea: Secondary | ICD-10-CM | POA: Diagnosis not present

## 2020-10-09 DIAGNOSIS — Z3685 Encounter for antenatal screening for Streptococcus B: Secondary | ICD-10-CM | POA: Diagnosis not present

## 2020-10-09 DIAGNOSIS — Z3481 Encounter for supervision of other normal pregnancy, first trimester: Secondary | ICD-10-CM | POA: Diagnosis not present

## 2020-10-09 LAB — OB RESULTS CONSOLE RUBELLA ANTIBODY, IGM: Rubella: IMMUNE

## 2020-10-09 LAB — OB RESULTS CONSOLE RPR: RPR: NONREACTIVE

## 2020-10-09 LAB — OB RESULTS CONSOLE HEPATITIS B SURFACE ANTIGEN: Hepatitis B Surface Ag: NEGATIVE

## 2020-10-12 ENCOUNTER — Other Ambulatory Visit: Payer: Self-pay

## 2020-11-05 DIAGNOSIS — Z3400 Encounter for supervision of normal first pregnancy, unspecified trimester: Secondary | ICD-10-CM | POA: Diagnosis not present

## 2020-11-05 DIAGNOSIS — Z113 Encounter for screening for infections with a predominantly sexual mode of transmission: Secondary | ICD-10-CM | POA: Diagnosis not present

## 2020-11-13 ENCOUNTER — Encounter: Payer: Self-pay | Admitting: Family Medicine

## 2020-11-13 DIAGNOSIS — F419 Anxiety disorder, unspecified: Secondary | ICD-10-CM

## 2020-11-13 MED ORDER — SERTRALINE HCL 100 MG PO TABS: 100.0000 mg | ORAL_TABLET | Freq: Every day | ORAL | 3 refills | Status: DC

## 2020-11-13 NOTE — Addendum Note (Signed)
Addended by: Abbe Amsterdam C on: 11/13/2020 02:17 PM   Modules accepted: Orders

## 2020-11-13 NOTE — Telephone Encounter (Signed)
See previous message from patient. Per OBGYN patient needs to stay on sertraline. She did not start wellbutrin as she is now pregnant. I have pended sertraline refill request. Please sign if ok with no visit to discuss switching back--  Last follow up with you 06/11/2020 she is UTD on visits.

## 2020-11-13 NOTE — Addendum Note (Signed)
Addended by: Steve Rattler A on: 11/13/2020 11:37 AM   Modules accepted: Orders

## 2020-11-13 NOTE — Addendum Note (Signed)
Addended by: Steve Rattler A on: 11/13/2020 11:36 AM   Modules accepted: Orders

## 2020-11-17 ENCOUNTER — Encounter: Payer: Self-pay | Admitting: Obstetrics & Gynecology

## 2020-11-17 DIAGNOSIS — Z3682 Encounter for antenatal screening for nuchal translucency: Secondary | ICD-10-CM | POA: Diagnosis not present

## 2020-11-17 DIAGNOSIS — Z3481 Encounter for supervision of other normal pregnancy, first trimester: Secondary | ICD-10-CM | POA: Diagnosis not present

## 2020-11-17 DIAGNOSIS — Z3A13 13 weeks gestation of pregnancy: Secondary | ICD-10-CM | POA: Diagnosis not present

## 2020-11-24 ENCOUNTER — Other Ambulatory Visit: Payer: Self-pay | Admitting: Obstetrics & Gynecology

## 2020-12-09 ENCOUNTER — Ambulatory Visit: Payer: BC Managed Care – PPO

## 2020-12-30 DIAGNOSIS — Z363 Encounter for antenatal screening for malformations: Secondary | ICD-10-CM | POA: Diagnosis not present

## 2020-12-30 DIAGNOSIS — Z3A19 19 weeks gestation of pregnancy: Secondary | ICD-10-CM | POA: Diagnosis not present

## 2020-12-31 DIAGNOSIS — J069 Acute upper respiratory infection, unspecified: Secondary | ICD-10-CM | POA: Diagnosis not present

## 2021-01-26 DIAGNOSIS — Z3A23 23 weeks gestation of pregnancy: Secondary | ICD-10-CM | POA: Diagnosis not present

## 2021-01-26 DIAGNOSIS — Z362 Encounter for other antenatal screening follow-up: Secondary | ICD-10-CM | POA: Diagnosis not present

## 2021-01-26 DIAGNOSIS — N760 Acute vaginitis: Secondary | ICD-10-CM | POA: Diagnosis not present

## 2021-02-04 DIAGNOSIS — R350 Frequency of micturition: Secondary | ICD-10-CM | POA: Diagnosis not present

## 2021-02-05 ENCOUNTER — Other Ambulatory Visit: Payer: Self-pay | Admitting: Obstetrics & Gynecology

## 2021-02-05 DIAGNOSIS — Z363 Encounter for antenatal screening for malformations: Secondary | ICD-10-CM

## 2021-02-05 DIAGNOSIS — O283 Abnormal ultrasonic finding on antenatal screening of mother: Secondary | ICD-10-CM

## 2021-02-10 DIAGNOSIS — R030 Elevated blood-pressure reading, without diagnosis of hypertension: Secondary | ICD-10-CM | POA: Diagnosis not present

## 2021-02-17 ENCOUNTER — Other Ambulatory Visit: Payer: Self-pay

## 2021-02-17 ENCOUNTER — Encounter (HOSPITAL_COMMUNITY): Payer: Self-pay | Admitting: Obstetrics and Gynecology

## 2021-02-17 ENCOUNTER — Inpatient Hospital Stay (HOSPITAL_COMMUNITY)
Admission: AD | Admit: 2021-02-17 | Discharge: 2021-02-17 | Disposition: A | Discharge status: Home / Self Care | Payer: BC Managed Care – PPO | Attending: Obstetrics and Gynecology | Admitting: Obstetrics and Gynecology

## 2021-02-17 DIAGNOSIS — R102 Pelvic and perineal pain: Secondary | ICD-10-CM | POA: Diagnosis not present

## 2021-02-17 DIAGNOSIS — Z79899 Other long term (current) drug therapy: Secondary | ICD-10-CM | POA: Insufficient documentation

## 2021-02-17 DIAGNOSIS — N949 Unspecified condition associated with female genital organs and menstrual cycle: Secondary | ICD-10-CM

## 2021-02-17 DIAGNOSIS — Z3A26 26 weeks gestation of pregnancy: Secondary | ICD-10-CM | POA: Diagnosis not present

## 2021-02-17 DIAGNOSIS — O26892 Other specified pregnancy related conditions, second trimester: Secondary | ICD-10-CM

## 2021-02-17 DIAGNOSIS — N9489 Other specified conditions associated with female genital organs and menstrual cycle: Secondary | ICD-10-CM | POA: Diagnosis not present

## 2021-02-17 LAB — PROTEIN / CREATININE RATIO, URINE
Creatinine, Urine: 147.99 mg/dL
Protein Creatinine Ratio: 0.09 mg/mg{Cre} (ref 0.00–0.15)
Total Protein, Urine: 13 mg/dL

## 2021-02-17 LAB — COMPREHENSIVE METABOLIC PANEL
ALT: 13 U/L (ref 0–44)
AST: 16 U/L (ref 15–41)
Albumin: 2.7 g/dL — ABNORMAL LOW (ref 3.5–5.0)
Alkaline Phosphatase: 61 U/L (ref 38–126)
Anion gap: 7 (ref 5–15)
BUN: 10 mg/dL (ref 6–20)
CO2: 22 mmol/L (ref 22–32)
Calcium: 9 mg/dL (ref 8.9–10.3)
Chloride: 105 mmol/L (ref 98–111)
Creatinine, Ser: 0.65 mg/dL (ref 0.44–1.00)
GFR, Estimated: >60 mL/min (ref >60)
Glucose, Bld: 85 mg/dL (ref 70–99)
Potassium: 4.5 mmol/L (ref 3.5–5.1)
Sodium: 134 mmol/L — ABNORMAL LOW (ref 135–145)
Total Bilirubin: 0.5 mg/dL (ref 0.3–1.2)
Total Protein: 5.8 g/dL — ABNORMAL LOW (ref 6.5–8.1)

## 2021-02-17 LAB — URINALYSIS, ROUTINE W REFLEX MICROSCOPIC
Bilirubin Urine: NEGATIVE
Glucose, UA: NEGATIVE mg/dL
Hgb urine dipstick: NEGATIVE
Ketones, ur: NEGATIVE mg/dL
Leukocytes,Ua: NEGATIVE
Nitrite: NEGATIVE
Protein, ur: NEGATIVE mg/dL
Specific Gravity, Urine: 1.017 (ref 1.005–1.030)
pH: 6 (ref 5.0–8.0)

## 2021-02-17 LAB — CBC
HCT: 36.2 % (ref 36.0–46.0)
Hemoglobin: 12.5 g/dL (ref 12.0–15.0)
MCH: 31 pg (ref 26.0–34.0)
MCHC: 34.5 g/dL (ref 30.0–36.0)
MCV: 89.8 fL (ref 80.0–100.0)
Platelets: 188 10*3/uL (ref 150–400)
RBC: 4.03 MIL/uL (ref 3.87–5.11)
RDW: 13.2 % (ref 11.5–15.5)
WBC: 11.5 10*3/uL — ABNORMAL HIGH (ref 4.0–10.5)
nRBC: 0 % (ref 0.0–0.2)

## 2021-02-17 NOTE — MAU Provider Note (Signed)
History     CSN: [id number]  Arrival date and time: 02/17/21 1642   Event Date/Time   First Provider Initiated Contact with Patient 02/17/21 1801      Chief Complaint  Patient presents with   Abdominal Pain   Katherine [name] is a 30 y.o. year old G74P0010 female at [redacted]w[redacted]d weeks gestation who presents to MAU reporting sharp, pain on her RT side that started yesterday. She reports the pain increases with movement and relieves with rest/lying down. She reports (+) FM. She denies VB, LOF or abnormal vaginal d/c. Her pregnancy is complicated by elevated blood pressures and high risk NIPS. She reports she had BP checks twice in the office last week with normal PEC labs. She receives [facility] with [facility]; next appt (BP check) is 02/18/2021. Her spouse is present and contributing to the history taking.    OB History     Gravida  2   Para      Term      Preterm      AB  1   Living         SAB  1   IAB      Ectopic      Multiple      Live Births              Past Medical History:  Diagnosis Date   Anxiety    Asthma    Depression    Elevated blood pressure reading    Frequent headaches    Recurrent UTI     Past Surgical History:  Procedure Laterality Date   DILATION AND EVACUATION N/A 02/11/2020   Procedure: DILATATION AND EVACUATION;  Surgeon: Mitchel Honour, DO;  Location: [facility] OR;  Service: Gynecology;  Laterality: N/A;    Family History  Problem Relation Age of Onset   Hypertension Father    Hypertension Brother     Social History   Tobacco Use   Smoking status: Never   Smokeless tobacco: Never  Vaping Use   Vaping Use: Never used  Substance Use Topics   Alcohol use: Yes    Alcohol/week: 2.0 - 3.0 standard drinks    Types: 2 - 3 Standard drinks or equivalent per week    Comment: occassionally   Drug use: No    Allergies:  Allergies  Allergen Reactions   Prednisone Other (See Comments)    Pt states "It makes me feel weird.     Medications Prior to Admission  Medication Sig Dispense Refill Last Dose   aspirin EC 81 MG tablet Take 81 mg by mouth daily. Swallow whole.   02/17/2021   Magnesium 100 MG CAPS Take by mouth.   02/16/2021   Prenatal MV & Min w/FA-DHA (PRENATAL ADULT GUMMY/DHA/FA PO) Take 2 tablets by mouth at bedtime. Gummies   02/16/2021   sertraline (ZOLOFT) 100 MG tablet Take 1 tablet (100 mg total) by mouth at bedtime. 90 tablet 3 02/16/2021   sertraline (ZOLOFT) 100 MG tablet Take 100 mg by mouth daily.      Turmeric (QC TUMERIC COMPLEX PO) Take by mouth.      UNABLE TO FIND Med Name: Vitamin C gummies      Zinc 50 MG CAPS Take by mouth.       Review of Systems  Constitutional: Negative.   HENT: Negative.    Eyes: Negative.   Respiratory: Negative.    Cardiovascular: Negative.   Gastrointestinal: Negative.   Endocrine: Negative.  Genitourinary:  Positive for pelvic pain (RT lower pelvic, sharp pain; increases with mvmt, improves with rest/lying down).  Musculoskeletal: Negative.   Skin: Negative.   Allergic/Immunologic: Negative.   Neurological: Negative.   Hematological: Negative.   Psychiatric/Behavioral: Negative.    Physical Exam   Patient Vitals for the past 24 hrs:  BP Pulse Resp SpO2 Height Weight  02/17/21 1930 133/88 61 -- 98 % -- --  02/17/21 1915 128/86 60 -- 99 % -- --  02/17/21 1901 129/88 (!) 55 -- -- -- --  02/17/21 1846 131/87 60 -- -- -- --  02/17/21 1831 (!) 134/91 (!) 59 -- -- -- --  02/17/21 1816 (!) 142/94 63 -- -- -- --  02/17/21 1801 135/90 (!) 55 -- -- -- --  02/17/21 1750 (!) 135/99 (!) 55 -- 100 % -- --  02/17/21 1725 (!) 140/98 66 18 -- 5\' 7"  (1.702 m) 106.6 kg   Physical Exam Vitals and nursing note reviewed.  Constitutional:      Appearance: Normal appearance. She is obese.  Cardiovascular:     Rate and Rhythm: Bradycardia present.  Pulmonary:     Effort: Pulmonary effort is normal.  Abdominal:     Palpations: Abdomen is soft.  Genitourinary:     Comments: Not indicated Musculoskeletal:        General: Normal range of motion.  Skin:    General: Skin is warm and dry.  Neurological:     Mental Status: She is alert and oriented to person, place, and time.  Psychiatric:        Mood and Affect: Mood normal.        Behavior: Behavior normal.        Thought Content: Thought content normal.        Judgment: Judgment normal.   REACTIVE NST - FHR: 150 bpm / moderate variability / accels present / decels absent / TOCO: none  MAU Course  Procedures  MDM CCUA CBC CMP P/C Ratio Serial BP's   Results for orders placed or performed during the hospital encounter of 02/17/21 (from the past 24 hour(s))  Urinalysis, Routine w reflex microscopic Urine, Clean Catch     Status: Abnormal   Collection Time: 02/17/21  6:00 PM  Result Value Ref Range   Color, Urine YELLOW YELLOW   APPearance HAZY (A) CLEAR   Specific Gravity, Urine 1.017 1.005 - 1.030   pH 6.0 5.0 - 8.0   Glucose, UA NEGATIVE NEGATIVE mg/dL   Hgb urine dipstick NEGATIVE NEGATIVE   Bilirubin Urine NEGATIVE NEGATIVE   Ketones, ur NEGATIVE NEGATIVE mg/dL   Protein, ur NEGATIVE NEGATIVE mg/dL   Nitrite NEGATIVE NEGATIVE   Leukocytes,Ua NEGATIVE NEGATIVE  Protein / creatinine ratio, urine     Status: None   Collection Time: 02/17/21  6:19 PM  Result Value Ref Range   Creatinine, Urine 147.99 mg/dL   Total Protein, Urine 13 mg/dL   Protein Creatinine Ratio 0.09 0.00 - 0.15 mg/mg[Cre]  CBC     Status: Abnormal   Collection Time: 02/17/21  6:30 PM  Result Value Ref Range   WBC 11.5 (H) 4.0 - 10.5 K/uL   RBC 4.03 3.87 - 5.11 MIL/uL   Hemoglobin 12.5 12.0 - 15.0 g/dL   HCT 02/19/21 15.1 - 76.1 %   MCV 89.8 80.0 - 100.0 fL   MCH 31.0 26.0 - 34.0 pg   MCHC 34.5 30.0 - 36.0 g/dL   RDW 60.7 37.1 - 06.2 %   Platelets 188  150 - 400 K/uL   nRBC 0.0 0.0 - 0.2 %  Comprehensive metabolic panel     Status: Abnormal   Collection Time: 02/17/21  6:30 PM  Result Value Ref Range    Sodium 134 (L) 135 - 145 mmol/L   Potassium 4.5 3.5 - 5.1 mmol/L   Chloride 105 98 - 111 mmol/L   CO2 22 22 - 32 mmol/L   Glucose, Bld 85 70 - 99 mg/dL   BUN 10 6 - 20 mg/dL   Creatinine, Ser 7.51 0.44 - 1.00 mg/dL   Calcium 9.0 8.9 - 02.5 mg/dL   Total Protein 5.8 (L) 6.5 - 8.1 g/dL   Albumin 2.7 (L) 3.5 - 5.0 g/dL   AST 16 15 - 41 U/L   ALT 13 0 - 44 U/L   Alkaline Phosphatase 61 38 - 126 U/L   Total Bilirubin 0.5 0.3 - 1.2 mg/dL   GFR, Estimated >85 >27 mL/min   Anion gap 7 5 - 15      Assessment and Plan  Pelvic pain affecting pregnancy in second trimester, antepartum  - Information provided on RLP   2. Round ligament pain - Information provided on RLP - Advised to purchase a maternity support belt from [employer]. Wear it all day (excluding showering and sleeping).  - Soak in a tub of warm water with 1/2 Epsom salt x 15 mins twice daily or at least once daily.  3. [redacted] weeks gestation of pregnancy   - Discharge patient - Keep scheduled appt with [facility] on 02/18/2021 - Patient verbalized an understanding of the plan of care and agrees.    Katherine [name], CNM 02/17/2021, 6:01 PM

## 2021-02-17 NOTE — MAU Note (Signed)
Pt reports she stare having sharp pain in her right side that started yesterday. When she lays down its not too bad but when she gets up and moves around it is painful.  Good fetal movement felt. Denies vag bleeding or leaking or discharge.

## 2021-02-17 NOTE — Discharge Instructions (Signed)
Purchase a maternity support belt from [employer]. Wear it all day (excluding showering and sleeping). Soak in a tub of warm water with 1/2 Epsom salt x 15 mins twice daily or at least once daily.

## 2021-02-18 ENCOUNTER — Encounter: Payer: Self-pay | Admitting: Obstetrics and Gynecology

## 2021-02-18 DIAGNOSIS — B373 Candidiasis of vulva and vagina: Secondary | ICD-10-CM | POA: Diagnosis not present

## 2021-02-18 DIAGNOSIS — N760 Acute vaginitis: Secondary | ICD-10-CM | POA: Diagnosis not present

## 2021-02-18 DIAGNOSIS — O139 Gestational [pregnancy-induced] hypertension without significant proteinuria, unspecified trimester: Secondary | ICD-10-CM | POA: Insufficient documentation

## 2021-02-22 ENCOUNTER — Inpatient Hospital Stay (EMERGENCY_DEPARTMENT_HOSPITAL)
Admission: AD | Admit: 2021-02-22 | Discharge: 2021-02-22 | Disposition: A | Discharge status: Home / Self Care | Payer: BC Managed Care – PPO | Source: Home / Self Care | Attending: Obstetrics & Gynecology | Admitting: Obstetrics & Gynecology

## 2021-02-22 ENCOUNTER — Encounter (HOSPITAL_COMMUNITY): Payer: Self-pay | Admitting: Obstetrics & Gynecology

## 2021-02-22 ENCOUNTER — Other Ambulatory Visit: Payer: Self-pay

## 2021-02-22 DIAGNOSIS — O365920 Maternal care for other known or suspected poor fetal growth, second trimester, not applicable or unspecified: Secondary | ICD-10-CM | POA: Diagnosis not present

## 2021-02-22 DIAGNOSIS — O99892 Other specified diseases and conditions complicating childbirth: Secondary | ICD-10-CM | POA: Diagnosis not present

## 2021-02-22 DIAGNOSIS — O365931 Maternal care for other known or suspected poor fetal growth, third trimester, fetus 1: Secondary | ICD-10-CM | POA: Diagnosis not present

## 2021-02-22 DIAGNOSIS — Z888 Allergy status to other drugs, medicaments and biological substances status: Secondary | ICD-10-CM | POA: Insufficient documentation

## 2021-02-22 DIAGNOSIS — Z3A27 27 weeks gestation of pregnancy: Secondary | ICD-10-CM | POA: Diagnosis not present

## 2021-02-22 DIAGNOSIS — Z79899 Other long term (current) drug therapy: Secondary | ICD-10-CM | POA: Insufficient documentation

## 2021-02-22 DIAGNOSIS — O10012 Pre-existing essential hypertension complicating pregnancy, second trimester: Secondary | ICD-10-CM | POA: Diagnosis not present

## 2021-02-22 DIAGNOSIS — R001 Bradycardia, unspecified: Secondary | ICD-10-CM | POA: Diagnosis not present

## 2021-02-22 DIAGNOSIS — Z7982 Long term (current) use of aspirin: Secondary | ICD-10-CM | POA: Insufficient documentation

## 2021-02-22 DIAGNOSIS — Z3A28 28 weeks gestation of pregnancy: Secondary | ICD-10-CM | POA: Diagnosis not present

## 2021-02-22 DIAGNOSIS — Z20822 Contact with and (suspected) exposure to covid-19: Secondary | ICD-10-CM | POA: Diagnosis not present

## 2021-02-22 DIAGNOSIS — Z23 Encounter for immunization: Secondary | ICD-10-CM | POA: Diagnosis not present

## 2021-02-22 DIAGNOSIS — Z87440 Personal history of urinary (tract) infections: Secondary | ICD-10-CM | POA: Insufficient documentation

## 2021-02-22 DIAGNOSIS — R638 Other symptoms and signs concerning food and fluid intake: Secondary | ICD-10-CM | POA: Diagnosis not present

## 2021-02-22 DIAGNOSIS — O1412 Severe pre-eclampsia, second trimester: Secondary | ICD-10-CM | POA: Diagnosis not present

## 2021-02-22 DIAGNOSIS — O99344 Other mental disorders complicating childbirth: Secondary | ICD-10-CM | POA: Diagnosis not present

## 2021-02-22 DIAGNOSIS — O132 Gestational [pregnancy-induced] hypertension without significant proteinuria, second trimester: Secondary | ICD-10-CM

## 2021-02-22 DIAGNOSIS — Q046 Congenital cerebral cysts: Secondary | ICD-10-CM | POA: Diagnosis not present

## 2021-02-22 DIAGNOSIS — O99342 Other mental disorders complicating pregnancy, second trimester: Secondary | ICD-10-CM | POA: Insufficient documentation

## 2021-02-22 DIAGNOSIS — O169 Unspecified maternal hypertension, unspecified trimester: Secondary | ICD-10-CM | POA: Diagnosis not present

## 2021-02-22 DIAGNOSIS — K219 Gastro-esophageal reflux disease without esophagitis: Secondary | ICD-10-CM | POA: Diagnosis not present

## 2021-02-22 DIAGNOSIS — Z3A00 Weeks of gestation of pregnancy not specified: Secondary | ICD-10-CM | POA: Diagnosis not present

## 2021-02-22 DIAGNOSIS — J029 Acute pharyngitis, unspecified: Secondary | ICD-10-CM | POA: Diagnosis not present

## 2021-02-22 DIAGNOSIS — R609 Edema, unspecified: Secondary | ICD-10-CM | POA: Diagnosis not present

## 2021-02-22 DIAGNOSIS — R030 Elevated blood-pressure reading, without diagnosis of hypertension: Secondary | ICD-10-CM | POA: Diagnosis not present

## 2021-02-22 DIAGNOSIS — F419 Anxiety disorder, unspecified: Secondary | ICD-10-CM | POA: Diagnosis not present

## 2021-02-22 DIAGNOSIS — O99214 Obesity complicating childbirth: Secondary | ICD-10-CM | POA: Diagnosis not present

## 2021-02-22 DIAGNOSIS — O112 Pre-existing hypertension with pre-eclampsia, second trimester: Secondary | ICD-10-CM | POA: Diagnosis not present

## 2021-02-22 DIAGNOSIS — R0981 Nasal congestion: Secondary | ICD-10-CM | POA: Diagnosis not present

## 2021-02-22 DIAGNOSIS — O1414 Severe pre-eclampsia complicating childbirth: Secondary | ICD-10-CM | POA: Diagnosis not present

## 2021-02-22 DIAGNOSIS — H35139 Retinopathy of prematurity, stage 2, unspecified eye: Secondary | ICD-10-CM | POA: Diagnosis not present

## 2021-02-22 DIAGNOSIS — O365930 Maternal care for other known or suspected poor fetal growth, third trimester, not applicable or unspecified: Secondary | ICD-10-CM | POA: Diagnosis not present

## 2021-02-22 DIAGNOSIS — R21 Rash and other nonspecific skin eruption: Secondary | ICD-10-CM | POA: Diagnosis not present

## 2021-02-22 DIAGNOSIS — O1413 Severe pre-eclampsia, third trimester: Secondary | ICD-10-CM | POA: Diagnosis not present

## 2021-02-22 DIAGNOSIS — Z051 Observation and evaluation of newborn for suspected infectious condition ruled out: Secondary | ICD-10-CM | POA: Diagnosis not present

## 2021-02-22 DIAGNOSIS — R1312 Dysphagia, oropharyngeal phase: Secondary | ICD-10-CM | POA: Diagnosis not present

## 2021-02-22 DIAGNOSIS — Z1389 Encounter for screening for other disorder: Secondary | ICD-10-CM | POA: Diagnosis not present

## 2021-02-22 DIAGNOSIS — O133 Gestational [pregnancy-induced] hypertension without significant proteinuria, third trimester: Secondary | ICD-10-CM

## 2021-02-22 DIAGNOSIS — O1493 Unspecified pre-eclampsia, third trimester: Secondary | ICD-10-CM | POA: Diagnosis not present

## 2021-02-22 DIAGNOSIS — J019 Acute sinusitis, unspecified: Secondary | ICD-10-CM | POA: Diagnosis not present

## 2021-02-22 DIAGNOSIS — O43812 Placental infarction, second trimester: Secondary | ICD-10-CM | POA: Diagnosis not present

## 2021-02-22 DIAGNOSIS — O321XX0 Maternal care for breech presentation, not applicable or unspecified: Secondary | ICD-10-CM | POA: Diagnosis not present

## 2021-02-22 DIAGNOSIS — J3489 Other specified disorders of nose and nasal sinuses: Secondary | ICD-10-CM | POA: Diagnosis not present

## 2021-02-22 DIAGNOSIS — B9689 Other specified bacterial agents as the cause of diseases classified elsewhere: Secondary | ICD-10-CM | POA: Diagnosis not present

## 2021-02-22 LAB — CBC
HCT: 36.1 % (ref 36.0–46.0)
Hemoglobin: 12.5 g/dL (ref 12.0–15.0)
MCH: 31.1 pg (ref 26.0–34.0)
MCHC: 34.6 g/dL (ref 30.0–36.0)
MCV: 89.8 fL (ref 80.0–100.0)
Platelets: 188 10*3/uL (ref 150–400)
RBC: 4.02 MIL/uL (ref 3.87–5.11)
RDW: 13.3 % (ref 11.5–15.5)
WBC: 10.5 10*3/uL (ref 4.0–10.5)
nRBC: 0 % (ref 0.0–0.2)

## 2021-02-22 LAB — COMPREHENSIVE METABOLIC PANEL
ALT: 14 U/L (ref 0–44)
AST: 18 U/L (ref 15–41)
Albumin: 2.8 g/dL — ABNORMAL LOW (ref 3.5–5.0)
Alkaline Phosphatase: 62 U/L (ref 38–126)
Anion gap: 7 (ref 5–15)
BUN: 5 mg/dL — ABNORMAL LOW (ref 6–20)
CO2: 23 mmol/L (ref 22–32)
Calcium: 9.1 mg/dL (ref 8.9–10.3)
Chloride: 105 mmol/L (ref 98–111)
Creatinine, Ser: 0.7 mg/dL (ref 0.44–1.00)
GFR, Estimated: >60 mL/min (ref >60)
Glucose, Bld: 80 mg/dL (ref 70–99)
Potassium: 4.3 mmol/L (ref 3.5–5.1)
Sodium: 135 mmol/L (ref 135–145)
Total Bilirubin: 0.4 mg/dL (ref 0.3–1.2)
Total Protein: 6 g/dL — ABNORMAL LOW (ref 6.5–8.1)

## 2021-02-22 LAB — PROTEIN / CREATININE RATIO, URINE
Creatinine, Urine: 332.3 mg/dL
Protein Creatinine Ratio: 0.08 mg/mg{Cre} (ref 0.00–0.15)
Total Protein, Urine: 28 mg/dL

## 2021-02-22 NOTE — MAU Provider Note (Signed)
History     CSN: [id number]  Arrival date and time: 02/22/21 1526   Event Date/Time   First Provider Initiated Contact with Patient 02/22/21 1635       Chief Complaint  Patient presents with   Hypertension   swollen hands   HPI This is a 30yo G2P0010 at [redacted]w[redacted]d with history of anxiety. She was seen in the office last week, started on labetalol 100mg  bid by her OB. She was seen for BP check today and had BP of 150/110. Denies headache, blurred vision, abdominal pain. Good fetal movement.   OB History     Gravida  2   Para      Term      Preterm      AB  1   Living         SAB  1   IAB      Ectopic      Multiple      Live Births              Past Medical History:  Diagnosis Date   Anxiety    Asthma    Depression    Elevated blood pressure reading    Frequent headaches    Recurrent UTI     Past Surgical History:  Procedure Laterality Date   DILATION AND EVACUATION N/A 02/11/2020   Procedure: DILATATION AND EVACUATION;  Surgeon: 04/12/2020, DO;  Location: [facility] OR;  Service: Gynecology;  Laterality: N/A;    Family History  Problem Relation Age of Onset   Hypertension Father    Hypertension Brother     Social History   Tobacco Use   Smoking status: Never   Smokeless tobacco: Never  Vaping Use   Vaping Use: Never used  Substance Use Topics   Alcohol use: Yes    Alcohol/week: 2.0 - 3.0 standard drinks    Types: 2 - 3 Standard drinks or equivalent per week    Comment: occassionally   Drug use: No    Allergies:  Allergies  Allergen Reactions   Prednisone Other (See Comments)    Pt states "It makes me feel weird.    Medications Prior to Admission  Medication Sig Dispense Refill Last Dose   aspirin EC 81 MG tablet Take 81 mg by mouth daily. Swallow whole.   02/21/2021   Magnesium 100 MG CAPS Take by mouth.   02/21/2021   Prenatal MV & Min w/FA-DHA (PRENATAL ADULT GUMMY/DHA/FA PO) Take 2 tablets by mouth at bedtime. Gummies   02/21/2021    sertraline (ZOLOFT) 100 MG tablet Take 1 tablet (100 mg total) by mouth at bedtime. 90 tablet 3 02/21/2021   sertraline (ZOLOFT) 100 MG tablet Take 100 mg by mouth daily.      Turmeric (QC TUMERIC COMPLEX PO) Take by mouth.      UNABLE TO FIND Med Name: Vitamin C gummies      Zinc 50 MG CAPS Take by mouth.       Review of Systems Physical Exam   Blood pressure (!) 152/93, pulse (!) 52, temperature 98.5 F (36.9 C), temperature source Oral, resp. rate 18, height 5\' 7"  (1.702 m), weight 107.1 kg, SpO2 98 %.  Patient Vitals for the past 24 hrs:  BP Temp Temp src Pulse Resp SpO2 Height Weight  02/22/21 1810 -- -- -- -- -- 98 % -- --  02/22/21 1805 -- -- -- -- -- 96 % -- --  02/22/21 1801 (!) 152/93 -- -- 02/24/21  52 -- -- -- --  02/22/21 1800 -- -- -- -- -- 98 % -- --  02/22/21 1755 -- -- -- -- -- 97 % -- --  02/22/21 1750 -- -- -- -- -- 97 % -- --  02/22/21 1745 (!) 142/96 -- -- 61 -- 97 % -- --  02/22/21 1740 -- -- -- -- -- 98 % -- --  02/22/21 1735 -- -- -- -- -- 97 % -- --  02/22/21 1730 (!) 140/96 -- -- 63 -- 98 % -- --  02/22/21 1725 -- -- -- -- -- 97 % -- --  02/22/21 1720 -- -- -- -- -- 96 % -- --  02/22/21 1716 (!) 139/93 -- -- 61 -- -- -- --  02/22/21 1715 -- -- -- -- -- 95 % -- --  02/22/21 1705 -- -- -- -- -- 95 % -- --  02/22/21 1700 (!) 145/92 -- -- 63 -- 97 % -- --  02/22/21 1655 -- -- -- -- -- 96 % -- --  02/22/21 1650 -- -- -- -- -- 96 % -- --  02/22/21 1646 (!) 141/88 -- -- 64 -- -- -- --  02/22/21 1645 -- -- -- -- -- 96 % -- --  02/22/21 1640 -- -- -- -- -- 97 % -- --  02/22/21 1635 -- -- -- -- -- 98 % -- --  02/22/21 1630 (!) 148/96 -- -- (!) 59 -- 98 % -- --  02/22/21 1625 -- -- -- -- -- 97 % -- --  02/22/21 1620 -- -- -- -- -- 95 % -- --  02/22/21 1615 (!) 150/96 -- -- (!) 59 -- 97 % -- --  02/22/21 1605 (!) 143/88 -- -- (!) 59 -- 98 % -- --  02/22/21 1559 139/89 -- -- 61 -- 97 % -- --  02/22/21 1542 (!) 149/104 98.5 F (36.9 C) Oral 71 18 99 % 5\' 7"  (1.702  m) 107.1 kg     Physical Exam Vitals reviewed.  Constitutional:      Appearance: Normal appearance.  Cardiovascular:     Rate and Rhythm: Normal rate and regular rhythm.  Pulmonary:     Effort: Pulmonary effort is normal.     Breath sounds: Normal breath sounds.  Abdominal:     General: Abdomen is flat. There is no distension.     Palpations: There is no mass.     Tenderness: There is no abdominal tenderness.     Hernia: No hernia is present.  Skin:    General: Skin is warm and dry.     Capillary Refill: Capillary refill takes less than 2 seconds.  Neurological:     General: No focal deficit present.     Mental Status: She is alert.  Psychiatric:        Mood and Affect: Mood normal.        Behavior: Behavior normal.        Thought Content: Thought content normal.   Results for orders placed or performed during the hospital encounter of 02/22/21 (from the past 24 hour(s))  Comprehensive metabolic panel     Status: Abnormal   Collection Time: 02/22/21  4:07 PM  Result Value Ref Range   Sodium 135 135 - 145 mmol/L   Potassium 4.3 3.5 - 5.1 mmol/L   Chloride 105 98 - 111 mmol/L   CO2 23 22 - 32 mmol/L   Glucose, Bld 80 70 - 99 mg/dL   BUN 5 (L) 6 - 20  mg/dL   Creatinine, Ser 2.53 0.44 - 1.00 mg/dL   Calcium 9.1 8.9 - 66.4 mg/dL   Total Protein 6.0 (L) 6.5 - 8.1 g/dL   Albumin 2.8 (L) 3.5 - 5.0 g/dL   AST 18 15 - 41 U/L   ALT 14 0 - 44 U/L   Alkaline Phosphatase 62 38 - 126 U/L   Total Bilirubin 0.4 0.3 - 1.2 mg/dL   GFR, Estimated >40 >34 mL/min   Anion gap 7 5 - 15  CBC     Status: None   Collection Time: 02/22/21  4:07 PM  Result Value Ref Range   WBC 10.5 4.0 - 10.5 K/uL   RBC 4.02 3.87 - 5.11 MIL/uL   Hemoglobin 12.5 12.0 - 15.0 g/dL   HCT 74.2 59.5 - 63.8 %   MCV 89.8 80.0 - 100.0 fL   MCH 31.1 26.0 - 34.0 pg   MCHC 34.6 30.0 - 36.0 g/dL   RDW 75.6 43.3 - 29.5 %   Platelets 188 150 - 400 K/uL   nRBC 0.0 0.0 - 0.2 %  Protein / creatinine ratio, urine      Status: None   Collection Time: 02/22/21  4:14 PM  Result Value Ref Range   Creatinine, Urine 332.30 mg/dL   Total Protein, Urine 28 mg/dL   Protein Creatinine Ratio 0.08 0.00 - 0.15 mg/mg[Cre]     MAU Course  Procedures NST: Baseline 150, moderate variability, no accels, no decels   MDM BPs have stayed in the 140-150 range.   Assessment and Plan   1. [redacted] weeks gestation of pregnancy   2. Gestational hypertension, second trimester    Discharge to home. Continue labetalol. Has follow up in office on Wednesday. Severe symptoms discussed.   Levie Heritage 02/22/2021, 6:21 PM

## 2021-02-22 NOTE — MAU Note (Signed)
Went to dr for regular BP check, has been elevated the last couple wks. Was 150/110, +protein in her urine today.  So dr sent in for further eval. Denies HA, visual changes, epigastric pain,  hands are swollen, ? Feet.

## 2021-02-24 ENCOUNTER — Inpatient Hospital Stay (HOSPITAL_COMMUNITY)
Admission: AD | Admit: 2021-02-24 | Discharge: 2021-03-05 | DRG: 788 | Disposition: A | Discharge status: Home / Self Care | Payer: BC Managed Care – PPO | Attending: Obstetrics & Gynecology | Admitting: Obstetrics & Gynecology

## 2021-02-24 ENCOUNTER — Encounter (HOSPITAL_COMMUNITY): Payer: Self-pay | Admitting: Obstetrics and Gynecology

## 2021-02-24 ENCOUNTER — Other Ambulatory Visit: Payer: Self-pay

## 2021-02-24 DIAGNOSIS — Z23 Encounter for immunization: Secondary | ICD-10-CM | POA: Diagnosis not present

## 2021-02-24 DIAGNOSIS — R001 Bradycardia, unspecified: Secondary | ICD-10-CM | POA: Diagnosis not present

## 2021-02-24 DIAGNOSIS — Z20822 Contact with and (suspected) exposure to covid-19: Secondary | ICD-10-CM | POA: Diagnosis present

## 2021-02-24 DIAGNOSIS — O1413 Severe pre-eclampsia, third trimester: Secondary | ICD-10-CM | POA: Diagnosis not present

## 2021-02-24 DIAGNOSIS — O365990 Maternal care for other known or suspected poor fetal growth, unspecified trimester, not applicable or unspecified: Secondary | ICD-10-CM | POA: Diagnosis present

## 2021-02-24 DIAGNOSIS — O365930 Maternal care for other known or suspected poor fetal growth, third trimester, not applicable or unspecified: Secondary | ICD-10-CM | POA: Diagnosis present

## 2021-02-24 DIAGNOSIS — R21 Rash and other nonspecific skin eruption: Secondary | ICD-10-CM | POA: Diagnosis not present

## 2021-02-24 DIAGNOSIS — O365920 Maternal care for other known or suspected poor fetal growth, second trimester, not applicable or unspecified: Secondary | ICD-10-CM | POA: Diagnosis not present

## 2021-02-24 DIAGNOSIS — O10012 Pre-existing essential hypertension complicating pregnancy, second trimester: Secondary | ICD-10-CM | POA: Diagnosis not present

## 2021-02-24 DIAGNOSIS — O1410 Severe pre-eclampsia, unspecified trimester: Secondary | ICD-10-CM | POA: Diagnosis present

## 2021-02-24 DIAGNOSIS — Z3A27 27 weeks gestation of pregnancy: Secondary | ICD-10-CM | POA: Diagnosis not present

## 2021-02-24 DIAGNOSIS — O169 Unspecified maternal hypertension, unspecified trimester: Secondary | ICD-10-CM | POA: Diagnosis not present

## 2021-02-24 DIAGNOSIS — O1414 Severe pre-eclampsia complicating childbirth: Principal | ICD-10-CM | POA: Diagnosis present

## 2021-02-24 DIAGNOSIS — O321XX0 Maternal care for breech presentation, not applicable or unspecified: Secondary | ICD-10-CM | POA: Diagnosis present

## 2021-02-24 DIAGNOSIS — O99892 Other specified diseases and conditions complicating childbirth: Secondary | ICD-10-CM | POA: Diagnosis not present

## 2021-02-24 DIAGNOSIS — F419 Anxiety disorder, unspecified: Secondary | ICD-10-CM | POA: Diagnosis present

## 2021-02-24 DIAGNOSIS — O99344 Other mental disorders complicating childbirth: Secondary | ICD-10-CM | POA: Diagnosis present

## 2021-02-24 DIAGNOSIS — Z888 Allergy status to other drugs, medicaments and biological substances status: Secondary | ICD-10-CM | POA: Diagnosis not present

## 2021-02-24 DIAGNOSIS — O1490 Unspecified pre-eclampsia, unspecified trimester: Secondary | ICD-10-CM

## 2021-02-24 DIAGNOSIS — O99214 Obesity complicating childbirth: Secondary | ICD-10-CM | POA: Diagnosis present

## 2021-02-24 DIAGNOSIS — O1412 Severe pre-eclampsia, second trimester: Secondary | ICD-10-CM | POA: Diagnosis not present

## 2021-02-24 DIAGNOSIS — R030 Elevated blood-pressure reading, without diagnosis of hypertension: Secondary | ICD-10-CM | POA: Diagnosis present

## 2021-02-24 LAB — CBC
HCT: 35.1 % — ABNORMAL LOW (ref 36.0–46.0)
Hemoglobin: 12.1 g/dL (ref 12.0–15.0)
MCH: 31.1 pg (ref 26.0–34.0)
MCHC: 34.5 g/dL (ref 30.0–36.0)
MCV: 90.2 fL (ref 80.0–100.0)
Platelets: 179 10*3/uL (ref 150–400)
RBC: 3.89 MIL/uL (ref 3.87–5.11)
RDW: 13.2 % (ref 11.5–15.5)
WBC: 8.4 10*3/uL (ref 4.0–10.5)
nRBC: 0 % (ref 0.0–0.2)

## 2021-02-24 LAB — COMPREHENSIVE METABOLIC PANEL
ALT: 13 U/L (ref 0–44)
AST: 17 U/L (ref 15–41)
Albumin: 2.7 g/dL — ABNORMAL LOW (ref 3.5–5.0)
Alkaline Phosphatase: 63 U/L (ref 38–126)
Anion gap: 7 (ref 5–15)
BUN: 6 mg/dL (ref 6–20)
CO2: 21 mmol/L — ABNORMAL LOW (ref 22–32)
Calcium: 9 mg/dL (ref 8.9–10.3)
Chloride: 106 mmol/L (ref 98–111)
Creatinine, Ser: 0.67 mg/dL (ref 0.44–1.00)
GFR, Estimated: >60 mL/min (ref >60)
Glucose, Bld: 86 mg/dL (ref 70–99)
Potassium: 3.9 mmol/L (ref 3.5–5.1)
Sodium: 134 mmol/L — ABNORMAL LOW (ref 135–145)
Total Bilirubin: 0.4 mg/dL (ref 0.3–1.2)
Total Protein: 5.8 g/dL — ABNORMAL LOW (ref 6.5–8.1)

## 2021-02-24 LAB — SARS CORONAVIRUS 2 (TAT 6-24 HRS): SARS Coronavirus 2: NEGATIVE

## 2021-02-24 LAB — TYPE AND SCREEN
ABO/RH(D): O POS
Antibody Screen: NEGATIVE

## 2021-02-24 LAB — PROTEIN / CREATININE RATIO, URINE
Creatinine, Urine: 259.77 mg/dL
Protein Creatinine Ratio: 0.12 mg/mg{Cre} (ref 0.00–0.15)
Total Protein, Urine: 31 mg/dL

## 2021-02-24 LAB — URIC ACID: Uric Acid, Serum: 4.5 mg/dL (ref 2.5–7.1)

## 2021-02-24 MED ORDER — BETAMETHASONE SOD PHOS & ACET 6 (3-3) MG/ML IJ SUSP
12.0000 mg | INTRAMUSCULAR | Status: AC
  Administered 2021-02-24 – 2021-02-25 (×2): 12 mg via INTRAMUSCULAR
  Filled 2021-02-24: qty 5

## 2021-02-24 MED ORDER — LACTATED RINGERS IV SOLN: INTRAVENOUS | Status: DC

## 2021-02-24 MED ORDER — PRENATAL MULTIVITAMIN CH
1.0000 | ORAL_TABLET | Freq: Every day | ORAL | Status: DC
  Administered 2021-02-25 – 2021-02-28 (×4): 1 via ORAL
  Filled 2021-02-24 (×4): qty 1

## 2021-02-24 MED ORDER — ACETAMINOPHEN 325 MG PO TABS
650.0000 mg | ORAL_TABLET | ORAL | Status: DC | PRN
  Administered 2021-02-24 – 2021-03-01 (×3): 650 mg via ORAL
  Filled 2021-02-24 (×4): qty 2

## 2021-02-24 MED ORDER — MAGNESIUM SULFATE 40 GM/1000ML IV SOLN
2.0000 g/h | INTRAVENOUS | Status: DC
  Administered 2021-02-25: 2 g/h via INTRAVENOUS
  Filled 2021-02-24 (×2): qty 1000

## 2021-02-24 MED ORDER — MAGNESIUM SULFATE BOLUS VIA INFUSION
4.0000 g | Freq: Once | INTRAVENOUS | Status: AC
  Administered 2021-02-24: 4 g via INTRAVENOUS
  Filled 2021-02-24: qty 1000

## 2021-02-24 MED ORDER — ZOLPIDEM TARTRATE 5 MG PO TABS: 5.0000 mg | ORAL_TABLET | Freq: Every evening | ORAL | Status: DC | PRN

## 2021-02-24 MED ORDER — DOCUSATE SODIUM 100 MG PO CAPS
100.0000 mg | ORAL_CAPSULE | Freq: Every day | ORAL | Status: DC
  Administered 2021-02-25 – 2021-03-05 (×9): 100 mg via ORAL
  Filled 2021-02-24 (×8): qty 1

## 2021-02-24 MED ORDER — LABETALOL HCL 5 MG/ML IV SOLN
20.0000 mg | INTRAVENOUS | Status: DC | PRN
  Administered 2021-02-27: 20 mg via INTRAVENOUS
  Filled 2021-02-24: qty 4

## 2021-02-24 MED ORDER — HYDRALAZINE HCL 20 MG/ML IJ SOLN
10.0000 mg | INTRAMUSCULAR | Status: DC | PRN
  Administered 2021-02-27 – 2021-03-01 (×2): 10 mg via INTRAVENOUS
  Filled 2021-02-24 (×2): qty 1

## 2021-02-24 MED ORDER — LABETALOL HCL 5 MG/ML IV SOLN
INTRAVENOUS | Status: AC
  Administered 2021-02-24: 20 mg
  Filled 2021-02-24: qty 4

## 2021-02-24 MED ORDER — LABETALOL HCL 200 MG PO TABS: 200.0000 mg | ORAL_TABLET | Freq: Two times a day (BID) | ORAL | Status: DC

## 2021-02-24 MED ORDER — LABETALOL HCL 5 MG/ML IV SOLN: 80.0000 mg | INTRAVENOUS | Status: DC | PRN

## 2021-02-24 MED ORDER — SERTRALINE HCL 50 MG PO TABS
100.0000 mg | ORAL_TABLET | Freq: Every day | ORAL | Status: DC
  Administered 2021-02-24 – 2021-02-28 (×5): 100 mg via ORAL
  Filled 2021-02-24 (×5): qty 2

## 2021-02-24 MED ORDER — CALCIUM CARBONATE ANTACID 500 MG PO CHEW: 2.0000 | CHEWABLE_TABLET | ORAL | Status: DC | PRN

## 2021-02-24 MED ORDER — LABETALOL HCL 5 MG/ML IV SOLN
40.0000 mg | INTRAVENOUS | Status: DC | PRN
  Administered 2021-02-24: 40 mg via INTRAVENOUS
  Filled 2021-02-24: qty 8

## 2021-02-24 NOTE — H&P (Signed)
Katherine [name] is a  30 year old G 1 P 0 at 22 w 3 days presents from office  BP has been elevated since 25 weeks was 150s over 100s on Monday with 3 + proteinuria  Ultrasound today - suspicious for IUGR 4.8 % EFW with low amniotic fluid  Has noticed progressive swelling OB History     Gravida  2   Para      Term      Preterm      AB  1   Living         SAB  1   IAB      Ectopic      Multiple      Live Births             Past Medical History:  Diagnosis Date   Anxiety    Asthma    Depression    Elevated blood pressure reading    Frequent headaches    Recurrent UTI    Past Surgical History:  Procedure Laterality Date   DILATION AND EVACUATION N/A 02/11/2020   Procedure: DILATATION AND EVACUATION;  Surgeon: Mitchel Honour, DO;  Location: [facility] OR;  Service: Gynecology;  Laterality: N/A;   Family History: family history includes Hypertension in her brother and father. Social History:  reports that she has never smoked. She has never used smokeless tobacco. She reports current alcohol use of about 2.0 - 3.0 standard drinks per week. She reports that she does not use drugs.     Maternal Diabetes: No Genetic Screening: Normal Maternal Ultrasounds/Referrals: IUGR Fetal Ultrasounds or other Referrals:  Referred to Materal Fetal Medicine  Maternal Substance Abuse:  No Significant Maternal Medications:  None Significant Maternal Lab Results:  None Other Comments:  None  Review of Systems  All other systems reviewed and are negative. Maternal Medical History:  Prenatal complications: PIH and IUGR.      There were no vitals taken for this visit. Exam Physical Exam Vitals and nursing note reviewed. Exam conducted with a chaperone present.  HENT:     Mouth/Throat:     Mouth: Mucous membranes are moist.  Eyes:     Pupils: Pupils are equal, round, and reactive to light.  Cardiovascular:     Rate and Rhythm: Normal rate and regular rhythm.  Pulmonary:      Effort: Pulmonary effort is normal.  Neurological:     Mental Status: She is alert.    Prenatal labs: ABO, Rh:   Antibody:   Rubella:   RPR:    HBsAg:    HIV:    GBS:     Assessment/Plan: IUP at 27 w 3 days Hypertension - possible chronic hypertension with superimposed PIH  IUGR  Admit To [facility] MFM consult today to further evaluate fetal status  PIH labs  Monitor BP Steroid series  NICU Consultation  Jeani Hawking 02/24/2021, 11:58 AM

## 2021-02-24 NOTE — Progress Notes (Signed)
Called because BPs 170s / 110s BP (!) 176/97   Pulse (!) 50   Temp 98.2 F (36.8 C) (Oral)   Resp 20   Ht 5\' 7"  (1.702 m)   Wt 107.1 kg   SpO2 99%   BMI 36.98 kg/m   Results for orders placed or performed during the hospital encounter of 02/24/21 (from the past 24 hour(s))  Protein / creatinine ratio, urine     Status: None   Collection Time: 02/24/21 12:34 PM  Result Value Ref Range   Creatinine, Urine 259.77 mg/dL   Total Protein, Urine 31 mg/dL   Protein Creatinine Ratio 0.12 0.00 - 0.15 mg/mg[Cre]  CBC on admission     Status: Abnormal   Collection Time: 02/24/21  1:37 PM  Result Value Ref Range   WBC 8.4 4.0 - 10.5 K/uL   RBC 3.89 3.87 - 5.11 MIL/uL   Hemoglobin 12.1 12.0 - 15.0 g/dL   HCT 02/26/21 (L) 76.7 - 34.1 %   MCV 90.2 80.0 - 100.0 fL   MCH 31.1 26.0 - 34.0 pg   MCHC 34.5 30.0 - 36.0 g/dL   RDW 93.7 90.2 - 40.9 %   Platelets 179 150 - 400 K/uL   nRBC 0.0 0.0 - 0.2 %  Type and screen [facility]     Status: None   Collection Time: 02/24/21  1:37 PM  Result Value Ref Range   ABO/RH(D) O POS    Antibody Screen NEG    Sample Expiration      02/27/2021,2359 Performed at [facility], [address], [place], [place]   Comprehensive metabolic panel     Status: Abnormal   Collection Time: 02/24/21  1:37 PM  Result Value Ref Range   Sodium 134 (L) 135 - 145 mmol/L   Potassium 3.9 3.5 - 5.1 mmol/L   Chloride 106 98 - 111 mmol/L   CO2 21 (L) 22 - 32 mmol/L   Glucose, Bld 86 70 - 99 mg/dL   BUN 6 6 - 20 mg/dL   Creatinine, Ser 02/26/21 0.44 - 1.00 mg/dL   Calcium 9.0 8.9 - 4.26 mg/dL   Total Protein 5.8 (L) 6.5 - 8.1 g/dL   Albumin 2.7 (L) 3.5 - 5.0 g/dL   AST 17 15 - 41 U/L   ALT 13 0 - 44 U/L   Alkaline Phosphatase 63 38 - 126 U/L   Total Bilirubin 0.4 0.3 - 1.2 mg/dL   GFR, Estimated 83.4 >19 mL/min   Anion gap 7 5 - 15  Uric acid     Status: None   Collection Time: 02/24/21  1:37 PM  Result Value Ref Range   Uric Acid, Serum  4.5 2.5 - 7.1 mg/dL   Mfm consult pending Will initiate hypertension protocol and start Magnesium for seizure prophylaxis  MFM consult requested and waiting on that

## 2021-02-24 NOTE — Plan of Care (Signed)
  Problem: Education: Goal: Knowledge of General Education information will improve Description: Including pain rating scale, medication(s)/side effects and non-pharmacologic comfort measures Outcome: Completed/Met

## 2021-02-25 ENCOUNTER — Encounter: Payer: Self-pay | Admitting: Family Medicine

## 2021-02-25 ENCOUNTER — Inpatient Hospital Stay (HOSPITAL_BASED_OUTPATIENT_CLINIC_OR_DEPARTMENT_OTHER): Payer: BC Managed Care – PPO

## 2021-02-25 DIAGNOSIS — O10012 Pre-existing essential hypertension complicating pregnancy, second trimester: Secondary | ICD-10-CM

## 2021-02-25 DIAGNOSIS — O365920 Maternal care for other known or suspected poor fetal growth, second trimester, not applicable or unspecified: Secondary | ICD-10-CM | POA: Diagnosis not present

## 2021-02-25 DIAGNOSIS — O1412 Severe pre-eclampsia, second trimester: Secondary | ICD-10-CM

## 2021-02-25 DIAGNOSIS — Z3A27 27 weeks gestation of pregnancy: Secondary | ICD-10-CM | POA: Diagnosis not present

## 2021-02-25 DIAGNOSIS — O1413 Severe pre-eclampsia, third trimester: Secondary | ICD-10-CM | POA: Diagnosis not present

## 2021-02-25 MED ORDER — NIFEDIPINE ER OSMOTIC RELEASE 30 MG PO TB24
30.0000 mg | ORAL_TABLET | Freq: Every day | ORAL | Status: DC
  Administered 2021-02-25 – 2021-02-26 (×2): 30 mg via ORAL
  Filled 2021-02-25 (×2): qty 1

## 2021-02-25 NOTE — Consult Note (Signed)
MFM Note  Katherine [name] is a 30 year old gravida 2 para 0 currently at 27 weeks and 4 days.  She was admitted yesterday due to concerns regarding preeclampsia as she had elevated blood pressures in the 150s over 100s range with 3+ proteinuria noted on urinalysis.  The patient's blood pressures were noted to be elevated starting at around 25 weeks.  She was started on labetalol 100 mg twice a day about 1 week ago for blood pressure control.    A recent ultrasound performed in your office showed IUGR with an EFW that measured in the 5th percentile for her gestational age along with low normal amniotic fluid.  The patient denies any history of hypertension.    Since being admitted to the hospital, she did have one blood pressure reading of 170/110.  Due to concerns regarding severe preeclampsia, she was started on magnesium sulfate for maternal seizure prophylaxis and given a course of antenatal corticosteroids.  Her PIH labs on admission were all within normal limits, other than hemoconcentration.  Her P/C ratio was 0.12.  Her blood pressures today have mostly been in the 140s to 150s over 80s to 90s range.  She remains asymptomatic.  She had an ultrasound performed this morning that showed an EFW of 2 pounds (6 percentile) indicating IUGR.  Low normal amniotic fluid with a total AFI of 7.75 cm was noted.  The fetus was in the vertex presentation.  The views of the fetal anatomy were limited today due to her advanced gestational age and maternal body habitus.  Doppler studies of the umbilical arteries performed today showed a normal S/D ratio of 3.69.  There were no signs of absent or reversed end-diastolic flow.  The patient reports that her cell free DNA test did not indicate any results due to a low fetal fraction.  Her past medical history includes anxiety/depression, asthma, and recurrent UTIs.  Her past surgical history includes a D&E.  The implications and management of preeclampsia  was discussed with the patient.  She was advised that preeclampsia can affect both the mother and the fetus.  In the mother, preeclampsia may cause a rise in blood pressures and it can affect the mother's kidney, liver and platelet functions.  It may also cause the mother to have seizures.  In the fetus, it may cause growth restriction and oligohydramnios.  She understands that delivery is the only treatment for preeclampsia.  The patient was advised that the diagnosis of preeclampsia remains uncertain as her P/C ratio did not indicate significant proteinuria.  However, she did have significant proteinuria based on the 3+ protein on her urinalysis.   As the diagnosis of preeclampsia remains uncertain, we will collect a 24-hour urine today to determine if she meets the criteria for preeclampsia.  Should her 24-hour urine show 300 mg or more of protein, confirming the diagnosis of preeclampsia, continued inpatient management is recommended until delivery.   If preeclampsia is confirmed, she should continue daily fetal testing in the hospital and have twice weekly PIH labs drawn.  The goal for her delivery is 34 weeks should her blood pressures and her labs remain stable.  She should have weekly BPP's, umbilical artery Doppler studies, and amniotic fluid checks performed.  Labetalol or procardia may be continued for blood pressure control to enable her to reach a more optimal gestational age for delivery.  Delivery prior to 34 weeks would be indicated should she complain of any signs or symptoms of preeclampsia, should her blood pressures  be persistently elevated above the 160/100's range despite medication treatment, should she require IV push antihypertensive medications to control her blood pressures, should there be nonreassuring fetal status, or should her preeclampsia labs show any abnormalities.    She should receive a rescue course of steroids should she require delivery before 34 weeks and it has  been 7 or more days since her initial course.  She should receive magnesium sulfate for fetal neuro protection should she require delivery at 32 weeks or less.  Should her 24-hour urine show less than 300 mg of protein, indicating that she does not have preeclampsia but has gestational hypertension with fetal growth restriction, outpatient management with twice-weekly fetal testing may be considered as long as her blood pressures are under control with medication treatment.  Continued inpatient management may be necessary should her blood pressures remain elevated.  She should be referred to our office for weekly ultrasounds.   The patient understands that should preeclampsia be ruled out, there may still be a possibility that she may develop preeclampsia later in her pregnancy and may require an indicated preterm birth.   The patient stated that all of her questions have been answered to her complete satisfaction.     Thank you for referring this patient for a Maternal-Fetal Medicine consultation.  Recommendations:  24-hour urine collection Further management will be determined based on the 24-hour urine results Continue labetalol or procardia for blood pressure control

## 2021-02-25 NOTE — Consult Note (Signed)
[facility]  Prenatal Consult       02/25/2021  8:20 PM  I was asked by Dr. Marcelle Overlie to consult on this patient for possible preterm delivery. I had the pleasure of meeting with MARAI TEEHAN and her husband today.   She is a 30 yo G98P0010 female presenting at [redacted]w[redacted]d IUP with concerns for preeclampsia. Pregnancy has also been complicated by IUGR (6%). She has received BMZ x2 (last dose 8/25 1400). Most recent estimated fetal weight of 911 grams today.  They have chosen to name their [name].   We discussed the possible needs for an infant born at this gestation if either mother or infant's status necessitated delivery. I explained that the neonatal intensive care team would be present for the delivery and outlined the likely delivery room course for this baby including routine resuscitation and NRP-guided approaches to the treatment of respiratory distress. We discussed the potential need for CPAP or mechanical ventilation and surfactant administration for respiratory distress, IV fluids pending establishment of enteral feeds (encouraged breast milk feeding to which she plans to do), antibiotics for possible sepsis, temperature support, and monitoring.   We discussed other common problems associated with prematurity including respiratory distress syndrome/CLD, feeding issues, temperature regulation, and infection risk. I also discussed the potential risk of complications such as intracranial hemorrhage, retinopathy, chronic lung disease, and a range of long term developmental delays, and how these risks decrease with each additional week of gestation.  We discussed the importance of good nutrition and various methods of providing nutrition (parenteral hyperalimentation, gavage feedings and/or oral feeding). We discussed the benefits of human milk. I encouraged breast feeding and pumping soon after birth and outlined resources that are available to support  breast feeding. We discussed the possibility of using donor breast milk as a bridge, and they expressed the desire to use DBM if needed to supplement MBM.  We discussed the average length of stay but I noted that the actual LOS would depend on the severity of problems encountered and response to treatments. We discussed visitation policies and the resources available while their child is in the hospital.  They expressed understanding and agreement with the plan for resuscitation and intensive care. All questions were answered.  Thank you for involving [place] in the care of this patient. A member of our team will be available should the family have additional questions.   Simone Curia, MD Neonatal Medicine  I spent ~50 minutes in consultation time, of which 35 minutes was spent in direct face to face counseling.

## 2021-02-25 NOTE — Progress Notes (Signed)
Antepartum Progress Note   Katherine [name] is a 30 y.o. female G2P0010 that is admitted to [facility] for preeclampsia with severe features (severe range BP), IUGR.  Overnight, she reports no acute events. She had brief headache upon starting magnesium that resolved with tylenol after 20 min. Deneis vision change, RUQ or epigastric pain.  Admits fetal movement, denies contractions, denies leakage of fluid, denies vaginal bleeding.  History   Blood pressure 139/85, pulse 72, temperature 97.8 F (36.6 C), temperature source Oral, resp. rate 17, height 5\' 7"  (1.702 m), weight 107.2 kg, SpO2 99 %. Exam  Physical Exam: Gen: Alert, well appearing, no distress Chest: nonlabored breathing CV: no peripheral edema Abdomen: gravid, soft and nontender Ext: no evidence of DVT Neuro: reflexes appropriate  FHT: moderate/10x10 accels/carrotstick decels. Appropriate for gestational age.  Assessment/Plan: Admitted to [facility] for preeclampsia with severe features, suspected growth restriction.  PIH labs WNL, P/C ratio WNL. EFM and toco continuous Magnesium started 8/24 17:39 for severe range BP Received labetalol protocol yesterday afternoon, BP improved overnight Consider PO antihypertensive BMZ 8/24, 8/25 MFM consulted NICU consulted for prematurity, growth restriction OB 9/25 ordered for UA dopplers, EFW/fluid, BPP Diet: liquid (on mag) DVT Ppx: SCDs All questions answered. Continue inpatient monitoring for severe preeclampsia with suspected growth restriction.   [place] 02/25/2021, 8:19 AM

## 2021-02-26 LAB — PROTEIN, URINE, 24 HOUR
Collection Interval-UPROT: 24 hours
Protein, 24H Urine: 704 mg/d — ABNORMAL HIGH (ref 50–100)
Protein, Urine: 88 mg/dL
Urine Total Volume-UPROT: 800 mL

## 2021-02-26 MED ORDER — DIPHENHYDRAMINE HCL 25 MG PO CAPS
25.0000 mg | ORAL_CAPSULE | Freq: Four times a day (QID) | ORAL | Status: DC | PRN
  Administered 2021-02-26: 25 mg via ORAL
  Filled 2021-02-26: qty 1

## 2021-02-26 NOTE — Progress Notes (Addendum)
Provider notified of pt complaining of chest tightness, bilateral arm weakness, and minimal rash on hands, under both eyes, and chest. Pt is alert, oriented x4, free of pain, and denies PIH symptoms. Rash is not itchy or painful. BP 156/92 and O2 98% on RA. Benadryl ordered. Pt educated on worsening symptoms and instructed to call if symptoms worsen. Will continue to monitor pt.

## 2021-02-26 NOTE — Progress Notes (Addendum)
Antepartum Progress Note   Patient denies HA, vision change, RUQ pain, CP/SOB.  Active FM.  Had episode of rash last night which improved with Benadryl.  Has known "allergy" to prednisone and is s/p BMZ.     History Vitals:   02/26/21 0814 02/26/21 1002  BP: (!) 146/89 132/86  Pulse: 61 65  Resp: 18   Temp: 98.1 F (36.7 C)   SpO2: 97%     Exam   Physical Exam: Gen: Alert, well appearing, no distress Chest: nonlabored breathing CV: no peripheral edema Abdomen: gravid, soft and nontender Ext: no evidence of DVT Neuro: reflexes brisk; no clonus   FHT: 150. Mod variability.  Accels and occasional variable. U/S yesterday with MFM: EFW 2# (6%), AFI 7.75 cm.  Vertex presentation.  Normal Dopplers.   Assessment/Plan: 29yo G2P0010 at [redacted]w[redacted]d with preeclampsia with severe features, IUGR  Patient continues to be asymptomatic.  PIH labs WNL, P/C ratio WNL. S/P MFM consult Collecting 24 hour urine (up around 330 pm today).  Will base inpatient vs outpatient management upon results.   EFM and toco continuous Continue Procardia 30 XL q day.  Increase dose and add labetalol prn. BMZ 8/24, 8/25 S/p NICU consult DVT Ppx: SCDs       Mitchel Honour, DO

## 2021-02-26 NOTE — Progress Notes (Signed)
24 hour urine returned at 704 mg which rules patient in for pre-eclampsia. Will keep inpatient for remainder of pregnancy.   -Daily fetal testing  -Twice weekly PIH labs  -Goal for delivery is 34 weeks  -Weekly BPPs with umbilical artery Doppler studies -Plan rescue course of steroids should she require delivery before 34 weeks and it has been 7 or more days since her initial course. -Magnesium sulfate for fetal neuro protection should she require delivery at 32 weeks or less. -Titrate anti-hypertensives prn  Patient is counseled re: this plan and expresses understanding  Mitchel Honour, DO

## 2021-02-26 NOTE — Progress Notes (Addendum)
Pt verbalized that symptoms have resolved but still remains with slight weakness in arms. Pt alert, oriented x4, free of, and no PIH symptoms present. Rash is minimally present. Will continue to monitor pt.

## 2021-02-27 ENCOUNTER — Inpatient Hospital Stay (HOSPITAL_BASED_OUTPATIENT_CLINIC_OR_DEPARTMENT_OTHER): Payer: BC Managed Care – PPO

## 2021-02-27 DIAGNOSIS — O1412 Severe pre-eclampsia, second trimester: Secondary | ICD-10-CM

## 2021-02-27 DIAGNOSIS — Z3A27 27 weeks gestation of pregnancy: Secondary | ICD-10-CM | POA: Diagnosis not present

## 2021-02-27 DIAGNOSIS — O365920 Maternal care for other known or suspected poor fetal growth, second trimester, not applicable or unspecified: Secondary | ICD-10-CM | POA: Diagnosis not present

## 2021-02-27 LAB — COMPREHENSIVE METABOLIC PANEL
ALT: 14 U/L (ref 0–44)
AST: 17 U/L (ref 15–41)
Albumin: 2.5 g/dL — ABNORMAL LOW (ref 3.5–5.0)
Alkaline Phosphatase: 59 U/L (ref 38–126)
Anion gap: 7 (ref 5–15)
BUN: 11 mg/dL (ref 6–20)
CO2: 23 mmol/L (ref 22–32)
Calcium: 8.6 mg/dL — ABNORMAL LOW (ref 8.9–10.3)
Chloride: 106 mmol/L (ref 98–111)
Creatinine, Ser: 0.61 mg/dL (ref 0.44–1.00)
GFR, Estimated: >60 mL/min (ref >60)
Glucose, Bld: 89 mg/dL (ref 70–99)
Potassium: 4.2 mmol/L (ref 3.5–5.1)
Sodium: 136 mmol/L (ref 135–145)
Total Bilirubin: 0.3 mg/dL (ref 0.3–1.2)
Total Protein: 5.9 g/dL — ABNORMAL LOW (ref 6.5–8.1)

## 2021-02-27 LAB — CBC
HCT: 34.4 % — ABNORMAL LOW (ref 36.0–46.0)
Hemoglobin: 11.6 g/dL — ABNORMAL LOW (ref 12.0–15.0)
MCH: 31.1 pg (ref 26.0–34.0)
MCHC: 33.7 g/dL (ref 30.0–36.0)
MCV: 92.2 fL (ref 80.0–100.0)
Platelets: 175 10*3/uL (ref 150–400)
RBC: 3.73 MIL/uL — ABNORMAL LOW (ref 3.87–5.11)
RDW: 13.6 % (ref 11.5–15.5)
WBC: 12.2 10*3/uL — ABNORMAL HIGH (ref 4.0–10.5)
nRBC: 0 % (ref 0.0–0.2)

## 2021-02-27 LAB — TYPE AND SCREEN
ABO/RH(D): O POS
Antibody Screen: NEGATIVE

## 2021-02-27 MED ORDER — HYDROXYZINE HCL 50 MG PO TABS
50.0000 mg | ORAL_TABLET | Freq: Three times a day (TID) | ORAL | Status: DC | PRN
  Administered 2021-02-27 – 2021-02-28 (×2): 50 mg via ORAL
  Filled 2021-02-27 (×2): qty 1

## 2021-02-27 MED ORDER — NIFEDIPINE ER OSMOTIC RELEASE 30 MG PO TB24
30.0000 mg | ORAL_TABLET | ORAL | Status: AC
  Administered 2021-02-27: 30 mg via ORAL

## 2021-02-27 MED ORDER — NIFEDIPINE ER OSMOTIC RELEASE 30 MG PO TB24
30.0000 mg | ORAL_TABLET | Freq: Two times a day (BID) | ORAL | Status: DC
  Administered 2021-02-27 – 2021-02-28 (×4): 30 mg via ORAL
  Filled 2021-02-27 (×5): qty 1

## 2021-02-27 NOTE — Progress Notes (Signed)
Antepartum Progress Note   Patient reports poor sleep last night.  She took Ambien 2 nights ago and slept well but did not take it last night.  She feels anxious this morning and is shaky.  Patient denies HA, vision change, RUQ pain, CP/SOB.  Active FM.     History  Vitals:   02/27/21 0637 02/27/21 0654  BP: (!) 161/98 (!) 161/97  Pulse: (!) 49 (!) 50  Resp:    Temp:    SpO2:     Upper mild range BPs overnight and Procardia frequency increased to BID.  This AM, severe range BP was noted and RN was called in request giving procardia dose due at 10 am early.  RN informed me that she had already initiated labetalol protocol; one dose of 20 mg IV given.  Early procardia also given.      CBC Latest Ref Rng & Units 02/27/2021 02/24/2021 02/22/2021  WBC 4.0 - 10.5 K/uL 12.2(H) 8.4 10.5  Hemoglobin 12.0 - 15.0 g/dL 11.6(L) 12.1 12.5  Hematocrit 36.0 - 46.0 % 34.4(L) 35.1(L) 36.1  Platelets 150 - 400 K/uL 175 179 188   CMP Latest Ref Rng & Units 02/27/2021 02/24/2021 02/22/2021  Glucose 70 - 99 mg/dL 89 86 80  BUN 6 - 20 mg/dL 11 6 5(L)  Creatinine 1.61 - 1.00 mg/dL 0.96 0.45 4.09  Sodium 135 - 145 mmol/L 136 134(L) 135  Potassium 3.5 - 5.1 mmol/L 4.2 3.9 4.3  Chloride 98 - 111 mmol/L 106 106 105  CO2 22 - 32 mmol/L 23 21(L) 23  Calcium 8.9 - 10.3 mg/dL 8.1(X) 9.0 9.1  Total Protein 6.5 - 8.1 g/dL 5.9(L) 5.8(L) 6.0(L)  Total Bilirubin 0.3 - 1.2 mg/dL 0.3 0.4 0.4  Alkaline Phos 38 - 126 U/L 59 63 62  AST 15 - 41 U/L 17 17 18   ALT 0 - 44 U/L 14 13 14     Exam   Physical Exam: Gen: Anxious appearing Chest: nonlabored breathing CV: no peripheral edema Abdomen: gravid, soft and nontender Ext: no evidence of DVT Neuro: reflexes brisk (unchanged); no clonus   FHT: Patient had episode of late decelerations around 1500 yesterday which resolved with repositioning.  She was kept on continuous overnight with no recurrent decelerations.  Order was placed this AM to discontinue continuous and do  NST q shift.  Unfortunately, just prior to taking patient off NST, patient had 2 more declerations about which I was not informed.     Assessment/Plan: 30yo G2P0010 at [redacted]w[redacted]d with preeclampsia with severe features, IUGR   Patient continues to be asymptomatic.  PIH labs WNL this morning.  Will do 2x/week q Wed/Sat. S/P MFM consult 24 hour urine yesterday 704 mg Patient immediately put back on monitor once I saw decelerations just prior to d/c monitoring.  Will order BPP for today. Procardia increased to 30 XL BID.  Will titrate up prn.  Expressed to nursing I prefer to manage po meds prn and reserve IV protocol when unable to control with po meds. S/p BMZ 8/24, 8/25 S/p NICU consult DVT Ppx: SCDs        9/24, DO

## 2021-02-28 NOTE — Progress Notes (Signed)
Antepartum Progress Note   Patient had a great night of rest last night facilitated by vistaril.  Active FM.  Patient denies HA, vision change, RUQ pain, CP/SOB.   History   Vitals with BMI 02/28/2021 02/28/2021 02/28/2021  Height - - -  Weight - 236 lbs 2 oz -  BMI - 36.97 -  Systolic 146 131 177  Diastolic 96 87 86  Pulse 61 63 60    CBC Latest Ref Rng & Units 02/27/2021 02/24/2021 02/22/2021  WBC 4.0 - 10.5 K/uL 12.2(H) 8.4 10.5  Hemoglobin 12.0 - 15.0 g/dL 11.6(L) 12.1 12.5  Hematocrit 36.0 - 46.0 % 34.4(L) 35.1(L) 36.1  Platelets 150 - 400 K/uL 175 179 188   CMP Latest Ref Rng & Units 02/27/2021 02/24/2021 02/22/2021  Glucose 70 - 99 mg/dL 89 86 80  BUN 6 - 20 mg/dL 11 6 5(L)  Creatinine 9.39 - 1.00 mg/dL 0.30 0.92 3.30  Sodium 135 - 145 mmol/L 136 134(L) 135  Potassium 3.5 - 5.1 mmol/L 4.2 3.9 4.3  Chloride 98 - 111 mmol/L 106 106 105  CO2 22 - 32 mmol/L 23 21(L) 23  Calcium 8.9 - 10.3 mg/dL 0.7(M) 9.0 9.1  Total Protein 6.5 - 8.1 g/dL 5.9(L) 5.8(L) 6.0(L)  Total Bilirubin 0.3 - 1.2 mg/dL 0.3 0.4 0.4  Alkaline Phos 38 - 126 U/L 59 63 62  AST 15 - 41 U/L 17 17 18   ALT 0 - 44 U/L 14 13 14      Exam   Physical Exam: Gen: NAD Chest: nonlabored breathing CV: no peripheral edema Abdomen: gravid, soft and nontender Ext: no evidence of DVT Neuro: reflexes brisk (unchanged); no clonus   FHT: Cat I U/S done yesterday for BPP and 8/8; final report pending     Assessment/Plan: 30yo G2P0010 at [redacted]w[redacted]d with preeclampsia with severe features, IUGR   Patient continues to be asymptomatic.  PIH labs WNL yesterday; plan 2x/week q Wed/Sat. S/P MFM consult Plan BPP/Dopplers weekly (Thursdays) Growth due 9/15 NST q shift Procardia 30 XL BID.  Will titrate up prn.   S/p BMZ 8/24, 8/25 S/p NICU consult DVT Ppx: SCDs Plan delivery by 34 weeks.  Patient expresses planning primary C/S rather than IOL.        9/24, DO

## 2021-03-01 ENCOUNTER — Encounter (HOSPITAL_COMMUNITY): Payer: Self-pay | Admitting: Obstetrics and Gynecology

## 2021-03-01 ENCOUNTER — Inpatient Hospital Stay (HOSPITAL_COMMUNITY): Payer: BC Managed Care – PPO | Admitting: Anesthesiology

## 2021-03-01 ENCOUNTER — Encounter (HOSPITAL_COMMUNITY)
Admission: AD | Disposition: A | Discharge status: Home / Self Care | Payer: Self-pay | Source: Home / Self Care | Attending: Obstetrics and Gynecology

## 2021-03-01 DIAGNOSIS — O1410 Severe pre-eclampsia, unspecified trimester: Secondary | ICD-10-CM | POA: Diagnosis present

## 2021-03-01 LAB — CBC
HCT: 40.9 % (ref 36.0–46.0)
Hemoglobin: 14.1 g/dL (ref 12.0–15.0)
MCH: 30.7 pg (ref 26.0–34.0)
MCHC: 34.5 g/dL (ref 30.0–36.0)
MCV: 88.9 fL (ref 80.0–100.0)
Platelets: 198 10*3/uL (ref 150–400)
RBC: 4.6 MIL/uL (ref 3.87–5.11)
RDW: 13.4 % (ref 11.5–15.5)
WBC: 14.6 10*3/uL — ABNORMAL HIGH (ref 4.0–10.5)
nRBC: 0 % (ref 0.0–0.2)

## 2021-03-01 LAB — COMPREHENSIVE METABOLIC PANEL
ALT: 33 U/L (ref 0–44)
AST: 26 U/L (ref 15–41)
Albumin: 2.7 g/dL — ABNORMAL LOW (ref 3.5–5.0)
Alkaline Phosphatase: 76 U/L (ref 38–126)
Anion gap: 8 (ref 5–15)
BUN: 11 mg/dL (ref 6–20)
CO2: 22 mmol/L (ref 22–32)
Calcium: 7.6 mg/dL — ABNORMAL LOW (ref 8.9–10.3)
Chloride: 104 mmol/L (ref 98–111)
Creatinine, Ser: 0.55 mg/dL (ref 0.44–1.00)
GFR, Estimated: >60 mL/min (ref >60)
Glucose, Bld: 112 mg/dL — ABNORMAL HIGH (ref 70–99)
Potassium: 4.2 mmol/L (ref 3.5–5.1)
Sodium: 134 mmol/L — ABNORMAL LOW (ref 135–145)
Total Bilirubin: 0.7 mg/dL (ref 0.3–1.2)
Total Protein: 5.8 g/dL — ABNORMAL LOW (ref 6.5–8.1)

## 2021-03-01 SURGERY — Surgical Case
Anesthesia: Spinal

## 2021-03-01 MED ORDER — OXYCODONE HCL 5 MG/5ML PO SOLN: 5.0000 mg | Freq: Once | ORAL | Status: DC | PRN

## 2021-03-01 MED ORDER — MEPERIDINE HCL 25 MG/ML IJ SOLN: 6.2500 mg | INTRAMUSCULAR | Status: DC | PRN

## 2021-03-01 MED ORDER — CEFAZOLIN SODIUM-DEXTROSE 2-4 GM/100ML-% IV SOLN
2.0000 g | INTRAVENOUS | Status: AC
  Administered 2021-03-01: 2 g via INTRAVENOUS

## 2021-03-01 MED ORDER — SIMETHICONE 80 MG PO CHEW
80.0000 mg | CHEWABLE_TABLET | Freq: Three times a day (TID) | ORAL | Status: DC
  Administered 2021-03-02 – 2021-03-05 (×9): 80 mg via ORAL
  Filled 2021-03-01 (×10): qty 1

## 2021-03-01 MED ORDER — KETOROLAC TROMETHAMINE 30 MG/ML IJ SOLN
30.0000 mg | Freq: Four times a day (QID) | INTRAMUSCULAR | Status: AC | PRN
Start: 2021-03-01 — End: 2021-03-02
  Administered 2021-03-01 – 2021-03-02 (×4): 30 mg via INTRAVENOUS
  Filled 2021-03-01 (×3): qty 1

## 2021-03-01 MED ORDER — WITCH HAZEL-GLYCERIN EX PADS: 1.0000 "application " | MEDICATED_PAD | CUTANEOUS | Status: DC | PRN

## 2021-03-01 MED ORDER — MORPHINE SULFATE (PF) 0.5 MG/ML IJ SOLN
INTRAMUSCULAR | Status: DC | PRN
  Administered 2021-03-01: .15 mg via INTRATHECAL

## 2021-03-01 MED ORDER — SIMETHICONE 80 MG PO CHEW: 80.0000 mg | CHEWABLE_TABLET | ORAL | Status: DC | PRN

## 2021-03-01 MED ORDER — PROMETHAZINE HCL 25 MG/ML IJ SOLN: 6.2500 mg | INTRAMUSCULAR | Status: DC | PRN

## 2021-03-01 MED ORDER — ONDANSETRON HCL 4 MG/2ML IJ SOLN
INTRAMUSCULAR | Status: DC | PRN
  Administered 2021-03-01: 4 mg via INTRAVENOUS

## 2021-03-01 MED ORDER — FENTANYL CITRATE (PF) 100 MCG/2ML IJ SOLN
INTRAMUSCULAR | Status: DC | PRN
  Administered 2021-03-01: 15 ug via INTRATHECAL

## 2021-03-01 MED ORDER — LABETALOL HCL 200 MG PO TABS
200.0000 mg | ORAL_TABLET | Freq: Two times a day (BID) | ORAL | Status: DC
  Administered 2021-03-01: 200 mg via ORAL
  Filled 2021-03-01 (×2): qty 1

## 2021-03-01 MED ORDER — NIFEDIPINE ER OSMOTIC RELEASE 30 MG PO TB24
30.0000 mg | ORAL_TABLET | Freq: Every day | ORAL | Status: DC
  Administered 2021-03-01: 30 mg via ORAL
  Filled 2021-03-01: qty 1

## 2021-03-01 MED ORDER — OXYCODONE HCL 5 MG PO TABS
5.0000 mg | ORAL_TABLET | ORAL | Status: DC | PRN
  Administered 2021-03-03 – 2021-03-05 (×8): 5 mg via ORAL
  Filled 2021-03-01 (×8): qty 1

## 2021-03-01 MED ORDER — OXYTOCIN-SODIUM CHLORIDE 30-0.9 UT/500ML-% IV SOLN
INTRAVENOUS | Status: DC | PRN
  Administered 2021-03-01: 30 [IU] via INTRAVENOUS

## 2021-03-01 MED ORDER — ACETAMINOPHEN 325 MG PO TABS
650.0000 mg | ORAL_TABLET | ORAL | Status: DC | PRN
  Administered 2021-03-02 – 2021-03-05 (×4): 650 mg via ORAL
  Filled 2021-03-01 (×4): qty 2

## 2021-03-01 MED ORDER — SOD CITRATE-CITRIC ACID 500-334 MG/5ML PO SOLN
30.0000 mL | Freq: Once | ORAL | Status: AC
  Administered 2021-03-01: 30 mL via ORAL
  Filled 2021-03-01: qty 30

## 2021-03-01 MED ORDER — DIPHENHYDRAMINE HCL 25 MG PO CAPS: 25.0000 mg | ORAL_CAPSULE | Freq: Four times a day (QID) | ORAL | Status: DC | PRN

## 2021-03-01 MED ORDER — ACETAMINOPHEN 500 MG PO TABS
1000.0000 mg | ORAL_TABLET | Freq: Four times a day (QID) | ORAL | Status: AC
  Administered 2021-03-02 (×3): 1000 mg via ORAL
  Filled 2021-03-01 (×3): qty 2

## 2021-03-01 MED ORDER — IBUPROFEN 600 MG PO TABS
600.0000 mg | ORAL_TABLET | Freq: Four times a day (QID) | ORAL | Status: AC
  Administered 2021-03-02 – 2021-03-04 (×8): 600 mg via ORAL
  Filled 2021-03-01 (×9): qty 1

## 2021-03-01 MED ORDER — DIBUCAINE (PERIANAL) 1 % EX OINT: 1.0000 "application " | TOPICAL_OINTMENT | CUTANEOUS | Status: DC | PRN

## 2021-03-01 MED ORDER — PHENYLEPHRINE HCL-NACL 20-0.9 MG/250ML-% IV SOLN
INTRAVENOUS | Status: DC | PRN
  Administered 2021-03-01: 60 ug/min via INTRAVENOUS

## 2021-03-01 MED ORDER — LABETALOL HCL 200 MG PO TABS
200.0000 mg | ORAL_TABLET | Freq: Two times a day (BID) | ORAL | Status: DC
  Filled 2021-03-01: qty 1

## 2021-03-01 MED ORDER — SCOPOLAMINE 1 MG/3DAYS TD PT72
1.0000 | MEDICATED_PATCH | Freq: Once | TRANSDERMAL | Status: AC
  Administered 2021-03-01: 1.5 mg via TRANSDERMAL

## 2021-03-01 MED ORDER — NALBUPHINE HCL 10 MG/ML IJ SOLN: 5.0000 mg | INTRAMUSCULAR | Status: DC | PRN

## 2021-03-01 MED ORDER — MAGNESIUM SULFATE BOLUS VIA INFUSION
6.0000 g | Freq: Once | INTRAVENOUS | Status: AC
  Administered 2021-03-01: 6 g via INTRAVENOUS
  Filled 2021-03-01: qty 1000

## 2021-03-01 MED ORDER — NIFEDIPINE ER OSMOTIC RELEASE 30 MG PO TB24
30.0000 mg | ORAL_TABLET | Freq: Once | ORAL | Status: AC
  Administered 2021-03-01: 30 mg via ORAL
  Filled 2021-03-01: qty 1

## 2021-03-01 MED ORDER — ACETAMINOPHEN 325 MG PO TABS: 650.0000 mg | ORAL_TABLET | ORAL | Status: DC | PRN

## 2021-03-01 MED ORDER — SCOPOLAMINE 1 MG/3DAYS TD PT72: 1.0000 | MEDICATED_PATCH | Freq: Once | TRANSDERMAL | Status: DC

## 2021-03-01 MED ORDER — MORPHINE SULFATE (PF) 0.5 MG/ML IJ SOLN
INTRAMUSCULAR | Status: AC
  Filled 2021-03-01: qty 10

## 2021-03-01 MED ORDER — SERTRALINE HCL 50 MG PO TABS
100.0000 mg | ORAL_TABLET | Freq: Every day | ORAL | Status: DC
  Administered 2021-03-02 – 2021-03-04 (×4): 100 mg via ORAL
  Filled 2021-03-01 (×4): qty 2

## 2021-03-01 MED ORDER — DIPHENHYDRAMINE HCL 50 MG/ML IJ SOLN: 12.5000 mg | INTRAMUSCULAR | Status: DC | PRN

## 2021-03-01 MED ORDER — LACTATED RINGERS IV SOLN: INTRAVENOUS | Status: DC

## 2021-03-01 MED ORDER — HYDRALAZINE HCL 20 MG/ML IJ SOLN: 10.0000 mg | INTRAMUSCULAR | Status: DC | PRN

## 2021-03-01 MED ORDER — OXYCODONE HCL 5 MG PO TABS: 5.0000 mg | ORAL_TABLET | Freq: Once | ORAL | Status: DC | PRN

## 2021-03-01 MED ORDER — ACETAMINOPHEN 10 MG/ML IV SOLN: 1000.0000 mg | Freq: Once | INTRAVENOUS | Status: DC | PRN

## 2021-03-01 MED ORDER — SCOPOLAMINE 1 MG/3DAYS TD PT72
MEDICATED_PATCH | TRANSDERMAL | Status: AC
  Filled 2021-03-01: qty 1

## 2021-03-01 MED ORDER — KETOROLAC TROMETHAMINE 30 MG/ML IJ SOLN
INTRAMUSCULAR | Status: AC
  Filled 2021-03-01: qty 1

## 2021-03-01 MED ORDER — NALBUPHINE HCL 10 MG/ML IJ SOLN: 5.0000 mg | Freq: Once | INTRAMUSCULAR | Status: DC | PRN

## 2021-03-01 MED ORDER — NALOXONE HCL 0.4 MG/ML IJ SOLN: 0.4000 mg | INTRAMUSCULAR | Status: DC | PRN

## 2021-03-01 MED ORDER — NIFEDIPINE ER OSMOTIC RELEASE 60 MG PO TB24: 60.0000 mg | ORAL_TABLET | Freq: Every day | ORAL | Status: DC

## 2021-03-01 MED ORDER — COCONUT OIL OIL
1.0000 "application " | TOPICAL_OIL | Status: DC | PRN
  Administered 2021-03-05: 1 via TOPICAL

## 2021-03-01 MED ORDER — LABETALOL HCL 5 MG/ML IV SOLN: 80.0000 mg | INTRAVENOUS | Status: DC | PRN

## 2021-03-01 MED ORDER — OXYTOCIN-SODIUM CHLORIDE 30-0.9 UT/500ML-% IV SOLN: 2.5000 [IU]/h | INTRAVENOUS | Status: AC

## 2021-03-01 MED ORDER — NALBUPHINE HCL 10 MG/ML IJ SOLN
5.0000 mg | Freq: Once | INTRAMUSCULAR | Status: DC | PRN
Start: 2021-03-01 — End: 2021-03-05

## 2021-03-01 MED ORDER — ONDANSETRON HCL 4 MG/2ML IJ SOLN: 4.0000 mg | Freq: Three times a day (TID) | INTRAMUSCULAR | Status: DC | PRN

## 2021-03-01 MED ORDER — POVIDONE-IODINE 10 % EX SWAB: 2.0000 "application " | Freq: Once | CUTANEOUS | Status: DC

## 2021-03-01 MED ORDER — FENTANYL CITRATE (PF) 100 MCG/2ML IJ SOLN: 25.0000 ug | INTRAMUSCULAR | Status: DC | PRN

## 2021-03-01 MED ORDER — SODIUM CHLORIDE 0.9 % IV SOLN: INTRAVENOUS | Status: DC | PRN

## 2021-03-01 MED ORDER — NALOXONE HCL 4 MG/10ML IJ SOLN
1.0000 ug/kg/h | INTRAVENOUS | Status: DC | PRN
  Filled 2021-03-01: qty 5

## 2021-03-01 MED ORDER — BUPIVACAINE IN DEXTROSE 0.75-8.25 % IT SOLN
INTRATHECAL | Status: DC | PRN
  Administered 2021-03-01: 1.5 mL via INTRATHECAL

## 2021-03-01 MED ORDER — NIFEDIPINE ER OSMOTIC RELEASE 30 MG PO TB24
30.0000 mg | ORAL_TABLET | Freq: Two times a day (BID) | ORAL | Status: DC
  Administered 2021-03-02 – 2021-03-03 (×5): 30 mg via ORAL
  Filled 2021-03-01 (×5): qty 1

## 2021-03-01 MED ORDER — TETANUS-DIPHTH-ACELL PERTUSSIS 5-2.5-18.5 LF-MCG/0.5 IM SUSY
0.5000 mL | PREFILLED_SYRINGE | Freq: Once | INTRAMUSCULAR | Status: AC
  Administered 2021-03-04: 0.5 mL via INTRAMUSCULAR
  Filled 2021-03-01: qty 0.5

## 2021-03-01 MED ORDER — KETOROLAC TROMETHAMINE 30 MG/ML IJ SOLN: 30.0000 mg | Freq: Four times a day (QID) | INTRAMUSCULAR | Status: AC | PRN

## 2021-03-01 MED ORDER — SODIUM CHLORIDE 0.9% FLUSH: 3.0000 mL | INTRAVENOUS | Status: DC | PRN

## 2021-03-01 MED ORDER — MAGNESIUM SULFATE 40 GM/1000ML IV SOLN
2.0000 g/h | INTRAVENOUS | Status: AC
  Administered 2021-03-01: 2 g/h via INTRAVENOUS
  Filled 2021-03-01: qty 1000

## 2021-03-01 MED ORDER — LABETALOL HCL 5 MG/ML IV SOLN: 40.0000 mg | INTRAVENOUS | Status: DC | PRN

## 2021-03-01 MED ORDER — PRENATAL MULTIVITAMIN CH
1.0000 | ORAL_TABLET | Freq: Every day | ORAL | Status: DC
  Administered 2021-03-02 – 2021-03-05 (×4): 1 via ORAL
  Filled 2021-03-01 (×4): qty 1

## 2021-03-01 MED ORDER — LABETALOL HCL 5 MG/ML IV SOLN: 20.0000 mg | INTRAVENOUS | Status: DC | PRN

## 2021-03-01 MED ORDER — FENTANYL CITRATE (PF) 100 MCG/2ML IJ SOLN
INTRAMUSCULAR | Status: AC
  Filled 2021-03-01: qty 2

## 2021-03-01 MED ORDER — SENNOSIDES-DOCUSATE SODIUM 8.6-50 MG PO TABS
2.0000 | ORAL_TABLET | Freq: Every day | ORAL | Status: DC
  Administered 2021-03-02 – 2021-03-05 (×4): 2 via ORAL
  Filled 2021-03-01 (×4): qty 2

## 2021-03-01 MED ORDER — PHENYLEPHRINE HCL-NACL 20-0.9 MG/250ML-% IV SOLN
INTRAVENOUS | Status: AC
  Filled 2021-03-01: qty 250

## 2021-03-01 MED ORDER — CEFAZOLIN SODIUM-DEXTROSE 2-4 GM/100ML-% IV SOLN
INTRAVENOUS | Status: AC
  Filled 2021-03-01: qty 100

## 2021-03-01 MED ORDER — MENTHOL 3 MG MT LOZG: 1.0000 | LOZENGE | OROMUCOSAL | Status: DC | PRN

## 2021-03-01 MED ORDER — MAGNESIUM SULFATE 40 GM/1000ML IV SOLN
2.0000 g/h | INTRAVENOUS | Status: DC
  Administered 2021-03-01: 2 g/h via INTRAVENOUS
  Filled 2021-03-01: qty 1000

## 2021-03-01 SURGICAL SUPPLY — 31 items
BENZOIN TINCTURE PRP APPL 2/3 (GAUZE/BANDAGES/DRESSINGS) ×2 IMPLANT
CHLORAPREP W/TINT 26ML (MISCELLANEOUS) ×2 IMPLANT
CLAMP CORD UMBIL (MISCELLANEOUS) IMPLANT
CLOSURE STERI STRIP 1/2 X4 (GAUZE/BANDAGES/DRESSINGS) ×2 IMPLANT
CLOTH BEACON ORANGE TIMEOUT ST (SAFETY) ×2 IMPLANT
DRSG OPSITE POSTOP 4X10 (GAUZE/BANDAGES/DRESSINGS) ×2 IMPLANT
ELECT REM PT RETURN 9FT ADLT (ELECTROSURGICAL) ×2
ELECTRODE REM PT RTRN 9FT ADLT (ELECTROSURGICAL) ×1 IMPLANT
EXTRACTOR VACUUM M CUP 4 TUBE (SUCTIONS) IMPLANT
GLOVE BIOGEL PI IND STRL 7.0 (GLOVE) ×2 IMPLANT
GLOVE BIOGEL PI IND STRL 7.5 (GLOVE) ×1 IMPLANT
GLOVE BIOGEL PI INDICATOR 7.0 (GLOVE) ×2
GLOVE BIOGEL PI INDICATOR 7.5 (GLOVE) ×1
GLOVE ECLIPSE 7.0 STRL STRAW (GLOVE) ×2 IMPLANT
GOWN STRL REUS W/TWL LRG LVL3 (GOWN DISPOSABLE) ×4 IMPLANT
KIT ABG SYR 3ML LUER SLIP (SYRINGE) IMPLANT
NEEDLE HYPO 25X5/8 SAFETYGLIDE (NEEDLE) IMPLANT
NS IRRIG 1000ML POUR BTL (IV SOLUTION) ×2 IMPLANT
PACK C SECTION WH (CUSTOM PROCEDURE TRAY) ×2 IMPLANT
PAD OB MATERNITY 4.3X12.25 (PERSONAL CARE ITEMS) ×2 IMPLANT
PENCIL SMOKE EVAC W/HOLSTER (ELECTROSURGICAL) ×2 IMPLANT
STRIP CLOSURE SKIN 1/2X4 (GAUZE/BANDAGES/DRESSINGS) IMPLANT
SUT PDS AB 0 CTX 60 (SUTURE) IMPLANT
SUT PLAIN 0 NONE (SUTURE) IMPLANT
SUT VIC AB 0 CT1 36 (SUTURE) ×2 IMPLANT
SUT VIC AB 0 CTX 36 (SUTURE) ×2
SUT VIC AB 0 CTX36XBRD ANBCTRL (SUTURE) ×2 IMPLANT
SUT VIC AB 4-0 KS 27 (SUTURE) ×2 IMPLANT
TOWEL OR 17X24 6PK STRL BLUE (TOWEL DISPOSABLE) ×2 IMPLANT
TRAY FOLEY W/BAG SLVR 14FR LF (SET/KITS/TRAYS/PACK) ×2 IMPLANT
WATER STERILE IRR 1000ML POUR (IV SOLUTION) ×2 IMPLANT

## 2021-03-01 NOTE — Op Note (Signed)
PROCEDURE DATE: 03/01/2021   PREOPERATIVE DIAGNOSIS: Severe preeclampsia, IUGR   POSTOPERATIVE DIAGNOSIS: The same   PROCEDURE:    Primary Low Transverse Cesarean Section   SURGEON:  Dr. Nilda Simmer   INDICATIONS: This is a 30yo G2P0010 at [redacted]w[redacted]d wga requiring cesarean section secondary to severe preeclampsia (severe range BP and headache) as well as IUGR. Her blood pressure had shown progression and she required increased antihypertensive therapy.  FHT also revealed late decelerations. She was steroid complete following course on 8/24 and 8/25. She received 12 hrs of neuroprotective magnesium immediately prior to delivery.  Decision made to proceed with LTCS. The risks of cesarean section discussed with the patient included but were not limited to: bleeding which may require transfusion or reoperation; infection which may require antibiotics; injury to bowel, bladder, ureters or other surrounding organs; injury to the fetus; need for additional procedures including hysterectomy in the event of a life-threatening hemorrhage; placental abnormalities wth subsequent pregnancies, incisional problems, thromboembolic phenomenon and other postoperative/anesthesia complications. The patient agreed with the proposed plan, giving informed consent for the procedure.    She was also counseled regarding need for classical incision for delivery and the implications of such.   FINDINGS:  Viable female infant in breech presentation, APGARs 7, 9,  Weight pending, Amniotic fluid clear,  Intact placenta, three vessel cord.  Grossly normal uterus. .   ANESTHESIA:    Epidural ESTIMATED BLOOD LOSS: 500 cc SPECIMENS: Placenta for pathology COMPLICATIONS: None immediate    PROCEDURE IN DETAIL:  The patient received intravenous antibiotics (2g Ancef) and had sequential compression devices applied to her lower extremities while in the preoperative area.  She was then taken to the operating room where epidural  anesthesia was dosed up to surgical level and was found to be adequate. She was then placed in a dorsal supine position with a leftward tilt, and prepped and draped in a sterile manner.  A foley catheter was placed into her bladder and attached to constant gravity.  After an adequate timeout was performed, a Pfannenstiel skin incision was made with scalpel and carried through to the underlying layer of fascia. The fascia was incised in the midline and this incision was extended bilaterally using the Mayo scissors. Kocher clamps were applied to the superior aspect of the fascial incision and the underlying rectus muscles were dissected off bluntly. A similar process was carried out on the inferior aspect of the facial incision. The rectus muscles were separated in the midline bluntly and the peritoneum was entered bluntly.  A bladder flap was created sharply and developed bluntly. A transverse hysterotomy was made with a scalpel and extended bilaterally bluntly. The bladder blade was then removed. The infant was successfully delivered via breech maneuvers, and cord was clamped and cut and infant was handed over to awaiting neonatology team. Uterine massage was then administered and the placenta delivered intact with three-vessel cord. Cord gases were taken. The uterus was cleared of clot and debris.  The hysterotomy was closed with 0 vicryl.  A second imbricating suture of 0-vicryl was used to reinforce the incision and aid in hemostasis.The fascia was closed with 0-Vicryl in a running fashion with good restoration of anatomy.  The subcutaneus tissue was irrigated and was reapproximated using three interrupted plain gut stitches.  The skin was closed with 4-0 Vicryl in a subcuticular fashion.  All surgical site and was hemostatic at end of procedure without any further bleeding on exam.    Pt tolerated the procedure  well. All sponge/lap/needle counts were correct  X 2. Pt taken to recovery room in stable  condition.     Nilda Simmer MD

## 2021-03-01 NOTE — Anesthesia Preprocedure Evaluation (Addendum)
Anesthesia Evaluation  Patient identified by MRN, date of birth, ID band Patient awake    Reviewed: Allergy & Precautions, Patient's Chart, lab work & pertinent test results  Airway Mallampati: III  TM Distance: >3 FB Neck ROM: Full    Dental no notable dental hx.    Pulmonary asthma ,    Pulmonary exam normal breath sounds clear to auscultation       Cardiovascular hypertension (severe preeclampsia on Mag), Pt. on medications Normal cardiovascular exam Rhythm:Regular Rate:Normal     Neuro/Psych  Headaches, PSYCHIATRIC DISORDERS Anxiety Depression    GI/Hepatic negative GI ROS, Neg liver ROS,   Endo/Other  obesity  Renal/GU negative Renal ROS  negative genitourinary   Musculoskeletal negative musculoskeletal ROS (+)   Abdominal (+) + obese,   Peds negative pediatric ROS (+)  Hematology negative hematology ROS (+)   Anesthesia Other Findings   Reproductive/Obstetrics (+) Pregnancy                            Anesthesia Physical Anesthesia Plan  ASA: 3  Anesthesia Plan: Spinal   Post-op Pain Management:    Induction:   PONV Risk Score and Plan: 2 and Treatment may vary due to age or medical condition  Airway Management Planned: Natural Airway  Additional Equipment: None  Intra-op Plan:   Post-operative Plan:   Informed Consent: I have reviewed the patients History and Physical, chart, labs and discussed the procedure including the risks, benefits and alternatives for the proposed anesthesia with the patient or authorized representative who has indicated his/her understanding and acceptance.     Dental advisory given  Plan Discussed with: CRNA and Anesthesiologist  Anesthesia Plan Comments: (Spinal. GETA as backup. Patient needs CBC prior to going to OR, I notified her primary RN (Addie Pugh) Tanna Furry, MD  )      Anesthesia Quick Evaluation

## 2021-03-01 NOTE — Progress Notes (Signed)
Dr. Langston Masker notified of FHT baseline of 175 w/out 10x10 accels. Verbal orders received to leave pt on continuous monitoring overnight. Pt updated and agreeable to POC.

## 2021-03-01 NOTE — Progress Notes (Signed)
Antepartum Progress Note   Katherine [name] is a 30 y.o. female G2P0010 that is admitted to [facility] for severe preeclampsia and IUGR. Over the weekend she had progressively increased blood pressures as well as decelerations on FHT.  Antihypertensives were titrated up. She was started on neuroprotective magnesium at 04:25 with plans for delivery. Patient was counseled regarding mode of delivery and elected for primary C section.  This morning she has felt flushed, tired since onset of magnesium. She denies HA or vision change.   Denies ctx, LOF, VB.  Fetal movement present.   History   Blood pressure (!) 138/98, pulse (!) 115, temperature 98 F (36.7 C), temperature source Oral, resp. rate 16, height 5\' 7"  (1.702 m), weight 106.4 kg, last menstrual period 08/16/2020, SpO2 97 %. Exam  Physical Exam: Gen: Alert, appears tired Chest: nonlabored breathing CV: no peripheral edema Abdomen: gravid, soft and nontender Ext: no evidence of DVT  FHT: moderate variability, 10x10 present this morning, small variable decels present  Assessment/Plan: Admitted to [facility] for severe preeclampsia, growth restriction. Worsening status of preeclampsia, delivery indicated. Patient counseled extensively on primary C section, including risks, benefits, which include but are not limited to bleeding, infection, damage to nearby organs. We also discussed possible need for classical incision for delivery and that this would necessitate subsequent deliveries to be C section as well, and earlier gestation.  BMZ s/p BMZ 8/24 and 8/25.   Diet: NPO DVT Ppx: SCDs S/p NICU consult. Will await for 12 hr dose of NP magnesium, then proceed with primary C section.   9/25 03/01/2021, 10:17 AM

## 2021-03-01 NOTE — Progress Notes (Addendum)
Was called by RN with severe range BP at 2324 of 171/103.  Patient tends to have worsened anxiety at night and she chose not to take Ambien or Vistaril tonight.  I requested the patient be given Vistaril which has previously helped.  Patient continued to have severe range BP on recheck and so Procardia 30XL dose was given (previously 30 XL BID).  Patient continued to have severe range BP and I planned to start labetalol but HR 47; no labetalol given.  Normal HR in high 50s/low 60s.  Another dose of Procardia was given to max out 120 XL for this 24 hour period.  EKG was obtained which showed sinus bradycardia.  Another recheck of BP will be done about 30 minutes after Procardia dose and if still severe, will start IV hydralazine and magnesium sulfate for NP and seizure prophylaxis with plans to move forward with delivery for uncontrollable BPs.  No need to repeat BMZ dose as they were given 8/24 and 25.  Patient informed of this plan.    Mitchel Honour, DO

## 2021-03-01 NOTE — Transfer of Care (Signed)
Immediate Anesthesia Transfer of Care Note  Patient: Katherine [name]  Procedure(s) Performed: CESAREAN SECTION  Patient Location: PACU  Anesthesia Type:Spinal  Level of Consciousness: awake, alert  and oriented  Airway & Oxygen Therapy: Patient Spontanous Breathing  Post-op Assessment: Report given to RN and Post -op Vital signs reviewed and stable  Post vital signs: Reviewed and stable  Last Vitals:  Vitals Value Taken Time  BP 138/96 03/01/21 1752  Temp    Pulse 82 03/01/21 1755  Resp 20 03/01/21 1755  SpO2 100 % 03/01/21 1755  Vitals shown include unvalidated device data.  Last Pain:  Vitals:   03/01/21 1000  TempSrc:   PainSc: 3       Patients Stated Pain Goal: 5 (02/24/21 1204)  Complications: No notable events documented.

## 2021-03-01 NOTE — Progress Notes (Signed)
Patient continues to have severe range BPs after max dose Procardia and unable to give labetalol for bradycardia.  IV hydralazine per protocol initiated.  Will start magnesium sulfate.  Patient counseled re: plan to proceed to delivery.  She still strongly desire C/S rather than IOL.  She last ate at 8 pm last night.  Patient is counseled re: risk of bleeding, infection, scarring, and damage to surrounding structures.  She is counseled that given early GA, she may require high transverse or vertical uterine incision which would impact future pregnancies in timing of delivery and uterine rupture risk.  She is counseled regarding risk of abnormal placentation in future pregnancies.  Goal is to given magnesium sulfate x 12 hours but if fetal or maternal indication deteriorates, will do prior to that time.  Will post C/S at the appropriate time based on initiation of magnesium.    Mitchel Honour, DO

## 2021-03-01 NOTE — Consult Note (Signed)
Neonatology Note:   Attendance at C-section:    I was asked by Dr. Romeo Apple to attend this elective C/S at 28 1/7 for worsening pre-eclampsia. The mother, is a 29yo [id number] with good prenatal care complicated by pre-eclampsia. ROM 0h 25m prior to delivery, fluid clear. Infant vigorous with good spontaneous cry and tone. +Brief DCC then clamped and baby brought to ISR.  Baby placed on warming mattress and dried and stimulated.  Due to poor respiratory effort, CPAP initiated.  SaO2 also placed; initially in 60S.  Coarse lung sounds equal bilaterally. Fio2 adjusted to maintain appropriate levels, peak 80%.  Needed minimal bulb suctioning.   Lungs clearing to ausc on cpap 6 24%, good tone for GA and pink in DR.  Apgars  at 1 minute,  at 5 minutes.  Family updated; father accompanied [place] to NICU.  BW 880g.  No issues during transport.   Dineen Kid Leary Roca, MD

## 2021-03-02 ENCOUNTER — Encounter (HOSPITAL_COMMUNITY): Payer: Self-pay | Admitting: Obstetrics and Gynecology

## 2021-03-02 LAB — CBC
HCT: 36.4 % (ref 36.0–46.0)
Hemoglobin: 12 g/dL (ref 12.0–15.0)
MCH: 30.5 pg (ref 26.0–34.0)
MCHC: 33 g/dL (ref 30.0–36.0)
MCV: 92.4 fL (ref 80.0–100.0)
Platelets: 220 10*3/uL (ref 150–400)
RBC: 3.94 MIL/uL (ref 3.87–5.11)
RDW: 13.6 % (ref 11.5–15.5)
WBC: 17.3 10*3/uL — ABNORMAL HIGH (ref 4.0–10.5)
nRBC: 0 % (ref 0.0–0.2)

## 2021-03-02 LAB — COMPREHENSIVE METABOLIC PANEL
ALT: 29 U/L (ref 0–44)
AST: 24 U/L (ref 15–41)
Albumin: 2.5 g/dL — ABNORMAL LOW (ref 3.5–5.0)
Alkaline Phosphatase: 69 U/L (ref 38–126)
Anion gap: 7 (ref 5–15)
BUN: 13 mg/dL (ref 6–20)
CO2: 25 mmol/L (ref 22–32)
Calcium: 6.9 mg/dL — ABNORMAL LOW (ref 8.9–10.3)
Chloride: 103 mmol/L (ref 98–111)
Creatinine, Ser: 0.86 mg/dL (ref 0.44–1.00)
GFR, Estimated: >60 mL/min (ref >60)
Glucose, Bld: 123 mg/dL — ABNORMAL HIGH (ref 70–99)
Potassium: 5.3 mmol/L — ABNORMAL HIGH (ref 3.5–5.1)
Sodium: 135 mmol/L (ref 135–145)
Total Bilirubin: 0.6 mg/dL (ref 0.3–1.2)
Total Protein: 5.4 g/dL — ABNORMAL LOW (ref 6.5–8.1)

## 2021-03-02 NOTE — Anesthesia Postprocedure Evaluation (Signed)
Anesthesia Post Note  Patient: Katherine [name]  Procedure(s) Performed: CESAREAN SECTION     Patient location during evaluation: Mother Baby Anesthesia Type: Spinal Level of consciousness: oriented and awake and alert Pain management: pain level controlled Vital Signs Assessment: post-procedure vital signs reviewed and stable Respiratory status: spontaneous breathing and respiratory function stable Cardiovascular status: blood pressure returned to baseline and stable Postop Assessment: no headache, no backache, no apparent nausea or vomiting and able to ambulate Anesthetic complications: no   No notable events documented.  Last Vitals:  Vitals:   03/02/21 0922 03/02/21 1014  BP: 122/68 134/82  Pulse: (!) 115 86  Resp:    Temp:  36.9 C  SpO2:      Last Pain:  Vitals:   03/02/21 1014  TempSrc: Oral  PainSc:    Pain Goal: Patients Stated Pain Goal: 5 (02/24/21 1204)                 Mellody Dance

## 2021-03-02 NOTE — Progress Notes (Signed)
Postpartum Progress Note  Postpartum Day 1 s/p primary Cesarean section at 28 weeks for severe preeclampsia, IUGR.  Subjective:  Patient reports no overnight events.  Yesterday she felt dizzy upon standing. This resolved. She reports well controlled pain, ambulating without difficulty, voiding spontaneously, tolerating PO.  She reports Negative flatus, Negative BM.  Vaginal bleeding is minimal. She denies headache, vision changes.   Objective: Blood pressure (!) 132/92, pulse 73, temperature 98.7 F (37.1 C), temperature source Oral, resp. rate 18, height 5\' 7"  (1.702 m), weight 106.4 kg, last menstrual period 08/16/2020, SpO2 98 %, unknown if currently breastfeeding.  Physical Exam:  General: alert and no distress Lochia: appropriate Uterine Fundus: firm Incision: dressing in place, small shadowing, stable DVT Evaluation: No evidence of DVT seen on physical exam.  Recent Labs    03/01/21 1447  HGB 14.1  HCT 40.9    Assessment/Plan: Postpartum Day 1, s/p C-section Severe Preeclampsia Currently receiving 24 hrs PP magnesium. BP mostly mild range. Labetalol held due to lower BP while on mag.  Continue Procardia 30 XL BID. CBC and CMP this AM WNL. False hypocalcemia due to ongoing mag infusion.  Baby girl in NICU Lactation following Doing well, continue postpartum care.   LOS: 6 days   03/03/21 03/02/2021, 6:32 AM

## 2021-03-02 NOTE — Lactation Note (Signed)
This note was copied from a baby's chart. Lactation Consultation Note  Patient Name: Katherine [name] [id number] Date: 03/02/2021 Reason for consult: Initial assessment;Preterm <34wks;Primapara;1st time breastfeeding;NICU baby;Infant < 6lbs Age:31 hours  Visited with mom of 28 2/7 (adjusted) NICU female, she's a P1 and reported moderate breast changes during the pregnancy. LC assisted mom with hand expression and set up a DEBP at 18 hours of life.   Mom started pumping during [facility] consultation, praised her for her efforts. Reviewed pumping schedule, pumping log, expectation and benefits of premature milk.  Plan of care:  Encouraged mom to pump every 2-3 hours, at least 8 times/24 hours Breast massage, hand expression and coconut oil were also encouraged prior pumping  BF brochure, BF resources and NICU booklet were reviewed. FOB present and supportive. All questions and concerns answered, mom to call NICU LC PRN.  Maternal Data Has patient been taught Hand Expression?: Yes Does the patient have breastfeeding experience prior to this delivery?: No  Feeding Mother's Current Feeding Choice: Breast Milk  Lactation Tools Discussed/Used Tools: Pump;Flanges;Coconut oil Flange Size: 24 Breast pump type: Double-Electric Breast Pump Pump Education: Setup, frequency, and cleaning;Milk Storage Reason for Pumping: pre-term infant in NICU Pumping frequency: q 3 hours Pumped volume:  (drops)  Interventions Interventions: Breast feeding basics reviewed;DEBP;Education;Hand express;Breast massage;Coconut oil  Discharge Pump: DEBP;Personal (Spectra S2 at home) [facility] Program: No  Consult Status Consult Status: Follow-up Follow-up type: In-patient   Katherine [name] 03/02/2021, 11:31 AM

## 2021-03-03 ENCOUNTER — Ambulatory Visit: Payer: BC Managed Care – PPO

## 2021-03-03 NOTE — Progress Notes (Signed)
Subjective: Postpartum Day 2: Cesarean Delivery Patient reports incisional pain, tolerating PO, + flatus, and no problems voiding.    Objective: Vital signs in last 24 hours: Temp:  [98 F (36.7 C)-98.6 F (37 C)] 98.2 F (36.8 C) (08/31 0835) Pulse Rate:  [71-91] 71 (08/31 0835) Resp:  [18] 18 (08/31 0835) BP: (133-149)/(86-98) 149/98 (08/31 0835) SpO2:  [97 %-100 %] 99 % (08/31 0835)  Physical Exam:  General: alert, cooperative, appears stated age, and mild distress Lochia: appropriate Uterine Fundus: firm Incision: healing well DVT Evaluation: No evidence of DVT seen on physical exam.  Recent Labs    03/01/21 1447 03/02/21 0618  HGB 14.1 12.0  HCT 40.9 36.4    Assessment/Plan: Status post Cesarean section. Postoperative course complicated by severe preeclampsia.  Now off mag with BPs improving on procardia (changed from labetalol due to low pulse)   Continue current care Daughter is doing well on rm air in NICU (311).  Turner Daniels 03/03/2021, 11:19 AM

## 2021-03-03 NOTE — Lactation Note (Signed)
This note was copied from a baby's chart. Lactation Consultation Note LC assisted with pumping and addressed all questions/concerns. Mother is pumping q3 today without difficulty.   Patient Name: Girl Merridy Pascoe [id number] Date: 03/03/2021 Reason for consult: Follow-up assessment Age:31 hours  Maternal Data  + breast changes  Lactation Tools Discussed/Used Pumping frequency: q3  Interventions Interventions: Education  Discharge Pump: Personal (Spectra)  Consult Status Consult Status: Follow-up Date: 03/03/21 Follow-up type: In-patient   [name], MA IBCLC 03/03/2021, 2:44 PM

## 2021-03-04 LAB — RPR: RPR Ser Ql: NONREACTIVE

## 2021-03-04 MED ORDER — NIFEDIPINE ER OSMOTIC RELEASE 60 MG PO TB24
60.0000 mg | ORAL_TABLET | Freq: Two times a day (BID) | ORAL | Status: DC
  Administered 2021-03-04 – 2021-03-05 (×3): 60 mg via ORAL
  Filled 2021-03-04 (×3): qty 1

## 2021-03-04 MED ORDER — LABETALOL HCL 200 MG PO TABS
200.0000 mg | ORAL_TABLET | Freq: Three times a day (TID) | ORAL | Status: DC
  Administered 2021-03-04 – 2021-03-05 (×3): 200 mg via ORAL
  Filled 2021-03-04 (×3): qty 1

## 2021-03-04 NOTE — Lactation Note (Signed)
This note was copied from a baby's chart. Lactation Consultation Note  Patient Name: Girl Payson Crumby [id number] Date: 03/04/2021 Reason for consult: Follow-up assessment;Primapara;1st time breastfeeding;NICU baby;Preterm <34wks Age:31 hours  Lactation followed up with Mrs. Romeo Apple. She is planning to visit baby "[name]" in a few minutes. Ms. Bisig reports that she is using a pumping app to track her frequency and amounts. Last night was the first night she pumped a more significant volume of breast milk (10 mls). She has colostrum in the refrigerator and will take that to the NICU. She has labels in the NICU that she will bring down. We discussed importance of labeling her EBM. I also educated on the benefits of colostrum and what to expect when her milk "comes in." Ms. Silverthorne still has HBP which is being monitored. She had pre-e. We discussed effects of pre-e and lactogenesis 2. Ms. Skorupski was smiling today and happy that she is able to express some milk for baby. I provided praise and reassurance. I reviewed educated on pumping basics.   Maternal Data Does the patient have breastfeeding experience prior to this delivery?: No  Feeding Mother's Current Feeding Choice: Breast Milk and Donor Milk   Lactation Tools Discussed/Used Breast pump type: Double-Electric Breast Pump Pump Education: Setup, frequency, and cleaning;Milk Storage Reason for Pumping: NICU Pumping frequency: q 3 hours Pumped volume: 10 mL (10 mls)  Interventions Interventions: Breast feeding basics reviewed;Education  Discharge Pump: DEBP (Spectra) WIC Program: No  Consult Status Consult Status: Follow-up Date: 03/04/21 (needs day 4-5 follow up; day 3 today) Follow-up type: In-patient    Walker Shadow 03/04/2021, 10:39 AM

## 2021-03-04 NOTE — Progress Notes (Signed)
Subjective: Postpartum Day 3: Cesarean Delivery Patient reports tolerating PO, + flatus, and no problems voiding.  No HA, vision change, RUQ pain, CP/SOB.  Feeling much better.  Newborn doing well in NICU.  Objective: Vital signs in last 24 hours: Temp:  [97.8 F (36.6 C)-98.6 F (37 C)] 97.8 F (36.6 C) (09/01 0753) Pulse Rate:  [62-111] 75 (09/01 1013) Resp:  [17-18] 18 (09/01 0753) BP: (135-169)/(83-113) 160/99 (09/01 1013) SpO2:  [99 %-100 %] 100 % (09/01 0753) Weight:  [104.8 kg] 104.8 kg (09/01 0655)  Physical Exam:  General: alert, cooperative, and appears stated age 30: appropriate Uterine Fundus: firm Incision: healing well, no significant drainage, no dehiscence DVT Evaluation: No evidence of DVT seen on physical exam. Negative Homan's sign. No cords or calf tenderness.  Recent Labs    03/01/21 1447 03/02/21 0618  HGB 14.1 12.0  HCT 40.9 36.4    Assessment/Plan: Status post Cesarean section. Doing well postoperatively.  Continue current care. Pre-E with severe features-High mild range BPs.  Procardia increased from 30XL BID to 60XL BID.  Will closely monitor with goal for d/c tomorrow.  Mitchel Honour 03/04/2021, 10:49 AM

## 2021-03-04 NOTE — Progress Notes (Signed)
CSW met with parents at infant's bedside and attempted to complete psychosocial assessment. FOB reported that MOB needed to go and get a blood pressure check, CSW agreed to return at a later time to complete assessment.   Abundio Miu, Ruby Worker [facility] Cell#: [phone]

## 2021-03-04 NOTE — Plan of Care (Signed)
  Problem: Activity: Goal: Risk for activity intolerance will decrease Outcome: Completed/Met   Problem: Nutrition: Goal: Adequate nutrition will be maintained Outcome: Completed/Met   Problem: Coping: Goal: Level of anxiety will decrease Outcome: Completed/Met   Problem: Elimination: Goal: Will not experience complications related to bowel motility Outcome: Completed/Met Goal: Will not experience complications related to urinary retention Outcome: Completed/Met   Problem: Education: Goal: Knowledge of condition will improve Outcome: Completed/Met   Problem: Activity: Goal: Will verbalize the importance of balancing activity with adequate rest periods Outcome: Completed/Met Goal: Ability to tolerate increased activity will improve Outcome: Completed/Met   Problem: Coping: Goal: Ability to identify and utilize available resources and services will improve Outcome: Completed/Met   Problem: Role Relationship: Goal: Ability to demonstrate positive interaction with newborn will improve Outcome: Completed/Met   Problem: Clinical Measurements: Goal: Respiratory complications will improve Outcome: Not Applicable

## 2021-03-04 NOTE — Progress Notes (Signed)
Pt blood pressure was in severe range of 169/113, the recheck was 160/99 and then scheduled procardia 60mg  was given. Dr. was made aware at the bedside and was informed that pt wanted to go to NICU. Dr. Langston Masker didn't have a problem with this and pt will come down for a recheck.

## 2021-03-05 MED ORDER — ACETAMINOPHEN 325 MG PO TABS: 650.0000 mg | ORAL_TABLET | Freq: Four times a day (QID) | ORAL | 0 refills | Status: DC | PRN

## 2021-03-05 MED ORDER — OXYCODONE HCL 5 MG PO TABS: 5.0000 mg | ORAL_TABLET | Freq: Four times a day (QID) | ORAL | 0 refills | Status: DC | PRN

## 2021-03-05 MED ORDER — LABETALOL HCL 200 MG PO TABS: 200.0000 mg | ORAL_TABLET | Freq: Three times a day (TID) | ORAL | 1 refills | Status: DC

## 2021-03-05 MED ORDER — NIFEDIPINE ER 60 MG PO TB24: 60.0000 mg | ORAL_TABLET | Freq: Two times a day (BID) | ORAL | 1 refills | Status: DC

## 2021-03-05 NOTE — Plan of Care (Signed)
  Problem: Pain Managment: Goal: General experience of comfort will improve Outcome: Completed/Met   Problem: Education: Goal: Knowledge of disease or condition will improve Outcome: Completed/Met Goal: Knowledge of the prescribed therapeutic regimen will improve Outcome: Completed/Met   Problem: Education: Goal: Knowledge of disease or condition will improve Outcome: Completed/Met Goal: Knowledge of the prescribed therapeutic regimen will improve Outcome: Completed/Met   

## 2021-03-05 NOTE — Progress Notes (Signed)
Subjective: Postpartum Day 4: Cesarean Delivery Patient reports tolerating PO, + flatus, + BM, and no problems voiding.   Feeling better. No HA, no vision change, no SOB.  Objective: Vital signs in last 24 hours: Temp:  [97.8 F (36.6 C)-98.3 F (36.8 C)] 97.9 F (36.6 C) (09/02 0423) Pulse Rate:  [73-105] 88 (09/02 0423) Resp:  [18-97] 97 (09/02 0423) BP: (134-169)/(89-113) 138/89 (09/02 0423) SpO2:  [97 %-100 %] 97 % (09/02 0423) Weight:  [102.8 kg] 102.8 kg (09/02 0500)  Physical Exam:  General: alert, cooperative, and no distress Lochia: appropriate Uterine Fundus: firm Incision: healing well DVT Evaluation: No evidence of DVT seen on physical exam.  No results for input(s): HGB, HCT in the last 72 hours.  Assessment/Plan: Status post Cesarean section. Doing well postoperatively.  BPs normal x 2 this am with addition of labetalol 200mg  po TID to Procardia XL 60mg  BID. Will discharge home this afternoon. Discharge instructions reviewed. FU office for BP check 5-6 days.  II 03/05/2021, 8:06 AM

## 2021-03-05 NOTE — Plan of Care (Signed)
  Problem: Pain Managment: Goal: General experience of comfort will improve Outcome: Completed/Met   Problem: Education: Goal: Knowledge of disease or condition will improve Outcome: Completed/Met Goal: Knowledge of the prescribed therapeutic regimen will improve Outcome: Completed/Met

## 2021-03-05 NOTE — Lactation Note (Signed)
This note was copied from a baby's chart. Lactation Consultation Note  Patient Name: Girl Kayse Puccini [id number] Date: 03/05/2021 Reason for consult: Follow-up assessment;1st time breastfeeding;Primapara;NICU baby;Preterm <34wks;Infant < 6lbs Age:30 years  Visited with mom of 28 5/7 (adjusted) NICU female, she's a P1 and going home today. Reviewed discharge education, engorgement/sore nipples prevention and treatment and power pumping.   Plan of care:  Encouraged mom to continue pumping consistently every 2-3 hours, at least 8 pumping sessions/24 hours She'll take all her pump parts to baby's room She'll start doing power pumping in the AM whenever she misses her pumping session in the middle of the night  FOB present and supportive. All questions and concerns answered, parents aware of NICU LC services and will call PRN.  Maternal Data   Mom's milk supply is BNL, but her milk continues to come in  Feeding Mother's Current Feeding Choice: Breast Milk and Donor Milk  Lactation Tools Discussed/Used Tools: Pump;Flanges;Coconut oil Flange Size: 24 Breast pump type: Double-Electric Breast Pump Pump Education: Setup, frequency, and cleaning;Milk Storage Reason for Pumping: pre-term infant in NICU Pumping frequency: 7-8 times/24 hours Pumped volume: 18 mL  Interventions Interventions: Breast feeding basics reviewed;DEBP;Education  Discharge Discharge Education: Engorgement and breast care Pump: DEBP;Personal (Spectra 2)  Consult Status Consult Status: Follow-up Follow-up type: In-patient   Alesandra Smart Venetia Constable 03/05/2021, 8:45 AM

## 2021-03-05 NOTE — Discharge Summary (Signed)
Postpartum Discharge Summary  Date of Service updated 03/05/21     Patient Name: Katherine [name] DOB: 01/11/1991 MRN: [id number]  Date of admission: 02/24/2021 Delivery date:03/01/2021  Delivering provider: Eyvonne Mechanic A  Date of discharge: 03/05/2021  Admitting diagnosis: IUGR (intrauterine growth restriction) affecting care of mother [O36.5990] Preeclampsia, severe [O14.10] Intrauterine pregnancy: [redacted]w[redacted]d    Secondary diagnosis:  Active Problems:   IUGR (intrauterine growth restriction) affecting care of mother   Preeclampsia, severe  Additional problems:     Discharge diagnosis: Preterm Pregnancy Delivered and Preeclampsia (severe)                                              Post partum procedures: Augmentation: N/A Complications: None  Hospital course: Sceduled C/S   30y.o. yo G2P0111 at 239w1das admitted to the hospital 02/24/2021 for scheduled cesarean section with the following indication: severe preeclampsia at 28 1/7 wks .Delivery details are as follows:  Membrane Rupture Time/Date: 4:57 PM ,03/01/2021   Delivery Method:C-Section, Low Transverse  Details of operation can be found in separate operative note.  Patient had an uncomplicated postpartum course.  She is ambulating, tolerating a regular diet, passing flatus, and urinating well. Patient is discharged home in stable condition on  03/05/21        Newborn Data: Birth date:03/01/2021  Birth time:4:58 PM  Gender:Female  Living status:Living  Apgars:7 ,9  Weight:880 g     Magnesium Sulfate received: Yes: Neuroprotection BMZ received: Yes Rhophylac:No MMR:No T-DaP: Flu: No Transfusion:No  Physical exam  Vitals:   03/04/21 1925 03/05/21 0041 03/05/21 0423 03/05/21 0500  BP: (!) 143/96 134/89 138/89   Pulse: (!) 105 86 88   Resp: 20 18 (!) 97   Temp:  98.3 F (36.8 C) 97.9 F (36.6 C)   TempSrc:  Oral Oral   SpO2: 99% 100% 97%   Weight:    102.8 kg  Height:       General: alert, cooperative,  and no distress Lochia: appropriate Uterine Fundus: firm Incision: Healing well with no significant drainage DVT Evaluation: No evidence of DVT seen on physical exam. Labs: Lab Results  Component Value Date   WBC 17.3 (H) 03/02/2021   HGB 12.0 03/02/2021   HCT 36.4 03/02/2021   MCV 92.4 03/02/2021   PLT 220 03/02/2021   CMP Latest Ref Rng & Units 03/02/2021  Glucose 70 - 99 mg/dL 123(H)  BUN 6 - 20 mg/dL 13  Creatinine 0.44 - 1.00 mg/dL 0.86  Sodium 135 - 145 mmol/L 135  Potassium 3.5 - 5.1 mmol/L 5.3(H)  Chloride 98 - 111 mmol/L 103  CO2 22 - 32 mmol/L 25  Calcium 8.9 - 10.3 mg/dL 6.9(L)  Total Protein 6.5 - 8.1 g/dL 5.4(L)  Total Bilirubin 0.3 - 1.2 mg/dL 0.6  Alkaline Phos 38 - 126 U/L 69  AST 15 - 41 U/L 24  ALT 0 - 44 U/L 29   Edinburgh Score: Edinburgh Postnatal Depression Scale Screening Tool 03/03/2021  I have been able to laugh and see the funny side of things. 0  I have looked forward with enjoyment to things. 0  I have blamed myself unnecessarily when things went wrong. 1  I have been anxious or worried for no good reason. 0  I have felt scared or panicky for no good reason. 1  Things  have been getting on top of me. 1  I have been so unhappy that I have had difficulty sleeping. 2  I have felt sad or miserable. 1  I have been so unhappy that I have been crying. 1  The thought of harming myself has occurred to me. 0  Edinburgh Postnatal Depression Scale Total 7      After visit meds:  Allergies as of 03/05/2021       Reactions   Prednisone Rash, Other (See Comments)   Pt states "It makes me feel weird.        Medication List     STOP taking these medications    aspirin EC 81 MG tablet   Magnesium 100 MG Caps   QC TUMERIC COMPLEX PO   UNABLE TO FIND   Zinc 50 MG Caps       TAKE these medications    acetaminophen 325 MG tablet Commonly known as: TYLENOL Take 2 tablets (650 mg total) by mouth every 6 (six) hours as needed for mild pain  (temperature > 101.5.).   labetalol 200 MG tablet Commonly known as: NORMODYNE Take 1 tablet (200 mg total) by mouth 3 (three) times daily.   NIFEdipine 60 MG 24 hr tablet Commonly known as: ADALAT CC Take 1 tablet (60 mg total) by mouth 2 (two) times daily.   oxyCODONE 5 MG immediate release tablet Commonly known as: Oxy IR/ROXICODONE Take 1 tablet (5 mg total) by mouth every 6 (six) hours as needed for moderate pain.   PRENATAL ADULT GUMMY/DHA/FA PO Take 2 tablets by mouth at bedtime. Gummies   sertraline 100 MG tablet Commonly known as: ZOLOFT Take 1 tablet (100 mg total) by mouth at bedtime. What changed: Another medication with the same name was removed. Continue taking this medication, and follow the directions you see here.         Discharge home in stable condition Infant Feeding: Breast Infant Disposition:NICU Discharge instruction: per After Visit Summary and Postpartum booklet. Activity: Advance as tolerated. Pelvic rest for 6 weeks.  Diet: routine diet Anticipated Birth Control: Unsure Postpartum Appointment:6 weeks Additional Postpartum F/U: BP check 1 week Future Appointments:No future appointments. Follow up Visit:      03/05/2021 Allena Katz, MD

## 2021-03-06 ENCOUNTER — Ambulatory Visit: Payer: Self-pay

## 2021-03-06 NOTE — Lactation Note (Signed)
This note was copied from a baby's chart. Lactation Consultation Note Mom continues to pump frequently and with normal output. She denies breast pain and has no s/s of engorgement on day 5pp.  Patient Name: [name] JJHER'D Date: 03/06/2021 Reason for consult: Follow-up assessment Age:31 days  Maternal Data  Pumping frequency: q3 Pumped volume: 50 mL  Feeding Mother's Current Feeding Choice: Breast Milk   Interventions Interventions: Education  Consult Status Consult Status: Follow-up Date: 03/06/21 Follow-up type: In-patient   [name] 03/06/2021, 3:13 PM

## 2021-03-09 LAB — SURGICAL PATHOLOGY

## 2021-03-13 ENCOUNTER — Telehealth (HOSPITAL_COMMUNITY): Payer: Self-pay

## 2021-03-13 ENCOUNTER — Ambulatory Visit: Payer: Self-pay

## 2021-03-13 NOTE — Lactation Note (Signed)
This note was copied from a baby's chart. Lactation Consultation Note  Patient Name: Katherine [name] BOFBP'Z Date: 03/13/2021 Reason for consult: Follow-up assessment;1st time breastfeeding;Primapara;NICU baby;Preterm <34wks;Infant < 6lbs Age:31 days  Visited with mom of 33 days old pre-term NICU female, she was pumping when entered the room, praised her for her efforts.  Mom was concerned about her supply not increasing, review pumping schedule, supply/demand, lactogenesis III and power pumping.  Plan of care:  Encouraged mom to continue pumping consistently at least 8 times/24 hours She'll start power pumping in the AM to boost her supply  FOB present and supportive. All questions and concerns answered, parents aware of NICU LC services and will call PRN.  Maternal Data   Mom's milk supply is slightly BNL  Feeding Mother's Current Feeding Choice: Breast Milk  Lactation Tools Discussed/Used Tools: Pump;Flanges Flange Size: 24 Breast pump type: Double-Electric Breast Pump Pump Education: Setup, frequency, and cleaning;Milk Storage Reason for Pumping: pre-term infant in NICU Pumping frequency: 7 times/24 hours Pumped volume: 40 mL (40-60 ml)  Interventions Interventions: Breast feeding basics reviewed;DEBP;Education  Discharge Pump: DEBP;Personal (Spectra 2 at home)  Consult Status Consult Status: Follow-up Date: 03/13/21 Follow-up type: In-patient  Katherine [name] 03/13/2021, 2:32 PM

## 2021-03-13 NOTE — Telephone Encounter (Signed)
  No answer. Left message to return nurse call.  Marcelino [facility] 03/13/2021,1423

## 2021-03-17 ENCOUNTER — Ambulatory Visit: Payer: Self-pay

## 2021-03-17 NOTE — Lactation Note (Signed)
This note was copied from a baby's chart. Lactation Consultation Note Mother continues to pump frequently and with normal milk volume.   Patient Name: Katherine [name] PEJYL'T Date: 03/17/2021 Reason for consult: Follow-up assessment Age:31 wk.o.  Maternal Data  Pumping frequency: 7xday Pumped volume: 60 mL  Feeding Mother's Current Feeding Choice: Breast Milk   Interventions Interventions: Education   Consult Status Consult Status: Follow-up Date: 03/17/21 Follow-up type: In-patient   [name] 03/17/2021, 5:12 PM

## 2021-03-18 DIAGNOSIS — R609 Edema, unspecified: Secondary | ICD-10-CM | POA: Diagnosis not present

## 2021-03-20 ENCOUNTER — Ambulatory Visit: Payer: Self-pay

## 2021-03-20 NOTE — Lactation Note (Signed)
This note was copied from a baby's chart. Lactation Consultation Note  Patient Name: Katherine [name] KWIOX'B Date: 03/20/2021 Reason for consult: Follow-up assessment;1st time breastfeeding;Primapara;NICU baby;Mother's request;Preterm <34wks;Infant < 6lbs Age:31 wk.o.  FOB stopped LC when charting and asked to visit mom because she had a question. LC visited with mom of 49 25/11 weeks old (adjusted) NICU female, she's a P1 and had questions regarding nipple pain on her right breast.   Mom voiced that only the nipple area is hurting, noticed some areolar edema on that side (none of the left one) breast were soft without s/s of engorgement. Assisted mom with ice packs and breast shells to treat the swelling on the nipple/areola complex.   She reports pumping is going well and that her affected side is the one that actually yields the most milk. Reviewed breast care and benefits of STS, mom had baby inside her bra when entering the room.  Plan of care:   Encouraged mom to continue pumping consistently at least 8 times/24 hours She'll start power pumping in the AM to boost her supply She'll apply ice and wear the breast shells on her right side until symptoms subside    All questions and concerns answered, mom aware of NICU LC services and will call PRN.  Maternal Data   Mom's milk supply is slightly BNL but she's working on it  Feeding Mother's Current Feeding Choice: Breast Milk  Lactation Tools Discussed/Used Tools: Pump;Flanges;Shells;Coconut oil Flange Size: 24 Breast pump type: Double-Electric Breast Pump Pump Education: Setup, frequency, and cleaning;Milk Storage Reason for Pumping: pre-term infant in NICU Pumping frequency: 7 times/24 hours Pumped volume: 60 mL  Interventions Interventions: Breast feeding basics reviewed;DEBP;Education;Ice;Shells;Coconut oil  Discharge Discharge Education: Engorgement and breast care Pump: DEBP;Personal (Spectra 2 at home)  Consult  Status Consult Status: Follow-up Date: 03/20/21 Follow-up type: In-patient  Katherine [name] 03/20/2021, 5:17 PM

## 2021-03-23 IMAGING — US US SOFT TISSUE HEAD/NECK
1 series · 14 of 20 positions shown · non-contrast
Comparison: No pertinent prior exams available for comparison.

CLINICAL DATA: Posterior auricular lymphadenopathy. Patient has 2
presumed lymph nodes on the right side of her neck-7 behind her ear
and 1 lower on her neck.

EXAM:
ULTRASOUND OF HEAD/NECK SOFT TISSUES
TECHNIQUE: Ultrasound examination of the head and neck soft tissues was
performed in the area of clinical concern.

[Series 1: us soft tissue head/neck · 14 of 20 slices shown]
[im 1/20]
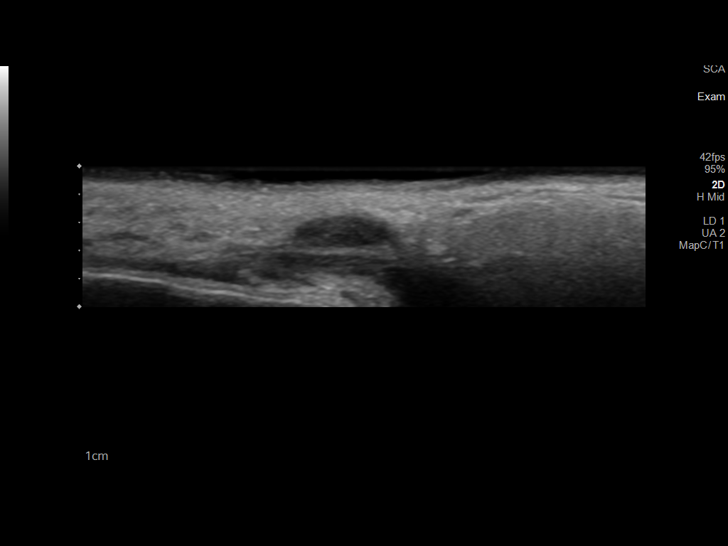
[im 3/20]
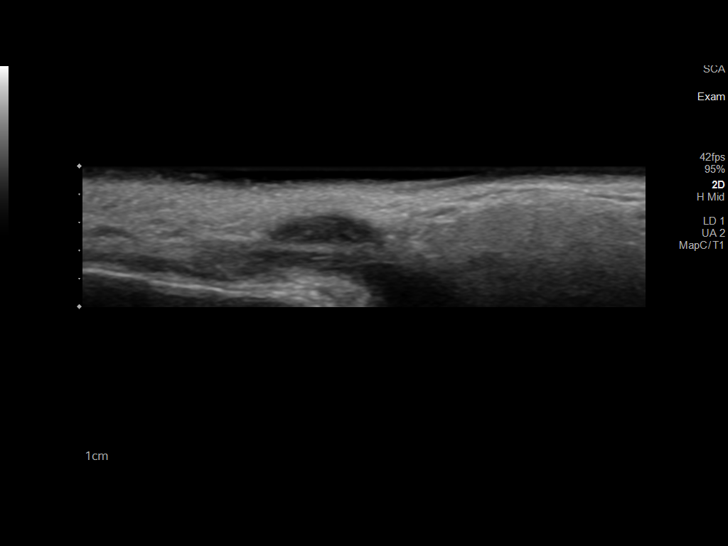
[im 4/20]
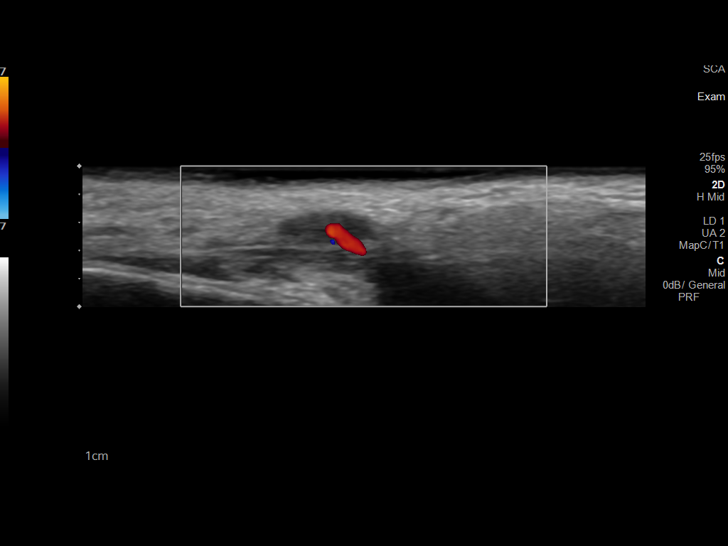
[im 6/20]
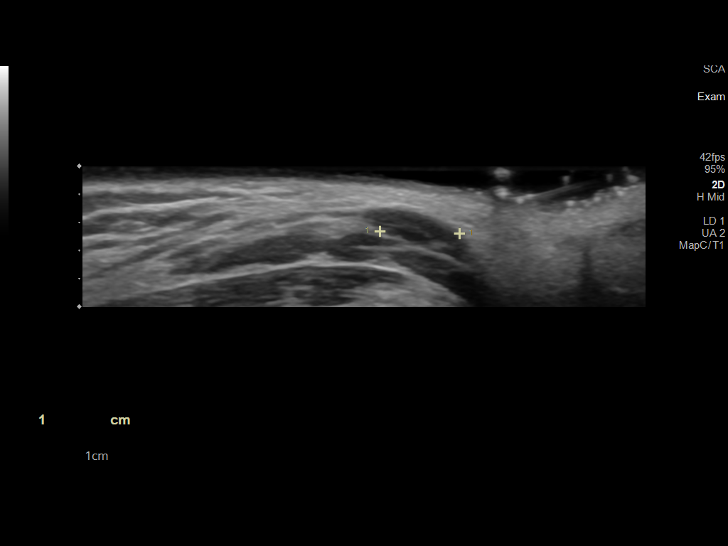
[im 7/20]
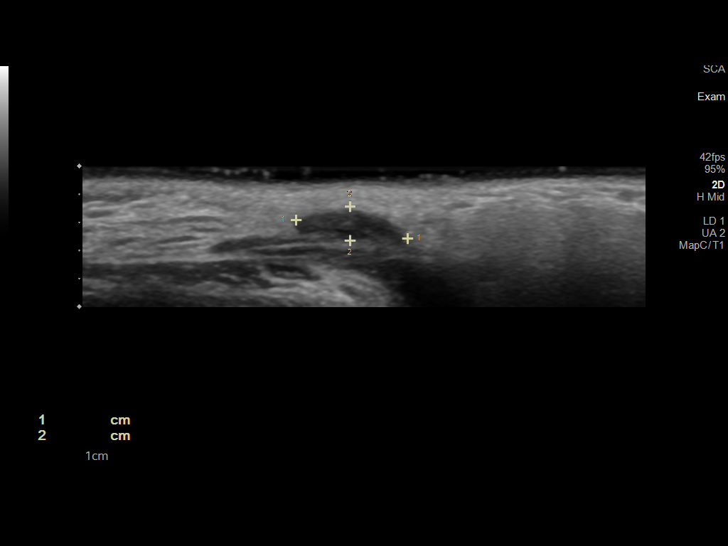
[im 8/20]
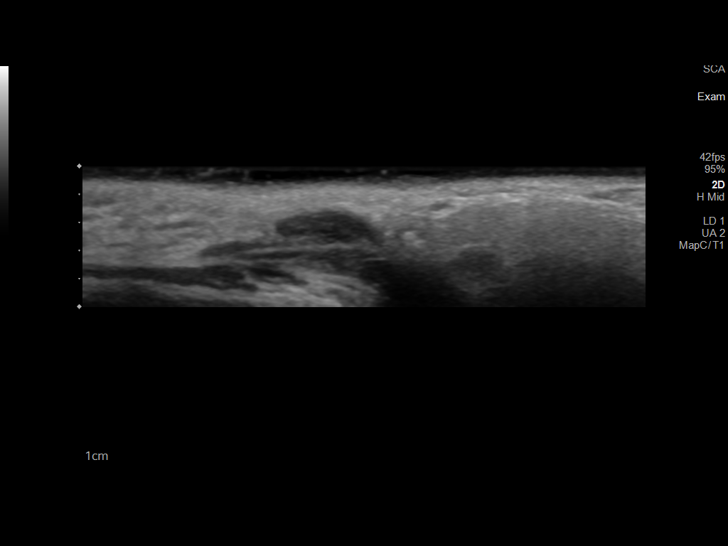
[im 10/20]
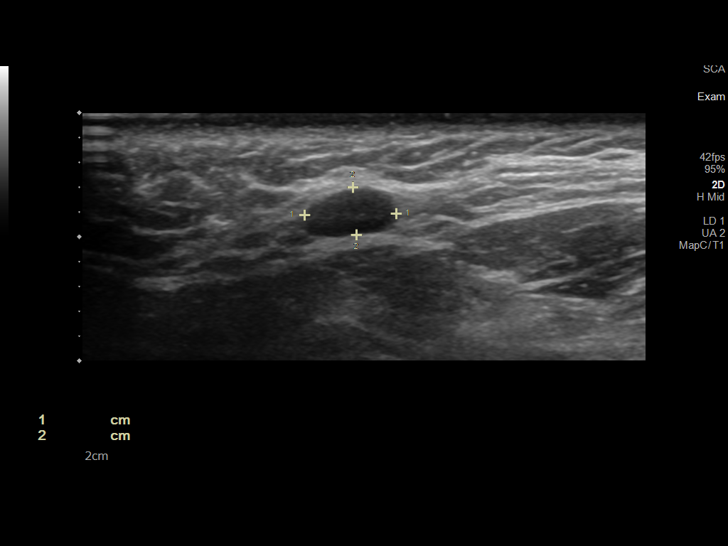
[im 11/20]
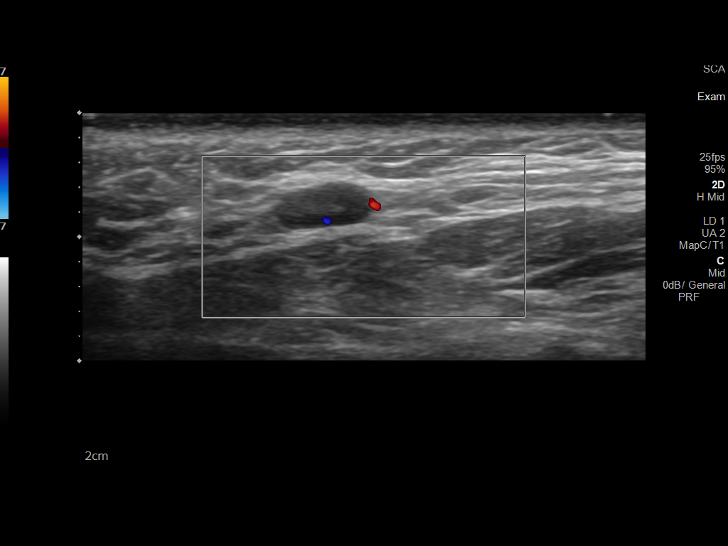
[im 13/20]
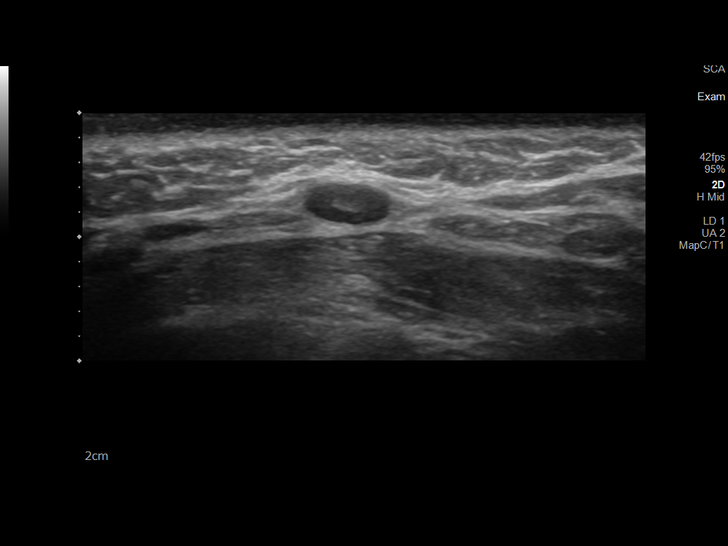
[im 14/20]
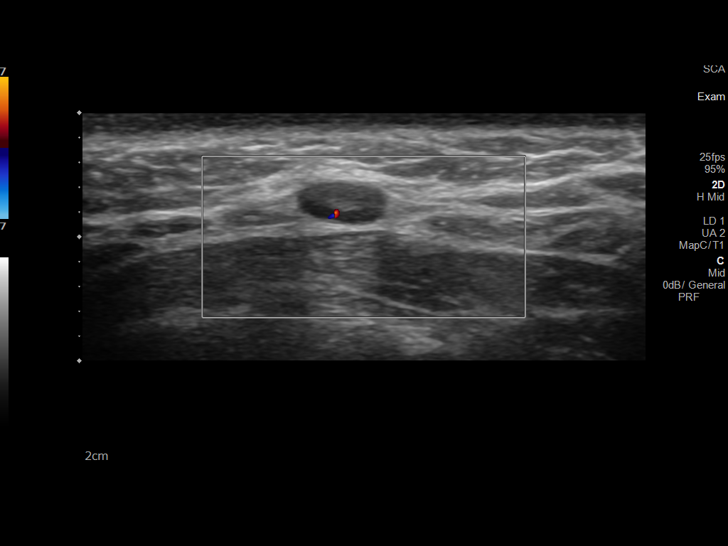
[im 16/20]
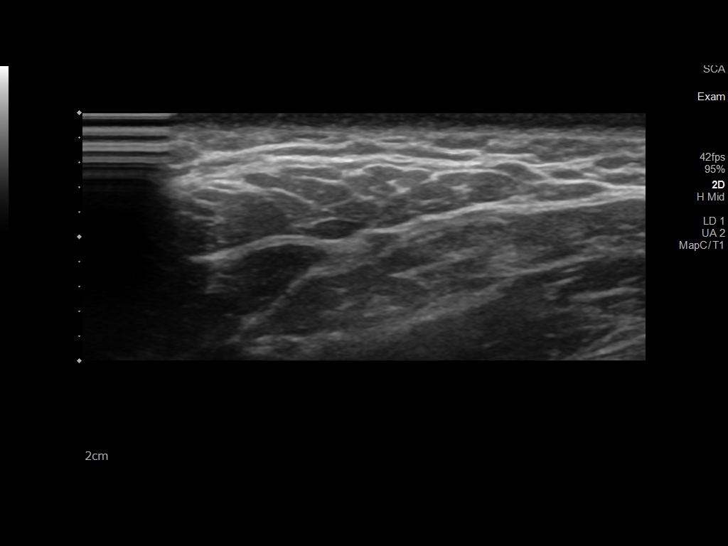
[im 17/20]
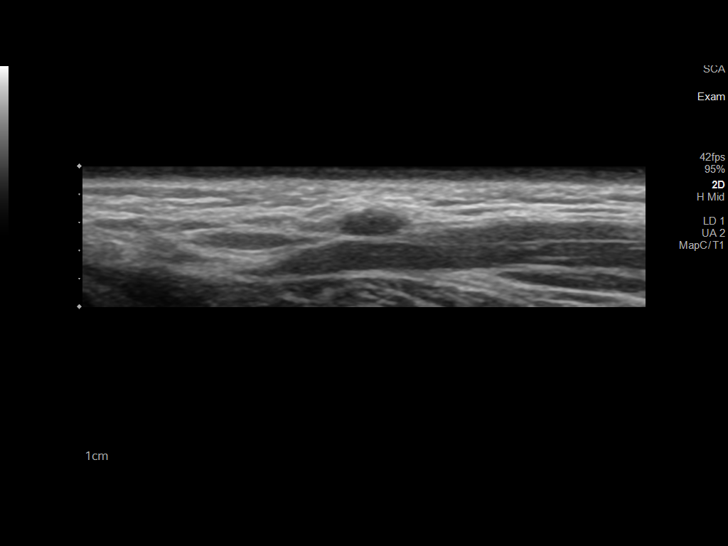
[im 18/20]
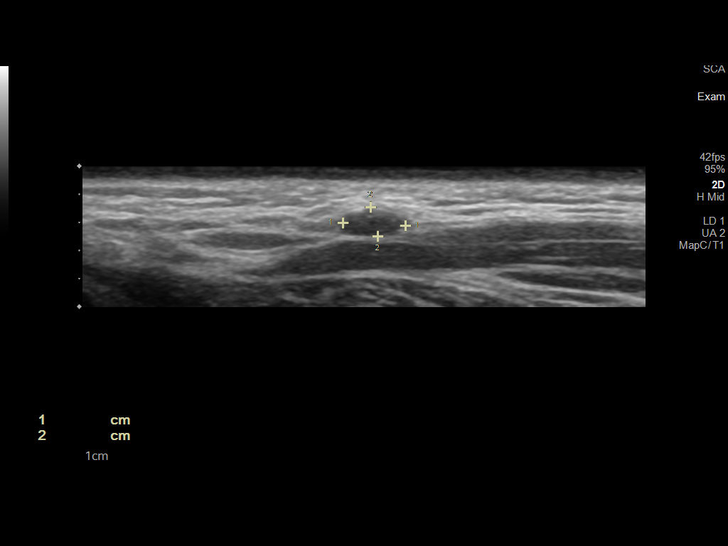
[im 20/20]
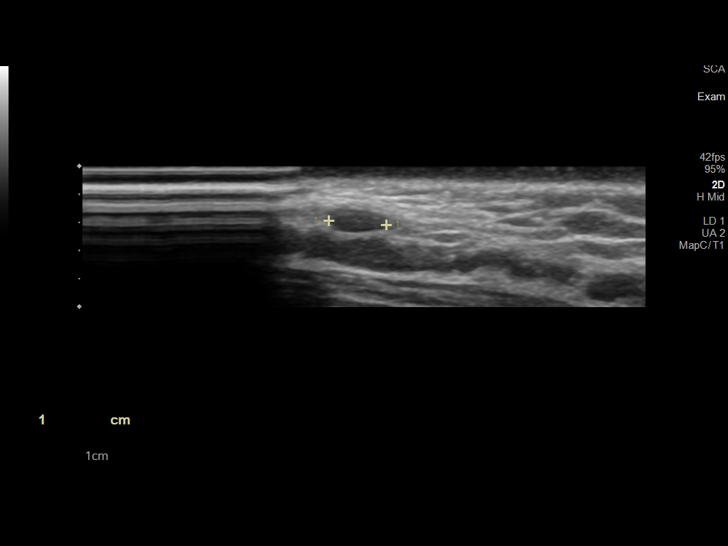

[14 of 20 positions shown; findings below may reference images not displayed]

FINDINGS: Targeted ultrasound was performed in the right postauricular region
of concern. At this site, there is an ovoid hypoechoic focus
measuring 0.8 x 0.3 x 0.6 cm. There is associated central
hyperechogenicity consistent with a fatty hilum and overall this
finding is consistent with a lymph node. At the second location of
concern within the upper right neck, there is a lymph node measuring
0.7 x 0.4 x 0.8 cm. At the third location of concern posterior to
the left ear, there is a lymph node measuring 0.5 x 0.2 x 0.4 cm.
IMPRESSION: Targeted ultrasound performed in the bilateral head/neck regions of
concern. Small lymph nodes are noted as these sites measuring up to
7 mm in short axis, as described.

## 2021-03-24 ENCOUNTER — Ambulatory Visit: Payer: Self-pay

## 2021-03-24 NOTE — Lactation Note (Signed)
This note was copied from a baby's chart. Lactation Consultation Note Mother continues to pump frequently and with a milk supply that is increasing. Her overall supply is a bit low but trending upward. We discussed strategies to decrease R breast pain.Will begin lick and learn when baby is developmentally ready.  Patient Name: Katherine [name] HYQMV'H Date: 03/24/2021 Reason for consult: Follow-up assessment Age:31 wk.o.  Maternal Data  Pumping frequency: 7x day with 60+mL per session R breast pain  Feeding Mother's Current Feeding Choice: Breast Milk   Interventions Interventions: Education   Consult Status Consult Status: Follow-up Date: 03/24/21 Follow-up type: In-patient   [name] 03/24/2021, 5:46 PM

## 2021-04-02 ENCOUNTER — Ambulatory Visit: Payer: Self-pay

## 2021-04-02 NOTE — Lactation Note (Signed)
This note was copied from a baby's chart. Lactation Consultation Note  Patient Name: Katherine [name] ASNKN'L Date: 04/02/2021 Reason for consult: Follow-up assessment;Primapara;1st time breastfeeding;NICU baby;Preterm <34wks Age:31 wk.o.  Visited with mom of 95 85/76 weeks old (adjusted) NICU female, she reports that the breast pain she had last week in gone, LC noted that the swelling has also resolved, mom voiced that the ice/heat treatments helped, she also worked on adjusting the suction of her home pumps (less suction, more input) and also switched her spectra flange sizes from # 28 to # 24.  Pumping volumes have also improved, mom is still working on power pumping to get her supply WNL; praised her for her efforts. She keeps working on suck training with baby with pacifier, she'll also try pinky finger with a drop of EBM.   Plan of care:   Encouraged mom to continue pumping consistently at least 8 times/24 hours She'll start power pumping in the AM to boost her supply   All questions and concerns answered, mom aware of NICU LC services and will call PRN.  Maternal Data   Mom's supply has improved and is almost WNL  Feeding Mother's Current Feeding Choice: Breast Milk   Lactation Tools Discussed/Used Tools: Pump;Flanges Flange Size: 24 Breast pump type: Double-Electric Breast Pump Pump Education: Setup, frequency, and cleaning;Milk Storage Reason for Pumping: pre-term infant in NICU Pumping frequency: 7-8 times/24 hours Pumped volume: 90 mL (90-120 ml)  Discharge Discharge Education: Engorgement and breast care Pump: DEBP;Personal (Spectra 2 and also wearable pump at home)  Consult Status Consult Status: Follow-up Date: 04/02/21 Follow-up type: In-patient   Katherine [name] 04/02/2021, 5:51 PM

## 2021-04-03 ENCOUNTER — Ambulatory Visit: Payer: Self-pay

## 2021-04-03 NOTE — Lactation Note (Signed)
This note was copied from a baby's chart. Lactation Consultation Note LC to room at mom's request to review milk production progress. Mother is concerned about overall milk volume. She is pumping frequently and adding breast massage to maximize yield. I assured mother that she has a well established milk supply that will likely trend upward when her baby begins direct breastfeeding. I encouraged her to continue her current plan. We also discussed milk composition in preterm milk and calorie-rich milk that often occurs in mothers who synthesize at the lower-normal end. Mother reports feeling more confident after our discussion.  She anticipates IDF-1 (lick and learn) when [name] is ready.  Patient Name: Girl Corbyn Steedman TGGYI'R Date: 04/03/2021 Reason for consult: Mother's request Age:82 wk.o.  Maternal Data  Pumping frequency: 7x day. Volume varies from 60-112mL per pumping  Feeding Mother's Current Feeding Choice: Breast Milk  Interventions Interventions: Education  Consult Status Consult Status: Follow-up Date: 04/03/21 Follow-up type: In-patient   [name] 04/03/2021, 2:51 PM

## 2021-04-07 ENCOUNTER — Ambulatory Visit: Payer: Self-pay

## 2021-04-07 NOTE — Lactation Note (Signed)
This note was copied from a baby's chart. Lactation Consultation Note Mother continues to pump frequently. Her milk supply continues to trend up and is wnl. Baby is beginning lick & learn. Will plan return visit to assist prn.  Patient Name: Katherine [name] OIZTI'W Date: 04/07/2021 Reason for consult: Follow-up assessment Age:31 wk.o.  Maternal Data  Pumping frequency: q3 average  Feeding Mother's Current Feeding Choice: Breast Milk  Interventions Interventions: Education  Consult Status Consult Status: Follow-up Date: 04/07/21 Follow-up type: In-patient   [name] 04/07/2021, 10:38 AM

## 2021-04-12 ENCOUNTER — Ambulatory Visit: Payer: Self-pay

## 2021-04-12 NOTE — Lactation Note (Signed)
This note was copied from a baby's chart. Lactation Consultation Note Mother's milk supply continues to increase with frequent pumpings. She and [name] are beginning lick & learn br'ing. Reviewed feeding readiness at 34+ weeks and norms for age. Mother to continue pre-pumping. Will plan return visit on Wednesday at 1200 to assist.  Patient Name: Katherine [name] KGURK'Y Date: 04/12/2021 Reason for consult: Follow-up assessment;Weekly NICU follow-up Age:45 wk.o.  Maternal Data  Pumping frequency: 510-570 ml per day with 7 pumpings  Feeding Mother's Current Feeding Choice: Breast Milk  Interventions Interventions: Infant Driven Feeding Algorithm education;Education;Breast feeding basics reviewed;Position options   Consult Status Consult Status: Follow-up Date: 04/12/21   [name] 04/12/2021, 4:11 PM

## 2021-04-14 ENCOUNTER — Ambulatory Visit: Payer: Self-pay

## 2021-04-14 NOTE — Lactation Note (Signed)
This note was copied from a baby's chart. Lactation Consultation Note LC presented to assist with lick & learn breastfeeding. Infant placed at pre-pumped breast and lightly pestered. She did not wake or demonstrate feeding cues. We reviewed normalcy of her behavior. LC will plan return visit to further assist.   Patient Name: Katherine [name] [id number]'U Date: 04/14/2021 Reason for consult: Follow-up assessment;Breastfeeding assistance Age:31 wk.o.  Feeding Mother's Current Feeding Choice: Breast Milk  LATCH Score Latch: Too sleepy or reluctant, no latch achieved, no sucking elicited.  Audible Swallowing: None  Type of Nipple: Everted at rest and after stimulation  Comfort (Breast/Nipple): Soft / non-tender  Hold (Positioning): Assistance needed to correctly position infant at breast and maintain latch.  LATCH Score: 5   Interventions Interventions: Education  Consult Status Consult Status: Follow-up Date: 04/14/21 Follow-up type: In-patient   [name] 04/14/2021, 12:55 PM

## 2021-04-19 ENCOUNTER — Ambulatory Visit: Payer: Self-pay

## 2021-04-19 NOTE — Lactation Note (Signed)
This note was copied from a baby's chart.  NICU Lactation Consultation Note  Patient Name: Girl Doreatha Offer BJSEG'B Date: 04/19/2021 Age:31 wk.o.   Subjective Reason for consult: Weekly NICU follow-up Mother continues to pump frequently. She is pleased that baby is waking more consistently. Mother and baby are practicing bf'ing. Mother has increasing comfort positioning baby. She is most comfortable with football positioning. Mother is normally here for 12,3,and 6pm feeding times.  Mother is concerned about flange size. She requested 11mm flanges.   Objective Infant data: Mother's Current Feeding Choice: Breast Milk  Infant feeding assessment Scale for Readiness: 3   Maternal data: G2P0111  C-Section, Low Transverse   Pumping frequency: q3 60+ mls  Assessment Infant:  Maternal: Mother's milk supply remains low-normal. Her supply will likely improve with baby's increasing time at breast.   Intervention/Plan Interventions: Education Provided 80mm flanges at mom's request   Plan: Consult Status: Follow-up  NICU Follow-up type: Weekly NICU follow up  Mother to have [facility] paged for next pumping. LC will return to assess flange size.   [name] 04/19/2021, 2:25 PM

## 2021-04-22 ENCOUNTER — Encounter: Payer: Self-pay | Admitting: Nurse Practitioner

## 2021-04-22 ENCOUNTER — Other Ambulatory Visit: Payer: Self-pay

## 2021-04-22 ENCOUNTER — Ambulatory Visit (INDEPENDENT_AMBULATORY_CARE_PROVIDER_SITE_OTHER): Payer: BC Managed Care – PPO | Admitting: Nurse Practitioner

## 2021-04-22 VITALS — BP 122/78 | HR 92 | Temp 98.0°F | Resp 14 | Ht 67.0 in | Wt 225.0 lb

## 2021-04-22 DIAGNOSIS — J029 Acute pharyngitis, unspecified: Secondary | ICD-10-CM | POA: Diagnosis not present

## 2021-04-22 DIAGNOSIS — R0981 Nasal congestion: Secondary | ICD-10-CM | POA: Diagnosis not present

## 2021-04-22 DIAGNOSIS — J3489 Other specified disorders of nose and nasal sinuses: Secondary | ICD-10-CM | POA: Diagnosis not present

## 2021-04-22 LAB — POCT RAPID STREP A (OFFICE): Rapid Strep A Screen: NEGATIVE

## 2021-04-22 MED ORDER — FLUTICASONE PROPIONATE 50 MCG/ACT NA SUSP: 2.0000 | Freq: Every day | NASAL | 0 refills | Status: DC

## 2021-04-22 MED ORDER — LORATADINE 10 MG PO TABS: 10.0000 mg | ORAL_TABLET | Freq: Every day | ORAL | 0 refills | Status: DC

## 2021-04-22 NOTE — Assessment & Plan Note (Signed)
Hx of sinus infections. Concerned because her baby is in NICU and she "doesn't have time to be sick". Did discuss that most sinus infections are viral and start resolving around the week mark. We will watchful wait and follow back up. She is not immune compromised. She is breast feeding also

## 2021-04-22 NOTE — Patient Instructions (Signed)
We will hold off on antibiotics currently Let me know if you are not improving by Tuesday or if you start running fever.

## 2021-04-22 NOTE — Assessment & Plan Note (Signed)
Patient was around family members that were diagnosed with strep.  Her throat started out sore then resolved. Her strep test was negative in office. Continue symptomatic treatment

## 2021-04-22 NOTE — Progress Notes (Signed)
Acute Office Visit  Subjective:    Patient ID: Katherine [name], female    DOB: 1991/04/06, 30 y.o.   MRN: [id number]  Chief Complaint  Patient presents with   Sore Throat    Sx started on 04/20/21, sinus pressure, blowing out green mucus, runny nose, slight cough-dry. No fever. Patient was around her nephew and her sister on 04/18/21 and they both have strep. Covid test at home was negative on 04/22/21     Patient is in today for  Symptoms started Tuesday evening  States took 3 doses of amoxcillin 875 twice a day for a couple days Has tried mucinex and ibuprofen with minimal relief .  Breast feeding currently Felt ok Wednesday.   Past Medical History:  Diagnosis Date   Anxiety    Asthma    Depression    Elevated blood pressure reading    Frequent headaches    Recurrent UTI     Past Surgical History:  Procedure Laterality Date   CESAREAN SECTION N/A 03/01/2021   Procedure: CESAREAN SECTION;  Surgeon: Lyn Henri, MD;  Location: [facility];  Service: Obstetrics;  Laterality: N/A;   DILATION AND EVACUATION N/A 02/11/2020   Procedure: DILATATION AND EVACUATION;  Surgeon: Mitchel Honour, DO;  Location: [facility] OR;  Service: Gynecology;  Laterality: N/A;    Family History  Problem Relation Age of Onset   Hypertension Father    Hypertension Brother     Social History   Socioeconomic History   Marital status: Married    Spouse name: Not on file   Number of children: Not on file   Years of education: Not on file   Highest education level: Not on file  Occupational History   Occupation: CSR  Tobacco Use   Smoking status: Never   Smokeless tobacco: Never  Vaping Use   Vaping Use: Never used  Substance and Sexual Activity   Alcohol use: Yes    Alcohol/week: 2.0 - 3.0 standard drinks    Types: 2 - 3 Standard drinks or equivalent per week    Comment: occassionally   Drug use: No   Sexual activity: Yes    Birth control/protection: None  Other Topics Concern    Not on file  Social History Narrative   Not on file   Social Determinants of Health   Financial Resource Strain: Not on file  Food Insecurity: Not on file  Transportation Needs: Not on file  Physical Activity: Not on file  Stress: Not on file  Social Connections: Not on file  Intimate Partner Violence: Not on file    Outpatient Medications Prior to Visit  Medication Sig Dispense Refill   NIFEdipine (ADALAT CC) 60 MG 24 hr tablet Take 1 tablet (60 mg total) by mouth 2 (two) times daily. 60 tablet 1   Prenatal MV & Min w/FA-DHA (PRENATAL ADULT GUMMY/DHA/FA PO) Take 2 tablets by mouth at bedtime. Gummies     sertraline (ZOLOFT) 100 MG tablet Take 1 tablet (100 mg total) by mouth at bedtime. 90 tablet 3   acetaminophen (TYLENOL) 325 MG tablet Take 2 tablets (650 mg total) by mouth every 6 (six) hours as needed for mild pain (temperature > 101.5.). 60 tablet 0   labetalol (NORMODYNE) 200 MG tablet Take 1 tablet (200 mg total) by mouth 3 (three) times daily. 90 tablet 1   oxyCODONE (OXY IR/ROXICODONE) 5 MG immediate release tablet Take 1 tablet (5 mg total) by mouth every 6 (six) hours as needed  for moderate pain. 20 tablet 0   No facility-administered medications prior to visit.    Allergies  Allergen Reactions   Prednisone Rash and Other (See Comments)    Pt states "It makes me feel weird.    Review of Systems  Constitutional:  Negative for chills, fatigue and fever.  HENT:  Positive for rhinorrhea, sinus pressure and sore throat (was sore but has not been today). Negative for congestion.   Respiratory:  Negative for cough and shortness of breath.   Cardiovascular:  Negative for chest pain.  Gastrointestinal:  Negative for diarrhea, nausea and vomiting.  Musculoskeletal:  Negative for arthralgias and myalgias.  Neurological:  Negative for headaches.      Objective:    Physical Exam Vitals and nursing note reviewed.  Constitutional:      Appearance: She is  well-developed.  HENT:     Right Ear: Tympanic membrane and ear canal normal.     Left Ear: Tympanic membrane and ear canal normal.     Nose:     Left Sinus: Maxillary sinus tenderness present.     Mouth/Throat:     Mouth: Mucous membranes are moist.     Pharynx: Posterior oropharyngeal erythema present. No pharyngeal swelling.  Eyes:     Conjunctiva/sclera: Conjunctivae normal.  Neck:   Cardiovascular:     Rate and Rhythm: Normal rate and regular rhythm.  Pulmonary:     Effort: Pulmonary effort is normal.     Breath sounds: Normal breath sounds.  Lymphadenopathy:     Cervical: Cervical adenopathy present.  Skin:    General: Skin is warm.  Neurological:     Mental Status: She is alert.  Psychiatric:        Mood and Affect: Mood normal.        Behavior: Behavior normal.        Thought Content: Thought content normal.        Judgment: Judgment normal.    BP 122/78   Pulse 92   Temp 98 F (36.7 C)   Resp 14   Ht 5\' 7"  (1.702 m)   Wt 225 lb (102.1 kg)   LMP 08/16/2020 Comment: no menstrual cycle still 04/22/21  SpO2 98%   Breastfeeding Yes   BMI 35.24 kg/m  Wt Readings from Last 3 Encounters:  04/22/21 225 lb (102.1 kg)  03/05/21 226 lb 9.6 oz (102.8 kg)  02/22/21 236 lb 1.6 oz (107.1 kg)    Health Maintenance Due  Topic Date Due   COVID-19 Vaccine (1) Never done   HIV Screening  Never done   Hepatitis C Screening  Never done   PAP SMEAR-Modifier  02/16/2015   INFLUENZA VACCINE  Never done    There are no preventive care reminders to display for this patient.   Lab Results  Component Value Date   TSH 2.10 04/27/2020   Lab Results  Component Value Date   WBC 17.3 (H) 03/02/2021   HGB 12.0 03/02/2021   HCT 36.4 03/02/2021   MCV 92.4 03/02/2021   PLT 220 03/02/2021   Lab Results  Component Value Date   NA 135 03/02/2021   K 5.3 (H) 03/02/2021   CO2 25 03/02/2021   GLUCOSE 123 (H) 03/02/2021   BUN 13 03/02/2021   CREATININE 0.86 03/02/2021    BILITOT 0.6 03/02/2021   ALKPHOS 69 03/02/2021   AST 24 03/02/2021   ALT 29 03/02/2021   PROT 5.4 (L) 03/02/2021   ALBUMIN 2.5 (L) 03/02/2021  CALCIUM 6.9 (L) 03/02/2021   ANIONGAP 7 03/02/2021   GFR 104.84 06/02/2017   Lab Results  Component Value Date   CHOL 167 04/27/2020   Lab Results  Component Value Date   HDL 50 04/27/2020   Lab Results  Component Value Date   LDLCALC 96 04/27/2020   Lab Results  Component Value Date   TRIG 113 04/27/2020   Lab Results  Component Value Date   CHOLHDL 3.3 04/27/2020   Lab Results  Component Value Date   HGBA1C 5.1 04/27/2020       Assessment & Plan:   Problem List Items Addressed This Visit       Other   Sore throat - Primary    Patient was around family members that were diagnosed with strep.  Her throat started out sore then resolved. Her strep test was negative in office. Continue symptomatic treatment      Relevant Orders   Rapid Strep A (Completed)   Nasal congestion    Recommended OTC supportative treatment. Will do flonase to help with symptoms. Continue to monitor.      Relevant Medications   fluticasone (FLONASE) 50 MCG/ACT nasal spray   Sinus pressure    Hx of sinus infections. Concerned because her baby is in NICU and she "doesn't have time to be sick". Did discuss that most sinus infections are viral and start resolving around the week mark. We will watchful wait and follow back up. She is not immune compromised. She is breast feeding also      Relevant Medications   fluticasone (FLONASE) 50 MCG/ACT nasal spray     No orders of the defined types were placed in this encounter.  This visit occurred during the SARS-CoV-2 public health emergency.  Safety protocols were in place, including screening questions prior to the visit, additional usage of staff PPE, and extensive cleaning of exam room while observing appropriate contact time as indicated for disinfecting solutions.    Audria Nine, NP

## 2021-04-22 NOTE — Assessment & Plan Note (Signed)
Recommended OTC supportative treatment. Will do flonase to help with symptoms. Continue to monitor.

## 2021-04-23 DIAGNOSIS — Z1389 Encounter for screening for other disorder: Secondary | ICD-10-CM | POA: Diagnosis not present

## 2021-04-25 ENCOUNTER — Ambulatory Visit: Payer: Self-pay

## 2021-04-25 NOTE — Lactation Note (Signed)
This note was copied from a baby's chart. Lactation Consultation Note  Patient Name: Katherine [name] Katherine [name] Date: 04/25/2021 Reason for consult: 1st time breastfeeding;Primapara;Follow-up assessment;NICU baby;Late-preterm 31-36.6wks;Infant < 6lbs Age:30 years  Visited with [name], she voices that pumping is going well and that the # 21 flanges didn't work for her, she's back to the # 24.  Baby has started going to breast, an average of 3 times/day for some lick and learn.  Baby has not taken bottles yet, mom would like to do the breast if possible. Reviewed IDF and advised mom to pump some prior feedings, she's aware that once SLP clears baby to have full volumes, she won't have to pre-pump.  Plan of care:   Encouraged mom to continue pumping consistently at least 8 times/24 hours She'll try power pumping in the AM to boost her supply She will continue taking baby to a pumped breast on feeding cues   All questions and concerns answered, mom aware of NICU LC services and will call PRN.  Maternal Data   Mom's supply is [employer] Mother's Current Feeding Choice: Breast Milk  Lactation Tools Discussed/Used Tools: Pump;Flanges Flange Size: 24 Breast pump type: Double-Electric Breast Pump Pump Education: Setup, frequency, and cleaning;Milk Storage Reason for Pumping: LPI in NICU Pumping frequency: 7 times/24 hours Pumped volume: 60 mL (60-120 ml; 500 ml in 24 hours per mom)  Interventions Interventions: DEBP;Education;Infant Driven Feeding Algorithm education  Discharge Pump: DEBP;Personal (Spectra 2 and wearable pump)  Consult Status Consult Status: Follow-up Date: 04/25/21 Follow-up type: In-patient   Katherine [name] 04/25/2021, 3:59 PM

## 2021-04-29 ENCOUNTER — Encounter: Payer: Self-pay | Admitting: Family Medicine

## 2021-04-30 ENCOUNTER — Telehealth: Payer: BC Managed Care – PPO | Admitting: Physician Assistant

## 2021-04-30 ENCOUNTER — Ambulatory Visit: Payer: Self-pay

## 2021-04-30 DIAGNOSIS — B9689 Other specified bacterial agents as the cause of diseases classified elsewhere: Secondary | ICD-10-CM

## 2021-04-30 DIAGNOSIS — J019 Acute sinusitis, unspecified: Secondary | ICD-10-CM

## 2021-04-30 MED ORDER — AMOXICILLIN-POT CLAVULANATE 875-125 MG PO TABS: 1.0000 | ORAL_TABLET | Freq: Two times a day (BID) | ORAL | 0 refills | Status: DC

## 2021-04-30 NOTE — Progress Notes (Signed)

## 2021-04-30 NOTE — Lactation Note (Signed)
This note was copied from a baby's chart.  NICU Lactation Consultation Note  Patient Name: Katherine [name] Date: 04/30/2021 Age:31 wk.o.   Subjective Reason for consult: Mother's request; Weekly NICU follow-up; Breastfeeding assistance Mother is concerned about baby's sleepiness and lack of interest in lick & learn bf'ing. She requested assistance with positioning/latch using a nipple shield. Mom pre-pumped but had a letdown when baby was at breast. Baby did not tolerate small volume of milk during letdown and experienced a desat and bradycardic event. Event self resolved when baby was placed on moms chest. RN was present.  Mother and I discussed signs of feeding readiness.  LC relayed consult events to SLP.  Objective Infant data: Mother's Current Feeding Choice: Breast Milk  Infant feeding assessment Scale for Readiness: 3   Maternal data: [id number]  C-Section, Low Transverse  Pumping frequency: / day   Assessment Infant:Infant did not demonstrate po readiness during this encounter.  Intervention/Plan Tools: Nipple Shields 11mm Plan: Consult Status: Follow-up  NICU Follow-up type: Weekly NICU follow up; Assist with IDF-1 (Mother to pre-pump before breastfeeding)  Mother to continue pre-pumping for lick & learn. LC to f/u for further assistance when baby is ready.   Katherine [name] 04/30/2021, 3:33 PM

## 2021-05-04 ENCOUNTER — Ambulatory Visit: Payer: Self-pay

## 2021-05-04 NOTE — Lactation Note (Signed)
This note was copied from a baby's chart. Lactation Consultation Note  Patient Name: Katherine [name] [id number] Date: 05/04/2021 Reason for consult: Follow-up assessment;1st time breastfeeding;Primapara;NICU baby;Early term 37-38.6wks;Mother's request;Breastfeeding assistance Age:31 m.o.  Visited with mom of 98 89/71 weeks old (adjusted) NICU female, she's a P1 and requested a feeding assist. Mom already nursing baby in football position with NS #20 when entered the room, LC assisted with repositioning baby baby would barely suck, she was falling asleep. The brief period when baby was awake and alert, she'd do some feeding cues but would not suck at the breast, she'll do some light sucking on a gloved finger.  Parents have been doing pacifier dips, finger feeding, and also practicing with the no-flow nipple. Mom asked to have an SLP evaluation to see if baby is able to take some volume orally; SLP notified, they're planning on doing an assessment tomorrow.   Plan of care:   Encouraged mom to continue pumping consistently at least 8 times/24 hours.  She'll pump a bit longer in the AM to make sure her breasts are fully emptied She will continue taking baby to a pumped breast on feeding cues until next SLP evaluation   FOB present and very supportive. All questions and concerns answered, parents aware of NICU LC services and will call PRN.  Maternal Data  Mom's supply has slightly increased and is now borderline WNL  Feeding Mother's Current Feeding Choice: Breast Milk  LATCH Score Latch: Repeated attempts needed to sustain latch, nipple held in mouth throughout feeding, stimulation needed to elicit sucking reflex. (with NS # 20)  Audible Swallowing: None (baby fell asleep)  Type of Nipple: Everted at rest and after stimulation  Comfort (Breast/Nipple): Soft / non-tender  Hold (Positioning): No assistance needed to correctly position infant at breast.  LATCH Score: 7  Lactation  Tools Discussed/Used Tools: Pump;Nipple Dorris Carnes;Flanges Nipple shield size: 20 Flange Size: 24 Breast pump type: Double-Electric Breast Pump Pump Education: Setup, frequency, and cleaning;Milk Storage Reason for Pumping: ETI in NICU Pumping frequency: 7 times/24 hours Pumped volume: 90 mL (up to 180 ml in the AM)  Interventions Interventions: Assisted with latch;Skin to skin;Breast massage;Hand express;Breast compression;Support pillows;DEBP;Education  Discharge Pump: DEBP;Personal (Spectra 2 and wearable pump)  Consult Status Consult Status: Follow-up Date: 05/04/21 Follow-up type: In-patient   Nyelle Wolfson Venetia Constable 05/04/2021, 3:57 PM

## 2021-05-09 ENCOUNTER — Ambulatory Visit: Payer: Self-pay

## 2021-05-09 NOTE — Lactation Note (Signed)
This note was copied from a baby's chart. Lactation Consultation Note  Patient Name: Katherine [name] [id number] Date: 05/09/2021 Reason for consult: Follow-up assessment;NICU baby;Preterm <34wks Age:31 m.o.  Lactation followed up with Katherine [name] and daughter, Katherine [name]. Her pumped volumes have remained consistent since our last visit. We discussed her personal preferences for breast pumps; she has a Web designer, a Scientist, product/process development, and an Journalist, newspaper (prefers the [employer] and Mom [employer]zy pumps).  I recommended increasing her pumping frequency if she notes any decrease in production.  Baby is still practicing lick and learn and not yet showing readiness for IDF (though beginning to cue). I encouraged her to schedule more lactation breast feeding consults over the next two weeks as baby is beginning to show some cues.  Katherine [name] plans to stay at home with baby after discharge and would like to exclusively breast feed with occasional bottles in rotation as needed (after discharge). I discussed lactation OP resources for her after discharge and recommended that she schedule a follow up OP within a week of d/c. Her private insurance will support clinic and home visits. She will follow up with Katherine [name] in [place] after discharge.  Maternal Data Does the patient have breastfeeding experience prior to this delivery?: No  Feeding Mother's Current Feeding Choice: Breast Milk Nipple Type: Dr. Levert Feinstein Preemie  Lactation Tools Discussed/Used Breast pump type: Double-Electric Breast Pump Pump Education: Setup, frequency, and cleaning Reason for Pumping: NICU Pumping frequency: 6-7 times a day Pumped volume: 90 mL (about 21 ounces in a day)  Interventions Interventions: Breast feeding basics reviewed;Education  Discharge Discharge Education: Outpatient recommendation  Consult Status Consult Status: Follow-up Date: 05/09/21 Follow-up type: In-patient    Walker Shadow 05/09/2021, 2:33 PM

## 2021-05-14 ENCOUNTER — Ambulatory Visit: Payer: Self-pay

## 2021-05-14 NOTE — Lactation Note (Signed)
This note was copied from a baby's chart.  NICU Lactation Consultation Note  Patient Name: Katherine [name] [id number] Date: 05/14/2021 Age:31 m.o.   Subjective Reason for consult: Follow-up assessment; Breastfeeding assistance; Weekly NICU follow-up LC to room for scheduled bf assistance.  Mother was bottle feeding.  RN observed that baby's rate of gain exceeds expectations over the past few days. She questions if baby is taking more at breast than assumed. Will plan AC/PC weight check tomorrow to further evaluate.  Objective Infant data: Mother's Current Feeding Choice: Breast Milk  Infant feeding assessment Scale for Readiness: 2 Scale for Quality: 3    Maternal data: [id number]  C-Section, Low Transverse  Assessment  Intervention/Plan Interventions: Education  Tools: Nipple Dorris Carnes  Plan: Consult Status: Follow-up  NICU Follow-up type: Assist with IDF-2 (Mother does not need to pre-pump before breastfeeding) (AC/PC weighted feed 11-12 @ 1100)    [name] 05/14/2021, 2:47 PM

## 2021-05-15 ENCOUNTER — Ambulatory Visit: Payer: Self-pay

## 2021-05-15 NOTE — Lactation Note (Addendum)
This note was copied from a baby's chart. Lactation Consultation Note LC presented for AC/PC weight check at 1100 feeding. [name] was awake and latched to breast, but with hiccups and infrequent suckles. 26mL transfer during this visit. Will re-challenge at 2pm feeding if she demonstrates improved feeding interest.   1500: Returned at feeding time to re-challenge. Infant did not wake and bf at this time.   Patient Name: Katherine [name] [id number] Date: 05/15/2021   Age:75 m.o.   [name] 05/15/2021, 12:47 PM

## 2021-05-18 ENCOUNTER — Ambulatory Visit: Payer: Self-pay

## 2021-05-18 NOTE — Lactation Note (Signed)
This note was copied from a baby's chart. Lactation Consultation Note  Patient Name: Katherine [name] [id number] Date: 05/18/2021 Reason for consult: Follow-up assessment;NICU baby;1st time breastfeeding;Primapara;Term;Mother's request Age:31 m.o.  Visited with mom of 102 31/87 weeks old (adjusted) NICU female, she requested a feeding assist for tomorrow; she voiced she'd like to try bottle feeding for the 5 pm feeding.   Baby had a swallow study today and she took 35 ml of thickened formula, PO feeds will now be thickened with rice cereal per SLP recommendation.   Mom wanted to try a different NS because it keeps falling off, when examined her breasts noticed that nipples are almost flat and that might be contributing to NS placement.   Reviewed feeding cues, readiness, newborn behavior and pumping status.  Maternal Data  Mom's supply has increased since she started putting baby to breast, she notices a big difference in her first AM pumping session  Feeding Mother's Current Feeding Choice: Breast Milk  Lactation Tools Discussed/Used Tools: Pump;Flanges;Nipple Shields Nipple shield size: 20 Flange Size: 24 Breast pump type: Double-Electric Breast Pump Pump Education: Setup, frequency, and cleaning;Milk Storage Reason for Pumping: NICU infant Pumping frequency: 6 times/24 hours Pumped volume: 120 mL (up to 255 ml in the AM, average of 23 oz./24 hours)  Interventions Interventions: DEBP;Education;Infant Driven Feeding Algorithm education  Plan of care   Encouraged mom to continue pumping consistently at least 8 times/24 hours or at her own pace She'll pump a bit longer in the AM to make sure her breasts are fully emptied She will continue taking baby to breast on feeding cues using NS # 20; will pre-pump for nipple eversion Mom will continue working with SLPs on bottle feeding and the added thickener   No other support person at this time. All questions and concerns answered,  mom aware of NICU LC services and will call PRN.  Discharge Discharge Education: Outpatient recommendation (mom wants to be F/U by [facility] OP after discharge) Pump: DEBP;Personal (Spectra 2 and wearable pump)  Consult Status Consult Status: Follow-up Date: 05/18/21 Follow-up type: In-patient   Dudley Mages Venetia Constable 05/18/2021, 3:12 PM

## 2021-05-19 ENCOUNTER — Ambulatory Visit: Payer: Self-pay

## 2021-05-19 NOTE — Lactation Note (Signed)
This note was copied from a baby's chart. Lactation Consultation Note  Patient Name: Katherine [name] Date: 05/19/2021 Reason for consult: Follow-up assessment;NICU baby;1st time breastfeeding;Primapara;Breastfeeding assistance;Term Age:31 m.o.  Visited with mom of 38 71/68 weeks old NICU female, she requested a feeding assist. Baby "[name]" was very sleepy, would not stay awake long enough to suck at the breast and transfer; we try pre-pumping to evert mom's nipple and football hold. LC had to fill NS # 20 with breastmilk to hear baby swallowing.   Parents are very eager to do the 72 hour BF trial, but explained to them that baby is not ready yet, NICU RN will talk to practitioner to look for a potential date to start, mom feels like if baby gets a chance to be "hungry" she'll do better at the breast.  SLP Dacia notified of mom's wishes and will F/U with parents.  Maternal Data  Moms supply is WNL  Feeding Mother's Current Feeding Choice: Breast Milk  LATCH Score Latch: Repeated attempts needed to sustain latch, nipple held in mouth throughout feeding, stimulation needed to elicit sucking reflex. (sleepy baby, she wasn't cueing)  Audible Swallowing: A few with stimulation (4 swallows total on the 14 minutes she was at a breast, LC had to milk breast for baby to take it from NS # 20)  Type of Nipple: Flat (nipples are short shafted)  Comfort (Breast/Nipple): Soft / non-tender  Hold (Positioning): No assistance needed to correctly position infant at breast.  LATCH Score: 7  Lactation Tools Discussed/Used Tools: Pump;Flanges;Nipple Shields Nipple shield size: 20 Flange Size: 21 Breast pump type: Double-Electric Breast Pump;Manual  Interventions Interventions: Breast feeding basics reviewed;Assisted with latch;Breast massage;Breast compression;Pre-pump if needed;Hand pump;DEBP;Education;Infant Driven Feeding Algorithm education  Plan of care   Encouraged mom to  continue pumping consistently at least 8 times/24 hours or at her own pace She will continue taking baby to breast on feeding cues using NS # 20; will pre-pump with hand pump for nipple eversion Mom will continue working with SLPs on bottle feeding and the added thickener   No other support person at this time. All questions and concerns answered, mom aware of NICU LC services and will call PRN.  Discharge Pump: DEBP;Manual;Personal (Spectra 2 and wearable pump)  Consult Status Consult Status: Follow-up Date: 05/19/21 Follow-up type: In-patient   Leannah Guse Venetia Constable 05/19/2021, 3:19 PM

## 2021-05-21 ENCOUNTER — Ambulatory Visit: Payer: Self-pay

## 2021-05-21 NOTE — Lactation Note (Signed)
This note was copied from a baby's chart.  NICU Lactation Consultation Note  Patient Name: Katherine [name] [id number] Date: 05/21/2021 Age:31 m.o.   Subjective Reason for consult: Follow-up assessment Mother continues to bf and pump. She feels that [name] is progressing with bf'ing. I will plan a return visit tomorrow to observe and repeat an ac/pc weight check, at her request.   Objective Infant data: Mother's Current Feeding Choice: Breast Milk  Infant feeding assessment Scale for Readiness: 2 Scale for Quality: 3     Maternal data: [id number]  C-Section, Low Transverse  Assessment Infant: LATCH Score: 9  Maternal:   Intervention/Plan Plan: Consult Status: Follow-up  NICU Follow-up type: Assist with IDF-2 (Mother does not need to pre-pump before breastfeeding)    [name] 05/21/2021, 6:06 PM

## 2021-05-22 ENCOUNTER — Ambulatory Visit: Payer: Self-pay

## 2021-05-22 NOTE — Lactation Note (Signed)
This note was copied from a baby's chart.  NICU Lactation Consultation Note  Patient Name: Girl Quinita Kostelecky [id number] Date: 05/22/2021 Age:31 m.o.   Subjective Reason for consult: Follow-up assessment LC to room for ac/pc weighted bf'ing. Mother reports active suckling/swallowing for 11 minutes. There was no difference in the pre- and post-weights. We discussed importance of non-nutritive breastfeeding and apparent need to continue alternate feeding method at this time.   Objective Infant data: No measurable intake  Mother's Current Feeding Choice: Breast Milk  Infant feeding assessment Scale for Readiness: 2 Scale for Quality: 3    Maternal data: [id number]  C-Section, Low Transverse  Assessment Infant: [name] did not transfer milk at this feeding. It is likely that she does transfer small quantities at times. She will benefit from continuing to practice breastfeeding and receiving the entire gavage at this time. Her bf'ing effectiveness may improve over time.   Maternal: Milk supply is wnl   Intervention/Plan Plan: Consult Status: Follow-up  NICU Follow-up type: Weekly NICU follow up    [name] 05/22/2021, 1:43 PM

## 2021-05-24 ENCOUNTER — Ambulatory Visit: Payer: Self-pay

## 2021-05-24 NOTE — Lactation Note (Signed)
This note was copied from a baby's chart. Lactation Consultation Note [name] began ad lib trial today. Parents and I reviewed bf'ing on demand and signs of satiety.  Patient Name: Katherine [name] [id number] Date: 05/24/2021 Reason for consult: Follow-up assessment Age:31 m.o.  Maternal Data  Normal milk supply  Feeding Mother's Current Feeding Choice: Breast Milk  Interventions Interventions: Education  Consult Status Consult Status: Follow-up Date: 05/24/21 Follow-up type: In-patient   [name] 05/24/2021, 1:00 PM

## 2021-06-03 ENCOUNTER — Ambulatory Visit: Payer: Self-pay

## 2021-06-03 NOTE — Lactation Note (Signed)
This note was copied from a baby's chart. Lactation Consultation Note  Patient Name: Katherine [name] [id number] Date: 06/03/2021 Reason for consult: Follow-up assessment;Other (Comment) (discharge) Age:31 m.o.  Lactation followed up with Ms. Katherine [name] and her spouse and [name]. They are preparing for discharge today. Baby will go home feeding via a combination of NG tube and bottle. Parents plan to use [facility]. Ms. Katherine [name] states that her goal is to breast feed, but she has temporarily tabled that goal until Katherine [name] is discharged and feeding more consistently. She is well aware of her lactation resources (she has a provider at [facility]), including our [employer]. I provided our contact information and reviewed her options. She also has private insurance that possibly covers home lactation visits.  Ms. Katherine [name] had no additional questions at this time.  Feeding Mother's Current Feeding Choice: Breast Milk  Interventions Interventions: LC Services brochure  Discharge Discharge Education: Engorgement and breast care;Outpatient recommendation;Outpatient Epic message sent Pump: Personal  Consult Status Consult Status: Complete Date: 06/03/21 Follow-up type: In-patient    Katherine [name] 06/03/2021, 9:31 AM

## 2021-07-02 DIAGNOSIS — R1312 Dysphagia, oropharyngeal phase: Secondary | ICD-10-CM | POA: Diagnosis not present

## 2021-07-02 DIAGNOSIS — R638 Other symptoms and signs concerning food and fluid intake: Secondary | ICD-10-CM | POA: Diagnosis not present

## 2021-07-02 DIAGNOSIS — H35139 Retinopathy of prematurity, stage 2, unspecified eye: Secondary | ICD-10-CM | POA: Diagnosis not present

## 2021-07-02 DIAGNOSIS — K219 Gastro-esophageal reflux disease without esophagitis: Secondary | ICD-10-CM | POA: Diagnosis not present

## 2021-07-21 DIAGNOSIS — L659 Nonscarring hair loss, unspecified: Secondary | ICD-10-CM | POA: Diagnosis not present

## 2021-08-02 DIAGNOSIS — F419 Anxiety disorder, unspecified: Secondary | ICD-10-CM | POA: Diagnosis not present

## 2021-08-02 DIAGNOSIS — K219 Gastro-esophageal reflux disease without esophagitis: Secondary | ICD-10-CM | POA: Diagnosis not present

## 2021-08-02 DIAGNOSIS — H35139 Retinopathy of prematurity, stage 2, unspecified eye: Secondary | ICD-10-CM | POA: Diagnosis not present

## 2021-08-02 DIAGNOSIS — R1312 Dysphagia, oropharyngeal phase: Secondary | ICD-10-CM | POA: Diagnosis not present

## 2021-08-02 DIAGNOSIS — R638 Other symptoms and signs concerning food and fluid intake: Secondary | ICD-10-CM | POA: Diagnosis not present

## 2021-08-31 DIAGNOSIS — K219 Gastro-esophageal reflux disease without esophagitis: Secondary | ICD-10-CM | POA: Diagnosis not present

## 2021-08-31 DIAGNOSIS — H35139 Retinopathy of prematurity, stage 2, unspecified eye: Secondary | ICD-10-CM | POA: Diagnosis not present

## 2021-08-31 DIAGNOSIS — R638 Other symptoms and signs concerning food and fluid intake: Secondary | ICD-10-CM | POA: Diagnosis not present

## 2021-08-31 DIAGNOSIS — R1312 Dysphagia, oropharyngeal phase: Secondary | ICD-10-CM | POA: Diagnosis not present

## 2021-09-13 ENCOUNTER — Ambulatory Visit (INDEPENDENT_AMBULATORY_CARE_PROVIDER_SITE_OTHER): Payer: BC Managed Care – PPO | Admitting: Family Medicine

## 2021-09-13 ENCOUNTER — Encounter: Payer: Self-pay | Admitting: Family Medicine

## 2021-09-13 VITALS — BP 136/78 | HR 80 | Temp 98.3°F | Ht 67.0 in | Wt 234.0 lb

## 2021-09-13 DIAGNOSIS — J069 Acute upper respiratory infection, unspecified: Secondary | ICD-10-CM

## 2021-09-13 NOTE — Patient Instructions (Addendum)
Safe Medications in Pregnancy  ? ?Acne: ?Benzoyl Peroxide ?Salicylic Acid ? ?Backache/Headache: ?Tylenol: 2 regular strength every 4 hours OR ?             2 Extra strength every 6 hours ? ?Colds/Coughs/Allergies: ?Benadryl (alcohol free) 25 mg every 6 hours as needed ?Breath right strips ?Claritin ?Cepacol throat lozenges ?Chloraseptic throat spray ?Cold-Eeze- up to three times per day ?Cough drops, alcohol free ?Flonase (by prescription only) ?Guaifenesin ?Mucinex ?Robitussin DM (plain only, alcohol free) ?Saline nasal spray/drops ?Sudafed (pseudoephedrine) & Actifed ** use only after [redacted] weeks gestation and if you do not have high blood pressure ?Tylenol ?Vicks Vaporub ?Zinc lozenges ?Zyrtec  ? ?Constipation: ?Colace ?Ducolax suppositories ?Fleet enema ?Glycerin suppositories ?Metamucil ?Milk of magnesia ?Miralax ?Senokot ?Smooth move tea ? ?Diarrhea: ?Kaopectate ?Imodium A-D ? ?*NO pepto Bismol ? ?Hemorrhoids: ?Anusol ?Anusol HC ?Preparation H ?Tucks ? ?Indigestion: ?Tums ?Maalox ?Mylanta ?Zantac  ?Pepcid ? ?Insomnia: ?Benadryl (alcohol free) 25mg  every 6 hours as needed ?Tylenol PM ?Unisom, no Gelcaps ? ?Leg Cramps: ?Tums ?MagGel ? ?Nausea/Vomiting:  ?Bonine ?Dramamine ?Emetrol ?Ginger extract ?Sea bands ?Meclizine  ?Nausea medication to take during pregnancy:  ?Unisom (doxylamine succinate 25 mg tablets) Take one tablet daily at bedtime. If symptoms are not adequately controlled, the dose can be increased to a maximum recommended dose of two tablets daily (1/2 tablet in the morning, 1/2 tablet mid-afternoon and one at bedtime). ?Vitamin B6 100mg  tablet once a day or 1/2 tablet twice a day. ? ?Skin Rashes: ?Aveeno products ?Benadryl cream or 25mg  every 6 hours as needed ?Calamine Lotion ?1% cortisone cream ? ?Yeast infection: ?Gyne-lotrimin 7 ?Monistat 7 ? ? ?**If taking multiple medications, please check labels to avoid duplicating the same active ingredients ?**take medication as directed on the label ?** Do  not exceed 4000 mg of tylenol in 24 hours ?**Do not take medications that contain aspirin or ibuprofen ? ? ? ?Breastfeeding and Medicine Use ?Most medicines are safe for you to take while breastfeeding because only a small amount of medicine passes into your breast milk. However, it is important to talk with your health care provider about all vaccines and medicines that you are taking while breastfeeding. These include: ?Prescription medicines. ?Over-the-counter medicines. ?Vitamins, herbs, and supplements. ?Eye drops. ?Creams. ?Medicines that are commonly taken after delivering a baby are safe to take while breastfeeding. These include ibuprofen, acetaminophen, and stool softeners. ?How does this affect me? ?Some medicines may lessen your milk supply. Most women can continue to take those medicines for a short period of time with no effect on breastfeeding overall. Work with your health care provider or breastfeeding specialist ( ) to find ways to maintain your milk supply. ?How does this affect my baby? ?When you are breastfeeding, small amounts of medicines that you take can pass to your baby through your breast milk. In most cases, these small amounts are not harmful to your baby. ?Some medicines may be present in larger amounts in your breast milk. To keep your baby safe, your health care provider might recommend that you stop breastfeeding while taking a certain medicine. You may need to stop breastfeeding for a short time or permanently. This will depend on how long you need to take the medicine. ?What can I do to lower my risk? ?Do not take any new medicines or get any vaccines unless you have talked about it with your health care provider and your baby's health care provider. Always read medicine labels before using a medicine. Check  for risks for women who are breastfeeding. ?Avoid taking: ?Over-the-counter cold and allergy medicines that contain pseudoephedrine. This ingredient can  lessen your milk supply. ?Medicines that are not medically necessary for you. These may include herbal medicines, high-dose vitamins, and some supplements. Work with your health care provider to determine which medicines you truly need. ?What actions can I take to manage this? ? ?Try to take your medicine right after you breastfeed. This can help to limit your baby's exposure to the medicine the next time you breastfeed. ?If you know ahead of time that you will need to stop breastfeeding for a short time, plan ahead. ?Before you start taking the medicine, pump and store a supply of breast milk. Feed this milk to your baby while you are taking the medicine. ?While you take the medicine, pump and throw away breast milk until you are no longer taking the medicine. Continuing to pump can help to make sure your body will be ready to breastfeed after you no longer need the medicine. ?Follow these instructions at home: ?If you start a new medicine, watch your baby for unusual signs such as sleepiness or irritability. ?Do not stop taking a prescription medicine unless your health care provider tells you to stop. Talk with your health care provider about whether you really need to take the medicine. ?Take over-the-counter and prescription medicines only as told by your health care provider. ?Where to find more information ?U.S. Food & Drug Administration: [place]Maps.no ?Infant Risk Center: ?Online at [employer].com ?Hotline: 928-624-5745 ?Mother to Baby: mothertobaby.org ?Contact a health care provider if: ?You or your baby develop new symptoms after you start taking a medicine. ?You have trouble producing milk or your supply decreases. ?Your baby is not gaining weight after you start taking a new medicine. ?You have breast pain that does not get better with over-the-counter pain medicines. ?Summary ?Most medicines are safe to take while breastfeeding because the amount of medicine in breast milk is too small to harm the  baby. ?Do not take any new medicines or get any vaccines unless you have talked about it with your health care provider and your baby's health care provider. ?If you start a new medicine, watch your baby for unusual signs such as sleepiness or irritability. ?This information is not intended to replace advice given to you by your health care provider. Make sure you discuss any questions you have with your health care provider. ?Document Revised: 01/16/2020 Document Reviewed: 01/16/2020 ?Elsevier Patient Education ? 2022 Elsevier Inc. ?

## 2021-09-13 NOTE — Progress Notes (Signed)
? ?Acute Office Visit ? ?Subjective:  ? ? Patient ID: Katherine [name], female    DOB: Dec 04, 1990, 31 y.o.   MRN: [id number] ? ?CC: sore throat, rhinorrhea, sinus pressure ? ? ?HPI ?Patient is in today for sore throat, URI symptoms.  ? ?Yesterday patient felt more tired than usual and by the evening she started with a sore throat that has progressively worsened. Today she is reporting sore throat, sinus pressure/ethmoid, nasal congestion, rhinorrhea, 100.64F earlier today, bilateral ear pain 4/10. Her nephew has a double ear infection, mom has a sinus infection, and dad has a viral infection. She denies any headaches, chest pain, dyspnea, body aches, chills, cough, GI/GU symptoms. She had a negative COVID test earlier today. Reports ibuprofen did help her symptoms this morning.  ? ? ? ?Past Medical History:  ?Diagnosis Date  ? Anxiety   ? Asthma   ? Depression   ? Elevated blood pressure reading   ? Frequent headaches   ? Recurrent UTI   ? ? ?Past Surgical History:  ?Procedure Laterality Date  ? CESAREAN SECTION N/A 03/01/2021  ? Procedure: CESAREAN SECTION;  Surgeon: Lyn Henri, MD;  Location: [facility];  Service: Obstetrics;  Laterality: N/A;  ? DILATION AND EVACUATION N/A 02/11/2020  ? Procedure: DILATATION AND EVACUATION;  Surgeon: Mitchel Honour, DO;  Location: [facility] OR;  Service: Gynecology;  Laterality: N/A;  ? ? ?Family History  ?Problem Relation Age of Onset  ? Hypertension Father   ? Hypertension Brother   ? ? ?Social History  ? ?Socioeconomic History  ? Marital status: Married  ?  Spouse name: Not on file  ? Number of children: Not on file  ? Years of education: Not on file  ? Highest education level: Not on file  ?Occupational History  ? Occupation: CSR  ?Tobacco Use  ? Smoking status: Never  ? Smokeless tobacco: Never  ?Vaping Use  ? Vaping Use: Never used  ?Substance and Sexual Activity  ? Alcohol use: Yes  ?  Alcohol/week: 2.0 - 3.0 standard drinks  ?  Types: 2 - 3 Standard drinks or equivalent per  week  ?  Comment: occassionally  ? Drug use: No  ? Sexual activity: Yes  ?  Birth control/protection: None  ?Other Topics Concern  ? Not on file  ?Social History Narrative  ? Not on file  ? ?Social Determinants of Health  ? ?Financial Resource Strain: Not on file  ?Food Insecurity: Not on file  ?Transportation Needs: Not on file  ?Physical Activity: Not on file  ?Stress: Not on file  ?Social Connections: Not on file  ?Intimate Partner Violence: Not on file  ? ? ?Outpatient Medications Prior to Visit  ?Medication Sig Dispense Refill  ? sertraline (ZOLOFT) 100 MG tablet Take 1 tablet (100 mg total) by mouth at bedtime. (Patient taking differently: Take 150 mg by mouth at bedtime.) 90 tablet 3  ? amoxicillin-clavulanate (AUGMENTIN) 875-125 MG tablet Take 1 tablet by mouth 2 (two) times daily. 14 tablet 0  ? fluticasone (FLONASE) 50 MCG/ACT nasal spray Place 2 sprays into both nostrils daily. 16 g 0  ? loratadine (CLARITIN) 10 MG tablet Take 1 tablet (10 mg total) by mouth daily. 30 tablet 0  ? NIFEdipine (ADALAT CC) 60 MG 24 hr tablet Take 1 tablet (60 mg total) by mouth 2 (two) times daily. 60 tablet 1  ? Prenatal MV & Min w/FA-DHA (PRENATAL ADULT GUMMY/DHA/FA PO) Take 2 tablets by mouth at bedtime. Gummies    ? ?  No facility-administered medications prior to visit.  ? ? ?Allergies  ?Allergen Reactions  ? Prednisone Rash and Other (See Comments)  ?  Pt states "It makes me feel weird.  ? ? ?Review of Systems ?All review of systems negative except what is listed in the HPI ? ?   ?Objective:  ?  ?Physical Exam ?Vitals reviewed.  ?Constitutional:   ?   Appearance: Normal appearance.  ?HENT:  ?   Head: Normocephalic and atraumatic.  ?   Right Ear: Tympanic membrane normal.  ?   Left Ear: Tympanic membrane normal.  ?   Nose: Congestion and rhinorrhea present.  ?   Mouth/Throat:  ?   Mouth: Mucous membranes are moist.  ?   Pharynx: Oropharynx is clear. Posterior oropharyngeal erythema present. No oropharyngeal exudate.   ?Eyes:  ?   Extraocular Movements: Extraocular movements intact.  ?   Conjunctiva/sclera: Conjunctivae normal.  ?Cardiovascular:  ?   Rate and Rhythm: Normal rate and regular rhythm.  ?Pulmonary:  ?   Effort: Pulmonary effort is normal.  ?   Breath sounds: Normal breath sounds.  ?Musculoskeletal:  ?   Cervical back: Normal range of motion and neck supple. No tenderness.  ?Lymphadenopathy:  ?   Cervical: No cervical adenopathy.  ?Skin: ?   General: Skin is warm and dry.  ?Neurological:  ?   General: No focal deficit present.  ?   Mental Status: She is alert and oriented to person, place, and time.  ?Psychiatric:     ?   Mood and Affect: Mood normal.     ?   Behavior: Behavior normal.     ?   Thought Content: Thought content normal.     ?   Judgment: Judgment normal.  ? ? ?BP 136/78   Pulse 80   Temp 98.3 ?F (36.8 ?C)   Ht 5\' 7"  (1.702 m)   Wt 234 lb (106.1 kg)   Breastfeeding Yes   BMI 36.65 kg/m?  ?Wt Readings from Last 3 Encounters:  ?09/13/21 234 lb (106.1 kg)  ?04/22/21 225 lb (102.1 kg)  ?03/05/21 226 lb 9.6 oz (102.8 kg)  ? ? ?Health Maintenance Due  ?Topic Date Due  ? COVID-19 Vaccine (1) Never done  ? HIV Screening  Never done  ? Hepatitis C Screening  Never done  ? PAP SMEAR-Modifier  02/16/2015  ? INFLUENZA VACCINE  Never done  ? ? ?There are no preventive care reminders to display for this patient. ? ? ?Lab Results  ?Component Value Date  ? TSH 2.10 04/27/2020  ? ?Lab Results  ?Component Value Date  ? WBC 17.3 (H) 03/02/2021  ? HGB 12.0 03/02/2021  ? HCT 36.4 03/02/2021  ? MCV 92.4 03/02/2021  ? PLT 220 03/02/2021  ? ?Lab Results  ?Component Value Date  ? NA 135 03/02/2021  ? K 5.3 (H) 03/02/2021  ? CO2 25 03/02/2021  ? GLUCOSE 123 (H) 03/02/2021  ? BUN 13 03/02/2021  ? CREATININE 0.86 03/02/2021  ? BILITOT 0.6 03/02/2021  ? ALKPHOS 69 03/02/2021  ? AST 24 03/02/2021  ? ALT 29 03/02/2021  ? PROT 5.4 (L) 03/02/2021  ? ALBUMIN 2.5 (L) 03/02/2021  ? CALCIUM 6.9 (L) 03/02/2021  ? ANIONGAP 7 03/02/2021   ? GFR 104.84 06/02/2017  ? ?Lab Results  ?Component Value Date  ? CHOL 167 04/27/2020  ? ?Lab Results  ?Component Value Date  ? HDL 50 04/27/2020  ? ?Lab Results  ?Component Value Date  ? LDLCALC 96  04/27/2020  ? ?Lab Results  ?Component Value Date  ? TRIG 113 04/27/2020  ? ?Lab Results  ?Component Value Date  ? CHOLHDL 3.3 04/27/2020  ? ?Lab Results  ?Component Value Date  ? HGBA1C 5.1 04/27/2020  ? ? ?   ?Assessment & Plan:  ? ?1. Viral URI ?Recommended she take another COVID test tomorrow incase she tested too soon. She is not interested in antiviral therapy if positive. Recommended supportive measures for awhile and if not improving by day 8-10 or if significantly worse before then, we can consider ABRS and start ABX.  ?Continue supportive measures including rest, hydration, humidifier use, steam showers, warm compresses to sinuses, warm liquids with lemon and honey, and over-the-counter cough, cold, and analgesics as needed.  ?Patient aware of signs/symptoms requiring further/urgent evaluation.  ? ?Please contact office for follow-up if symptoms do not improve or worsen. Seek emergency care if symptoms become severe. ? ? ?Clayborne Dana, NP ? ?

## 2021-09-17 DIAGNOSIS — F419 Anxiety disorder, unspecified: Secondary | ICD-10-CM | POA: Diagnosis not present

## 2021-09-30 DIAGNOSIS — R638 Other symptoms and signs concerning food and fluid intake: Secondary | ICD-10-CM | POA: Diagnosis not present

## 2021-09-30 DIAGNOSIS — R1312 Dysphagia, oropharyngeal phase: Secondary | ICD-10-CM | POA: Diagnosis not present

## 2021-09-30 DIAGNOSIS — K219 Gastro-esophageal reflux disease without esophagitis: Secondary | ICD-10-CM | POA: Diagnosis not present

## 2021-09-30 DIAGNOSIS — H35139 Retinopathy of prematurity, stage 2, unspecified eye: Secondary | ICD-10-CM | POA: Diagnosis not present

## 2021-10-31 DIAGNOSIS — K219 Gastro-esophageal reflux disease without esophagitis: Secondary | ICD-10-CM | POA: Diagnosis not present

## 2021-10-31 DIAGNOSIS — R1312 Dysphagia, oropharyngeal phase: Secondary | ICD-10-CM | POA: Diagnosis not present

## 2021-10-31 DIAGNOSIS — R638 Other symptoms and signs concerning food and fluid intake: Secondary | ICD-10-CM | POA: Diagnosis not present

## 2021-10-31 DIAGNOSIS — H35139 Retinopathy of prematurity, stage 2, unspecified eye: Secondary | ICD-10-CM | POA: Diagnosis not present

## 2021-11-14 NOTE — Progress Notes (Addendum)
Therapist, music at [facility] ?[address], [address] ?[place], [place] [address] ?336 [phone] ?Fax [phone] ? ?Date:  11/17/2021  ? ?Name:  Katherine [name]   DOB:  August 31, 1990   MRN:  [id number] ? ?PCP:  Katherine [name], Katherine [name]  ? ? ?Chief Complaint: Med Change Request (Pt is on Zoloft 150 mg but feels like she is in a daze all the time. She also feels like she has ADD as well and would like to explore this. ) ? ? ?History of Present Illness: ? ?Katherine [name] is a 31 y.o. very pleasant female patient who presents with the following: ? ?Patient seen today to discuss medication and stress ?Most recent visit with myself was in December 2021 ? ?She delivered a baby via C-section on 8/29-she delivered at 28 weeks due to preeclampsia ?Her daughter Katherine [name] is doing well!   ?She had a tough pregnancy with nausea, and then her BP went up and up-  ?She had been on 100 mg of zoloft- then the baby came home and her anxiety went up a lot ?Her OBG took her up to 150 mg sertraline which helped for a while- but now sx are not being controlled ? ?She also feels like she might have ADD- she has a hard time focusing, she feels fidgety a lot of the time ?Never dx with ADD in the past  ? ?She is no longer BF ? ?She was not evaluated for ADD as a child- she got through school ok.   ?She is staying home with her daughter  ?She tends to have both anxiety and depression ?She is tired a lot but thinks this may be lack of sleep  ?Her daughter is nearly 48 months adjusted age- 27 months by birth ? ?Pt is sleeping well except when her daughter wakes her up  ? ?No SI ? ? ? ?Patient Active Problem List  ? Diagnosis Date Noted  ? Sore throat 04/22/2021  ? Nasal congestion 04/22/2021  ? Sinus pressure 04/22/2021  ? Preeclampsia, severe 03/01/2021  ? IUGR (intrauterine growth restriction) affecting care of mother 02/24/2021  ? Transient hypertension of pregnancy 02/18/2021  ? Overweight 01/14/2016  ? ? ?Past Medical History:   ?Diagnosis Date  ? Anxiety   ? Asthma   ? Depression   ? Elevated blood pressure reading   ? Frequent headaches   ? Recurrent UTI   ? ? ?Past Surgical History:  ?Procedure Laterality Date  ? CESAREAN SECTION N/A 03/01/2021  ? Procedure: CESAREAN SECTION;  Surgeon: Carlyon Shadow, Katherine [name];  Location: [facility];  Service: Obstetrics;  Laterality: N/A;  ? DILATION AND EVACUATION N/A 02/11/2020  ? Procedure: DILATATION AND EVACUATION;  Surgeon: Linda Hedges, DO;  Location: [facility];  Service: Gynecology;  Laterality: N/A;  ? ? ?Social History  ? ?Tobacco Use  ? Smoking status: Never  ? Smokeless tobacco: Never  ?Vaping Use  ? Vaping Use: Never used  ?Substance Use Topics  ? Alcohol use: Yes  ?  Alcohol/week: 2.0 - 3.0 standard drinks  ?  Types: 2 - 3 Standard drinks or equivalent per week  ?  Comment: occassionally  ? Drug use: No  ? ? ?Family History  ?Problem Relation Age of Onset  ? Hypertension Father   ? Hypertension Brother   ? ? ?Allergies  ?Allergen Reactions  ? Prednisone Rash and Other (See Comments)  ?  Pt states "It makes me feel weird.  ? ? ?  Medication list has been reviewed and updated. ? ?Current Outpatient Medications on File Prior to Visit  ?Medication Sig Dispense Refill  ? sertraline (ZOLOFT) 100 MG tablet Take 1 tablet (100 mg total) by mouth at bedtime. (Patient taking differently: Take 150 mg by mouth at bedtime.) 90 tablet 3  ? ?No current facility-administered medications on file prior to visit.  ? ? ?Review of Systems: ? ?As per HPI- otherwise negative. ? ? ?Physical Examination: ?Vitals:  ? 11/17/21 0826  ?BP: 110/80  ?Pulse: 86  ?Resp: 18  ?Temp: 98.6 ?F (37 ?C)  ?SpO2: 98%  ? ?Vitals:  ? 11/17/21 0826  ?Weight: 240 lb 9.6 oz (109.1 kg)  ?Height: 5\' 7"  (1.702 m)  ? ?Body mass index is 37.68 kg/m?. ?Ideal Body Weight: Weight in (lb) to have BMI = 25: 159.3 ? ?GEN: no acute distress. Obese, looks well  ?HEENT: Atraumatic, Normocephalic.  ?Ears and Nose: No external deformity. ?CV: RRR, No M/G/R. No  JVD. No thrill. No extra heart sounds. ?PULM: CTA B, no wheezes, crackles, rhonchi. No retractions. No resp. distress. No accessory muscle use. ?ABD: S, NT, ND ?EXTR: No c/c/e ?PSYCH: Normally interactive. Conversant.  ? ? ?Assessment and Plan: ?Adjustment disorder with mixed anxiety and depressed mood - Plan: venlafaxine XR (EFFEXOR XR) 75 MG 24 hr capsule ? ?Screening for deficiency anemia - Plan: CBC ? ?Screening for diabetes mellitus - Plan: Comprehensive metabolic panel, Hemoglobin A1c ? ?Screening for thyroid disorder - Plan: TSH ? ?Screening for hyperlipidemia - Plan: Lipid panel ? ?Following up today ?Recent traumatic early delivery experience.  ?Will taper off sertraline and try effexor ?I have asked her to keep me closely posted about her progress ?Recheck in 3 months   ?Signed ?Abbe Amsterdam, Katherine [name] ? ?Received labs as below, message to patient ?Results for orders placed or performed in visit on 11/17/21  ?CBC  ?Result Value Ref Range  ? WBC 6.5 4.0 - 10.5 K/uL  ? RBC 4.73 3.87 - 5.11 Mil/uL  ? Platelets 253.0 150.0 - 400.0 K/uL  ? Hemoglobin 13.7 12.0 - 15.0 g/dL  ? HCT 41.3 36.0 - 46.0 %  ? MCV 87.4 78.0 - 100.0 fl  ? MCHC 33.1 30.0 - 36.0 g/dL  ? RDW 14.4 11.5 - 15.5 %  ?Comprehensive metabolic panel  ?Result Value Ref Range  ? Sodium 138 135 - 145 mEq/L  ? Potassium 4.7 3.5 - 5.1 mEq/L  ? Chloride 104 96 - 112 mEq/L  ? CO2 27 19 - 32 mEq/L  ? Glucose, Bld 95 70 - 99 mg/dL  ? BUN 16 6 - 23 mg/dL  ? Creatinine, Ser 0.72 0.40 - 1.20 mg/dL  ? Total Bilirubin 0.5 0.2 - 1.2 mg/dL  ? Alkaline Phosphatase 60 39 - 117 U/L  ? AST 10 0 - 37 U/L  ? ALT 9 0 - 35 U/L  ? Total Protein 7.0 6.0 - 8.3 g/dL  ? Albumin 4.4 3.5 - 5.2 g/dL  ? GFR 111.27 >60.00 mL/min  ? Calcium 9.4 8.4 - 10.5 mg/dL  ?Hemoglobin A1c  ?Result Value Ref Range  ? Hgb A1c MFr Bld 5.4 4.6 - 6.5 %  ?Lipid panel  ?Result Value Ref Range  ? Cholesterol 180 0 - 200 mg/dL  ? Triglycerides 100.0 0.0 - 149.0 mg/dL  ? HDL 53.90 >39.00 mg/dL  ? VLDL 20.0  0.0 - 40.0 mg/dL  ? LDL Cholesterol 106 (H) 0 - 99 mg/dL  ? Total CHOL/HDL Ratio 3   ? NonHDL  126.12   ?TSH  ?Result Value Ref Range  ? TSH 4.28 0.35 - 5.50 uIU/mL  ? ? ? ?

## 2021-11-16 NOTE — Patient Instructions (Addendum)
It was great to see you again today!   Congrats on your gorgeous daughter [name]!  ?Please keep me posted about how you are doing - send me a mychart message in about 2 weeks with an update, sooner if not doing well ? ?Decrease zoloft to 100 mg for 2-3 days, then 50 mg for 2-3 days- then stop and start on effexor/ venlafaxine ? ?Let's visit in 3 months assuming you are doing ok ? ?If you would like, consult with psychologist of your choice for ADD evaluation  ?

## 2021-11-17 ENCOUNTER — Ambulatory Visit (INDEPENDENT_AMBULATORY_CARE_PROVIDER_SITE_OTHER): Payer: BC Managed Care – PPO | Admitting: Family Medicine

## 2021-11-17 ENCOUNTER — Encounter: Payer: Self-pay | Admitting: Family Medicine

## 2021-11-17 VITALS — BP 110/80 | HR 86 | Temp 98.6°F | Resp 18 | Ht 67.0 in | Wt 240.6 lb

## 2021-11-17 DIAGNOSIS — F4323 Adjustment disorder with mixed anxiety and depressed mood: Secondary | ICD-10-CM

## 2021-11-17 DIAGNOSIS — Z1329 Encounter for screening for other suspected endocrine disorder: Secondary | ICD-10-CM | POA: Diagnosis not present

## 2021-11-17 DIAGNOSIS — Z130 Encounter for screening for diseases of the blood and blood-forming organs and certain disorders involving the immune mechanism: Secondary | ICD-10-CM | POA: Diagnosis not present

## 2021-11-17 DIAGNOSIS — Z13220 Encounter for screening for lipoid disorders: Secondary | ICD-10-CM | POA: Diagnosis not present

## 2021-11-17 DIAGNOSIS — Z131 Encounter for screening for diabetes mellitus: Secondary | ICD-10-CM | POA: Diagnosis not present

## 2021-11-17 LAB — CBC
HCT: 41.3 % (ref 36.0–46.0)
Hemoglobin: 13.7 g/dL (ref 12.0–15.0)
MCHC: 33.1 g/dL (ref 30.0–36.0)
MCV: 87.4 fl (ref 78.0–100.0)
Platelets: 253 10*3/uL (ref 150.0–400.0)
RBC: 4.73 Mil/uL (ref 3.87–5.11)
RDW: 14.4 % (ref 11.5–15.5)
WBC: 6.5 10*3/uL (ref 4.0–10.5)

## 2021-11-17 LAB — COMPREHENSIVE METABOLIC PANEL
ALT: 9 U/L (ref 0–35)
AST: 10 U/L (ref 0–37)
Albumin: 4.4 g/dL (ref 3.5–5.2)
Alkaline Phosphatase: 60 U/L (ref 39–117)
BUN: 16 mg/dL (ref 6–23)
CO2: 27 mEq/L (ref 19–32)
Calcium: 9.4 mg/dL (ref 8.4–10.5)
Chloride: 104 mEq/L (ref 96–112)
Creatinine, Ser: 0.72 mg/dL (ref 0.40–1.20)
GFR: 111.27 mL/min (ref >60.00)
Glucose, Bld: 95 mg/dL (ref 70–99)
Potassium: 4.7 mEq/L (ref 3.5–5.1)
Sodium: 138 mEq/L (ref 135–145)
Total Bilirubin: 0.5 mg/dL (ref 0.2–1.2)
Total Protein: 7 g/dL (ref 6.0–8.3)

## 2021-11-17 LAB — LIPID PANEL
Cholesterol: 180 mg/dL (ref 0–200)
HDL: 53.9 mg/dL (ref >39.00)
LDL Cholesterol: 106 mg/dL — ABNORMAL HIGH (ref 0–99)
NonHDL: 126.12
Total CHOL/HDL Ratio: 3
Triglycerides: 100 mg/dL (ref 0.0–149.0)
VLDL: 20 mg/dL (ref 0.0–40.0)

## 2021-11-17 LAB — HEMOGLOBIN A1C: Hgb A1c MFr Bld: 5.4 % (ref 4.6–6.5)

## 2021-11-17 LAB — TSH: TSH: 4.28 u[IU]/mL (ref 0.35–5.50)

## 2021-11-17 MED ORDER — VENLAFAXINE HCL ER 75 MG PO CP24: 75.0000 mg | ORAL_CAPSULE | Freq: Every day | ORAL | 6 refills | Status: DC

## 2021-11-30 DIAGNOSIS — R638 Other symptoms and signs concerning food and fluid intake: Secondary | ICD-10-CM | POA: Diagnosis not present

## 2021-11-30 DIAGNOSIS — H35139 Retinopathy of prematurity, stage 2, unspecified eye: Secondary | ICD-10-CM | POA: Diagnosis not present

## 2021-11-30 DIAGNOSIS — R1312 Dysphagia, oropharyngeal phase: Secondary | ICD-10-CM | POA: Diagnosis not present

## 2021-11-30 DIAGNOSIS — K219 Gastro-esophageal reflux disease without esophagitis: Secondary | ICD-10-CM | POA: Diagnosis not present

## 2021-12-29 ENCOUNTER — Ambulatory Visit (INDEPENDENT_AMBULATORY_CARE_PROVIDER_SITE_OTHER): Payer: BC Managed Care – PPO | Admitting: Family

## 2021-12-29 VITALS — BP 136/91 | HR 132 | Temp 98.7°F | Resp 18 | Wt 246.0 lb

## 2021-12-29 DIAGNOSIS — J029 Acute pharyngitis, unspecified: Secondary | ICD-10-CM

## 2021-12-29 DIAGNOSIS — R52 Pain, unspecified: Secondary | ICD-10-CM

## 2021-12-29 DIAGNOSIS — J020 Streptococcal pharyngitis: Secondary | ICD-10-CM | POA: Diagnosis not present

## 2021-12-29 MED ORDER — LIDOCAINE VISCOUS HCL 2 % MT SOLN: 15.0000 mL | OROMUCOSAL | 0 refills | Status: DC | PRN

## 2021-12-29 MED ORDER — AMOXICILLIN 500 MG PO CAPS: 500.0000 mg | ORAL_CAPSULE | Freq: Three times a day (TID) | ORAL | 0 refills | Status: AC

## 2021-12-29 NOTE — Patient Instructions (Signed)
Please start amoxicillin 3x daily for 10 days for strep. Change out your toothbrush in 24 hours. For pain you may use ibuprofen 600mg  every 6 hours as needed for pain and/or lidocaine syrup. Drink at least 2 liters of fluid today (preferably gatorade/water). If dizziness persists despite hydration or if heart rate remains >120 after hydration, please go to the ER.

## 2021-12-29 NOTE — Progress Notes (Signed)
Subjective:   By signing my name below, I, Carylon Perches, attest that this documentation has been prepared under the direction and in the presence of Karie Chimera, NP 12/29/2021    Patient ID: Katherine [name], female    DOB: 1990/07/13, 31 y.o.   MRN: [id number]  Chief Complaint  Patient presents with   Sore Throat    Complains of sore throat for 4 days    Generalized Body Aches    Complains of body aches that started today    HPI Patient is in today for an office visit  Sore Throat/Generalized Body Aches: She complains of a sore throat and general body aches. She states that she noticed a scratchy throat on 12/26/2021. The next day on 12/27/2021, she felt a stabbing pain in her throat. The morning of 12/29/2021, she states that she also has a fever of 106 F, body aches and chills. As of today's visit, she also reports feeling dizzy. She has not been eating much due to her throat pain. She took a Covid-19 test on 12/29/2021 and states that the results were negative.   Health Maintenance Due  Topic Date Due   COVID-19 Vaccine (1) Never done   HIV Screening  Never done   Hepatitis C Screening  Never done   PAP SMEAR-Modifier  02/16/2015    Past Medical History:  Diagnosis Date   Anxiety    Asthma    Depression    Elevated blood pressure reading    Frequent headaches    Recurrent UTI     Past Surgical History:  Procedure Laterality Date   CESAREAN SECTION N/A 03/01/2021   Procedure: CESAREAN SECTION;  Surgeon: Carlyon Shadow, MD;  [facility];  Service: Obstetrics;  Laterality: N/A;   DILATION AND EVACUATION N/A 02/11/2020   Procedure: DILATATION AND EVACUATION;  Surgeon: Linda Hedges, DO;  Location: [place];  Service: Gynecology;  Laterality: N/A;    Family History  Problem Relation Age of Onset   Hypertension Father    Hypertension Brother     Social History   Socioeconomic History   Marital status: Married    Spouse name: Not on file    Number of children: Not on file   Years of education: Not on file   Highest education level: Not on file  Occupational History   Occupation: CSR  Tobacco Use   Smoking status: Never   Smokeless tobacco: Never  Vaping Use   Vaping Use: Never used  Substance and Sexual Activity   Alcohol use: Yes    Alcohol/week: 2.0 - 3.0 standard drinks of alcohol    Types: 2 - 3 Standard drinks or equivalent per week    Comment: occassionally   Drug use: No   Sexual activity: Yes    Birth control/protection: None  Other Topics Concern   Not on file  Social History Narrative   Not on file   Social Determinants of Health   Financial Resource Strain: Not on file  Food Insecurity: Not on file  Transportation Needs: Not on file  Physical Activity: Not on file  Stress: Not on file  Social Connections: Not on file  Intimate Partner Violence: Not on file    Outpatient Medications Prior to Visit  Medication Sig Dispense Refill   sertraline (ZOLOFT) 50 MG tablet Take 50 mg by mouth daily.     venlafaxine XR (EFFEXOR XR) 75 MG 24 hr capsule Take 1 capsule (75 mg total) by mouth  daily with breakfast. (Patient not taking: Reported on 12/29/2021) 30 capsule 6   No facility-administered medications prior to visit.    Allergies  Allergen Reactions   Prednisone Rash and Other (See Comments)    Pt states "It makes me feel weird.    Review of Systems  Constitutional:  Positive for chills and fever.       (+) Body Aches  HENT:  Positive for sore throat.   Neurological:  Positive for dizziness.       Objective:    Physical Exam Constitutional:      General: She is not in acute distress.    Appearance: Normal appearance. She is not ill-appearing.  HENT:     Head: Normocephalic and atraumatic.     Right Ear: Tympanic membrane, ear canal and external ear normal.     Left Ear: Tympanic membrane, ear canal and external ear normal.     Mouth/Throat:     Pharynx: Posterior oropharyngeal  erythema (Mild) present. No oropharyngeal exudate.     Tonsils: No tonsillar exudate or tonsillar abscesses. 2+ on the right. 2+ on the left.  Eyes:     Extraocular Movements: Extraocular movements intact.     Pupils: Pupils are equal, round, and reactive to light.  Cardiovascular:     Rate and Rhythm: Normal rate and regular rhythm.     Heart sounds: Normal heart sounds. No murmur heard.    No gallop.  Pulmonary:     Effort: Pulmonary effort is normal. No respiratory distress.     Breath sounds: Normal breath sounds. No wheezing or rales.  Skin:    General: Skin is warm and dry.  Neurological:     Mental Status: She is alert and oriented to person, place, and time.  Psychiatric:        Mood and Affect: Mood normal.        Behavior: Behavior normal.        Judgment: Judgment normal.     BP (!) 136/91 (BP Location: Right Arm, Patient Position: Sitting, Cuff Size: Large)   Pulse (!) 132   Temp 98.7 F (37.1 C) (Oral)   Resp 18   Wt 246 lb (111.6 kg)   SpO2 99%   BMI 38.53 kg/m  Wt Readings from Last 3 Encounters:  12/29/21 246 lb (111.6 kg)  11/17/21 240 lb 9.6 oz (109.1 kg)  09/13/21 234 lb (106.1 kg)       Assessment & Plan:   Problem List Items Addressed This Visit       Unprioritized   Strep throat    New. I definitely think she is dehydrated with her tachycardia. We discussed ED for IV fluids versus aggressive oral hydration at home. She prefers the latter.     Pt is advised as follows:   Please start amoxicillin 3x daily for 10 days for strep. Change out your toothbrush in 24 hours. For pain you may use ibuprofen 600mg  every 6 hours as needed for pain and/or lidocaine syrup. Drink at least 2 liters of fluid today (preferably gatorade/water). If dizziness persists despite hydration or if heart rate remains >120 after hydration, please go to the ER.       Sore throat   Relevant Orders   POCT rapid strep A   POC Influenza A&B (Binax test)   Other Visit  Diagnoses     Body aches    -  Primary   Relevant Orders   POCT rapid strep A  POC Influenza A&B (Binax test)       Meds ordered this encounter  Medications   amoxicillin (AMOXIL) 500 MG capsule    Sig: Take 1 capsule (500 mg total) by mouth 3 (three) times daily for 10 days.    Dispense:  30 capsule    Refill:  0    Order Specific Question:   Supervising Provider    Answer:   Danise Edge A [[id number]]   lidocaine (XYLOCAINE) 2 % solution    Sig: Use as directed 15 mLs in the mouth or throat every 4 (four) hours as needed for mouth pain.    Dispense:  100 mL    Refill:  0    Order Specific Question:   Supervising Provider    Answer:   Danise Edge A [[id number]]    I, Lemont Fillers, NP, personally preformed the services described in this documentation.  All medical record entries made by the scribe were at my direction and in my presence.  I have reviewed the chart and discharge instructions (if applicable) and agree that the record reflects my personal performance and is accurate and complete. 12/29/2021   I,Amber Collins,acting as a scribe for Lemont Fillers, NP.,have documented all relevant documentation on the behalf of Lemont Fillers, NP,as directed by  Lemont Fillers, NP while in the presence of Lemont Fillers, NP.   Lemont Fillers, NP

## 2021-12-29 NOTE — Assessment & Plan Note (Signed)
New. I definitely think she is dehydrated with her tachycardia. We discussed ED for IV fluids versus aggressive oral hydration at home. She prefers the latter.     Pt is advised as follows:   Please start amoxicillin 3x daily for 10 days for strep. Change out your toothbrush in 24 hours. For pain you may use ibuprofen 600mg  every 6 hours as needed for pain and/or lidocaine syrup. Drink at least 2 liters of fluid today (preferably gatorade/water). If dizziness persists despite hydration or if heart rate remains >120 after hydration, please go to the ER.

## 2021-12-31 DIAGNOSIS — R1312 Dysphagia, oropharyngeal phase: Secondary | ICD-10-CM | POA: Diagnosis not present

## 2021-12-31 DIAGNOSIS — K219 Gastro-esophageal reflux disease without esophagitis: Secondary | ICD-10-CM | POA: Diagnosis not present

## 2021-12-31 DIAGNOSIS — H35139 Retinopathy of prematurity, stage 2, unspecified eye: Secondary | ICD-10-CM | POA: Diagnosis not present

## 2021-12-31 DIAGNOSIS — R638 Other symptoms and signs concerning food and fluid intake: Secondary | ICD-10-CM | POA: Diagnosis not present

## 2021-12-31 LAB — POCT RAPID STREP A (OFFICE): Rapid Strep A Screen: POSITIVE — AB

## 2021-12-31 LAB — POC INFLUENZA A&B (BINAX/QUICKVUE)
Influenza A, POC: NEGATIVE
Influenza B, POC: NEGATIVE

## 2022-01-10 ENCOUNTER — Encounter (INDEPENDENT_AMBULATORY_CARE_PROVIDER_SITE_OTHER): Payer: BC Managed Care – PPO | Admitting: Family Medicine

## 2022-01-10 DIAGNOSIS — R052 Subacute cough: Secondary | ICD-10-CM

## 2022-01-10 DIAGNOSIS — F4323 Adjustment disorder with mixed anxiety and depressed mood: Secondary | ICD-10-CM

## 2022-01-10 MED ORDER — SERTRALINE HCL 50 MG PO TABS: 50.0000 mg | ORAL_TABLET | Freq: Every day | ORAL | 3 refills | Status: DC

## 2022-01-10 MED ORDER — HYDROCODONE BIT-HOMATROP MBR 5-1.5 MG/5ML PO SOLN: 5.0000 mL | Freq: Three times a day (TID) | ORAL | 0 refills | Status: AC | PRN

## 2022-01-10 MED ORDER — AZITHROMYCIN 250 MG PO TABS
ORAL_TABLET | ORAL | 0 refills | Status: AC
Start: 2022-01-10 — End: 2022-01-15

## 2022-01-10 NOTE — Telephone Encounter (Signed)
Called pt- she is doing great on sertraline 50, will refill for her Treated for strep with amox 2 weeks ago ST is better,but she now has a cough which can be productive of green mucus.  The cough seems to be getting worse and she cannot sleep due to cough She does not have a fever and otherwise does not feel very ill  Offered her an appt today but she cannot make it Called in zpack and hycodan- cautioned regarding sedation  She will contact me if not feeling better in the next few days- Sooner if worse.   Please see the MyChart message reply(ies) for my assessment and plan.  The patient gave consent for this Medical Advice Message and is aware that it may result in a bill to their insurance company as well as the possibility that this may result in a co-payment or deductible. They are an established patient, but are not seeking medical advice exclusively about a problem treated during an in person or video visit in the last 7 days. I did not recommend an in person or video visit within 7 days of my reply.  I spent a total of 7 minutes cumulative time within 7 days through [employer] Abbe Amsterdam, MD

## 2022-01-30 DIAGNOSIS — R1312 Dysphagia, oropharyngeal phase: Secondary | ICD-10-CM | POA: Diagnosis not present

## 2022-01-30 DIAGNOSIS — R638 Other symptoms and signs concerning food and fluid intake: Secondary | ICD-10-CM | POA: Diagnosis not present

## 2022-01-30 DIAGNOSIS — H35139 Retinopathy of prematurity, stage 2, unspecified eye: Secondary | ICD-10-CM | POA: Diagnosis not present

## 2022-01-30 DIAGNOSIS — K219 Gastro-esophageal reflux disease without esophagitis: Secondary | ICD-10-CM | POA: Diagnosis not present

## 2022-03-02 DIAGNOSIS — K219 Gastro-esophageal reflux disease without esophagitis: Secondary | ICD-10-CM | POA: Diagnosis not present

## 2022-03-02 DIAGNOSIS — H35139 Retinopathy of prematurity, stage 2, unspecified eye: Secondary | ICD-10-CM | POA: Diagnosis not present

## 2022-03-02 DIAGNOSIS — R638 Other symptoms and signs concerning food and fluid intake: Secondary | ICD-10-CM | POA: Diagnosis not present

## 2022-03-02 DIAGNOSIS — R1312 Dysphagia, oropharyngeal phase: Secondary | ICD-10-CM | POA: Diagnosis not present

## 2022-03-20 ENCOUNTER — Encounter: Payer: Self-pay | Admitting: Family Medicine

## 2022-03-23 NOTE — Progress Notes (Deleted)
[place] at [facility] [address] [place], [place] [address] [phone]  Date:  03/24/2022   Name:  Katherine [name]   DOB:  1990/09/28   MRN:  [id number]  PCP:  Darreld Mclean, MD    Chief Complaint: No chief complaint on file.   History of Present Illness:  Katherine [name] is a 31 y.o. very pleasant female patient who presents with the following:  Patient seen today to discuss potentially going on a GLP-1 agonist for weight loss History of preeclampsia, obesity, anxiety Most recent visit with myself was in May She delivered a baby girl in August of last year  Patient Active Problem List   Diagnosis Date Noted   Strep throat 12/29/2021   Sore throat 04/22/2021   Nasal congestion 04/22/2021   Sinus pressure 04/22/2021   Preeclampsia, severe 03/01/2021   IUGR (intrauterine growth restriction) affecting care of mother 02/24/2021   Transient hypertension of pregnancy 02/18/2021   Overweight 01/14/2016    Past Medical History:  Diagnosis Date   Anxiety    Asthma    Depression    Elevated blood pressure reading    Frequent headaches    Recurrent UTI     Past Surgical History:  Procedure Laterality Date   CESAREAN SECTION N/A 03/01/2021   Procedure: CESAREAN SECTION;  Surgeon: Carlyon Shadow, MD;  [facility]: [facility];  Service: Obstetrics;  Laterality: N/A;   DILATION AND EVACUATION N/A 02/11/2020   Procedure: DILATATION AND EVACUATION;  Surgeon: Linda Hedges, DO;  Location: [place];  Service: Gynecology;  Laterality: N/A;    Social History   Tobacco Use   Smoking status: Never   Smokeless tobacco: Never  Vaping Use   Vaping Use: Never used  Substance Use Topics   Alcohol use: Yes    Alcohol/week: 2.0 - 3.0 standard drinks of alcohol    Types: 2 - 3 Standard drinks or equivalent per week    Comment: occassionally   Drug use: No    Family History  Problem Relation Age of Onset    Hypertension Father    Hypertension Brother     Allergies  Allergen Reactions   Prednisone Rash and Other (See Comments)    Pt states "It makes me feel weird.    Medication list has been reviewed and updated.  Current Outpatient Medications on File Prior to Visit  Medication Sig Dispense Refill   lidocaine (XYLOCAINE) 2 % solution Use as directed 15 mLs in the mouth or throat every 4 (four) hours as needed for mouth pain. 100 mL 0   sertraline (ZOLOFT) 50 MG tablet Take 1 tablet (50 mg total) by mouth daily. 90 tablet 3   No current facility-administered medications on file prior to visit.    Review of Systems:  ***  Physical Examination: There were no vitals filed for this visit. There were no vitals filed for this visit. There is no height or weight on file to calculate BMI. Ideal Body Weight:    ***  Assessment and Plan: ***  Signed Lamar Blinks, MD

## 2022-03-24 ENCOUNTER — Telehealth (INDEPENDENT_AMBULATORY_CARE_PROVIDER_SITE_OTHER): Payer: BC Managed Care – PPO | Admitting: Family Medicine

## 2022-03-24 ENCOUNTER — Telehealth: Payer: Self-pay

## 2022-03-24 ENCOUNTER — Ambulatory Visit: Payer: BC Managed Care – PPO | Admitting: Family Medicine

## 2022-03-24 DIAGNOSIS — Z6838 Body mass index (BMI) 38.0-38.9, adult: Secondary | ICD-10-CM

## 2022-03-24 MED ORDER — OZEMPIC (0.25 OR 0.5 MG/DOSE) 2 MG/3ML ~~LOC~~ SOPN: 0.2500 mg | PEN_INJECTOR | SUBCUTANEOUS | 1 refills | Status: DC

## 2022-03-24 NOTE — Progress Notes (Signed)
[place] at [facility] [address] [place], [place] [address] [phone]  Date:  03/24/2022   Name:  Katherine [name]   DOB:  Jun 03, 1991   MRN:  [id number]  PCP:  Darreld Mclean, MD    Chief Complaint: No chief complaint on file.   History of Present Illness:  Katherine [name] is a 31 y.o. very pleasant female patient who presents with the following:  Virtual visit today to discuss using a GLP-1 for weight loss Patient location is home, my location is office.  Patient identity confirmed with 2 factors, she gives consent for virtual visit today.  The patient and myself are present on the call today  Per patient, her insurance potentially will cover Ozempic or Mounjaro-they do not appear to cover Saxenda or [facility] She has no contraindications to GLP-1 use, she is not currently pregnant or breast-feeding  Pt notes her current weight is approximately 246lb  She is trying to lose weight several times on her own, oftentimes he will lose about 10 pounds with them will gain it back.  She has tried several diet and exercise programs  She did have preeclampsia during pregnancy so is at risk for high blood pressure  Lab Results  Component Value Date   HGBA1C 5.4 11/17/2021     Wt Readings from Last 3 Encounters:  12/29/21 246 lb (111.6 kg)  11/17/21 240 lb 9.6 oz (109.1 kg)  09/13/21 234 lb (106.1 kg)   12/29/21 Most recent BMI in June= 38.5  BP Readings from Last 3 Encounters:  12/29/21 (!) 136/91  11/17/21 110/80  09/13/21 136/78     Patient Active Problem List   Diagnosis Date Noted   Strep throat 12/29/2021   Sore throat 04/22/2021   Nasal congestion 04/22/2021   Sinus pressure 04/22/2021   Preeclampsia, severe 03/01/2021   IUGR (intrauterine growth restriction) affecting care of mother 02/24/2021   Transient hypertension of pregnancy 02/18/2021   Overweight 01/14/2016    Past Medical History:   Diagnosis Date   Anxiety    Asthma    Depression    Elevated blood pressure reading    Frequent headaches    Recurrent UTI     Past Surgical History:  Procedure Laterality Date   CESAREAN SECTION N/A 03/01/2021   Procedure: CESAREAN SECTION;  Surgeon: Carlyon Shadow, MD;  [facility]: [facility];  Service: Obstetrics;  Laterality: N/A;   DILATION AND EVACUATION N/A 02/11/2020   Procedure: DILATATION AND EVACUATION;  Surgeon: Linda Hedges, DO;  Location: [place];  Service: Gynecology;  Laterality: N/A;    Social History   Tobacco Use   Smoking status: Never   Smokeless tobacco: Never  Vaping Use   Vaping Use: Never used  Substance Use Topics   Alcohol use: Yes    Alcohol/week: 2.0 - 3.0 standard drinks of alcohol    Types: 2 - 3 Standard drinks or equivalent per week    Comment: occassionally   Drug use: No    Family History  Problem Relation Age of Onset   Hypertension Father    Hypertension Brother     Allergies  Allergen Reactions   Prednisone Rash and Other (See Comments)    Pt states "It makes me feel weird.    Medication list has been reviewed and updated.  Current Outpatient Medications on File Prior to Visit  Medication Sig Dispense Refill   lidocaine (XYLOCAINE) 2 % solution  Use as directed 15 mLs in the mouth or throat every 4 (four) hours as needed for mouth pain. 100 mL 0   sertraline (ZOLOFT) 50 MG tablet Take 1 tablet (50 mg total) by mouth daily. 90 tablet 3   No current facility-administered medications on file prior to visit.    Review of Systems:  As per HPI- otherwise negative.   Physical Examination: There were no vitals filed for this visit. There were no vitals filed for this visit. There is no height or weight on file to calculate BMI. Ideal Body Weight:    Patient observed via video monitor.  She looks well, her normal self.  No distress or shortness of breath is noted  Assessment and Plan: Class 2 severe obesity due to excess  calories with serious comorbidity and body mass index (BMI) of 38.0 to 38.9 in adult [facility]) - Plan: Semaglutide,0.25 or 0.5MG /DOS, (OZEMPIC, 0.25 OR 0.5 MG/DOSE,) 2 MG/3ML SOPN  Patient seen today to discuss using a GLP-1 medication for weight management.  Patient with history of preeclampsia and elevated blood pressure.  We will have her start Ozempic 0.25 mg weekly for 1 month, then increase to 0.5.  We went over use of this medication, most common side effects, and titration as needed She is welcomed to come to the office for teaching session if needed-patient self-administered hormone shots during pregnancy so she feels fairly comfortable giving herself an injection  I have asked her to please see me closely about her progress, we can titrate her medication as needed  Video monitor used for duration of visit today  Signed Abbe Amsterdam, MD

## 2022-03-24 NOTE — Telephone Encounter (Signed)
PA initiated via Covermymeds; KEY; [id number]. Awaiting determination.

## 2022-03-28 ENCOUNTER — Encounter: Payer: Self-pay | Admitting: Family Medicine

## 2022-03-28 MED ORDER — WEGOVY 0.25 MG/0.5ML ~~LOC~~ SOAJ: SUBCUTANEOUS | 3 refills | Status: DC

## 2022-03-28 NOTE — Telephone Encounter (Signed)
PA denied. Awaiting denial information.  °

## 2022-03-28 NOTE — Telephone Encounter (Signed)
Explanation of Denial:    "This medication is approved for members with Type 2 diabetes (a condition with high blood sugar)."

## 2022-03-30 ENCOUNTER — Telehealth: Payer: Self-pay

## 2022-03-30 ENCOUNTER — Encounter: Payer: Self-pay | Admitting: Family Medicine

## 2022-03-30 NOTE — Telephone Encounter (Signed)
[facility] (Key: [id number]) Rx #: [id number] WIOXBD 0.25MG /0.5ML auto-injectors   Form [employer] [place] Commercial Electronic Request Form (CB)  Your prior authorization request has been denied. COMPLETE APPEAL Your request for prior authorization was denied, but an appeal is available for your patient. For assistance, contact our support team at [phone].  Reason for Denial: "The requested service is not a covered benefit per your benefit booklet or plan documents."

## 2022-04-19 ENCOUNTER — Ambulatory Visit: Payer: BC Managed Care – PPO | Admitting: Pharmacist

## 2022-04-19 NOTE — Progress Notes (Signed)
Pharmacy Note  04/19/2022 Name: Katherine [name] MRN: [id number] DOB: 1990-11-06  Subjective: Katherine [name] is a 31 y.o. year old female who is a primary care patient of Copland, Gay Filler, MD. Clinical Pharmacist Practitioner referral was placed to assist with medication management regarding weight management medication.  Engaged with patient by telephone for initial visit today.  Patient is interested in weight loss and would like medication assistance. She had tried diet changes in the past but had only bee successful in losing about 15 to 20lbs and then gains weight back. Current weight is 255lbs.  Height = 5\' 7"  BMI = 39.9 She is about 14 months post partum. Her daughter [place] was born at 22 week and spent several weeks in the NICU. Patient has pre eclampsia which contributed to early delivery. She did not know if she had gestational diabetes. She had not had gestational diabetes testing prior to early delivery.   She has history of PCOS and difficulty conceiving.06/02/2017 she had elevated testosterone at 80.18 and elevated prolactin at 31.6. FSH and LH were WNL.  A1c and lipids have been WNL  Only current medication is sertraline 50mg  daily.   Objective: Review of patient status, including review of consultants reports, laboratory and other test data, was performed as part of comprehensive evaluation and provision of chronic care management services.   Lab Results  Component Value Date   CREATININE 0.72 11/17/2021   CREATININE 0.86 03/02/2021   CREATININE 0.55 03/01/2021    Lab Results  Component Value Date   HGBA1C 5.4 11/17/2021       Component Value Date/Time   CHOL 180 11/17/2021 0851   TRIG 100.0 11/17/2021 0851   HDL 53.90 11/17/2021 0851   CHOLHDL 3 11/17/2021 0851   VLDL 20.0 11/17/2021 0851   LDLCALC 106 (H) 11/17/2021 0851   LDLCALC 96 04/27/2020 1144     Clinical ASCVD: No  The ASCVD Risk score (Arnett DK, et al., 2019) failed to  calculate for the following reasons:   The 2019 ASCVD risk score is only valid for ages 13 to 44    BP Readings from Last 3 Encounters:  12/29/21 (!) 136/91  11/17/21 110/80  09/13/21 136/78     Allergies  Allergen Reactions   Prednisone Rash and Other (See Comments)    Pt states "It makes me feel weird.    Medications Reviewed Today     Reviewed by Legrand Rams, CMA (Certified Medical Assistant) on 03/24/22 at 1317  Med List Status: <None>   Medication Order Taking? Sig Documenting Provider Last Dose Status Informant  lidocaine (XYLOCAINE) 2 % solution [id number]  Use as directed 15 mLs in the mouth or throat every 4 (four) hours as needed for mouth pain. Debbrah Alar, NP  Active   sertraline (ZOLOFT) 50 MG tablet [id number] Yes Take 1 tablet (50 mg total) by mouth daily. Copland, Gay Filler, MD Taking Active             Patient Active Problem List   Diagnosis Date Noted   Strep throat 12/29/2021   Sore throat 04/22/2021   Nasal congestion 04/22/2021   Sinus pressure 04/22/2021   Preeclampsia, severe 03/01/2021   IUGR (intrauterine growth restriction) affecting care of mother 02/24/2021   Transient hypertension of pregnancy 02/18/2021   Overweight 01/14/2016     Medication Assistance:   prior authorization needed for weight loss medication   Assessment / Plan:  Obesity Class 2 (BMI 39.9) Called [employer] to  ask about coverage of weight loss medications. They have a request for prior authorization on file that was denied. [employer] is faxing over appeal for [employer]. I will complete, however it does look like all weight loss medications are excluded from her plan. They also only cover Ozempic, Victoza, Trulicity and Mounjaro for patient with a diagnosis of type 2 DM.   There are discount cards available for [facility] and Saxenda but they only lower price by about $250 per month for commercial plans that cover the medication and about $500 per month if you have commercial  insurance and the medication is not covered. This would still leave about $1000 / month for patient to pay out of pocket.  No current medication assistance program for weight loss medications.   Could consider changing sertraline to bupropion which might help patient with cravings / weight loss. Bupropion is part of Contrave (Bupropion + naltrexone) which is a weight loss medication. Will try for [facility] appeal and if denied discuss this option with provider.    Henrene Pastor, PharmD Clinical Pharmacist [facility]  - [facility] [phone]

## 2022-04-25 ENCOUNTER — Encounter: Payer: Self-pay | Admitting: Pharmacist

## 2022-04-29 ENCOUNTER — Telehealth: Payer: Self-pay | Admitting: Pharmacist

## 2022-04-29 NOTE — Telephone Encounter (Signed)
Called [employer] regarding appeal for [name]. Received fax Monday but was only consent form for patient to sign. Katherine [name] has signed but need appeal form. Spoke to [employer] and appeal form sent. Completed and forwarded to PCP to review and sign.

## 2022-05-12 ENCOUNTER — Telehealth: Payer: Self-pay | Admitting: *Deleted

## 2022-05-12 ENCOUNTER — Telehealth: Payer: Self-pay | Admitting: Pharmacist

## 2022-05-12 DIAGNOSIS — F4323 Adjustment disorder with mixed anxiety and depressed mood: Secondary | ICD-10-CM

## 2022-05-12 NOTE — Telephone Encounter (Signed)
Who Is Calling Patient / Member / Family / Caregiver Call Type Triage / Clinical Relationship To Patient Self Return Phone Number [phone] (Primary) Chief Complaint CHEST PAIN - pain, pressure, heaviness or tightness Reason for Call Symptomatic / Request for Health Information Initial Comment Caller trans from office caller states her blood pressure is elevated 148/95 along with chest pains and left arm tingling Translation No Nurse Assessment Nurse: Suezanne Jacquet, RN, Riley Lam Date/Time (Eastern Time): 05/12/2022 10:07:12 AM Confirm and document reason for call. If symptomatic, describe symptoms. ---caller states her blood pressure is elevated 148/95 last night along with chest pains and left arm tingling. Began last night. Chest no longer hurting this morning but arm is still tingling. Hx of preclampsia but bp went back to normal last year in August.  Final Disposition 05/12/2022 10:12:17 AM Go to ED Now Yes Suezanne Jacquet, RN, [place] Spaniel Disagree/Comply Comply Caller Understands Yes PreDisposition InappropriateToAsk Care Advice Given Per Guideline GO TO ED NOW: * Leave now. Drive carefully. * Another adult should drive. CARE ADVICE given per Chest Pain (Adult) guideline.

## 2022-05-12 NOTE — Telephone Encounter (Signed)
Pt advised to go to the ED

## 2022-05-12 NOTE — Telephone Encounter (Signed)
Called [employer] to check on appeal that was submitted by fax 04/29/2022 for [facility]  [employer] representative confirmed that appeal was received and is in the process of being reviewed.  She states appeal process can take up to 30 business days. Decision will be made by 06/01/2022.

## 2022-05-19 ENCOUNTER — Encounter: Payer: BC Managed Care – PPO | Admitting: Family Medicine

## 2022-05-30 NOTE — Telephone Encounter (Signed)
Received letter from [facility] - appeal for [place] coverage was denied. Patient's plan does not cover any weight loss therapy.

## 2022-05-30 NOTE — Addendum Note (Signed)
Addended by: Henrene Pastor B on: 05/30/2022 01:15 PM   Modules accepted: Orders

## 2022-05-30 NOTE — Telephone Encounter (Signed)
Received letter.

## 2022-06-01 MED ORDER — BUPROPION HCL ER (SR) 100 MG PO TB12: ORAL_TABLET | ORAL | 0 refills | Status: DC

## 2022-06-01 NOTE — Telephone Encounter (Signed)
Discussed trial of buproprion in place of sertraline with PCP - Dr Patsy Lager has approved trial of bupropion ER 100gm daily for 1 week, then increase to 200mg  daily for 1 week. Will try to titrate up to 300mg  daily as tolerated.  Patient will lower dose of sertraline to 25mg  daily for the first week she take bupropion and then stop.

## 2022-06-01 NOTE — Patient Instructions (Incomplete)
It was good to see you again today, happy holidays!    Keep an eye on your BP at home- maybe check twice a week- if routinely higher than 135/85 please let me know!  Keep [place] posted about how the wellbutrin is working for you  Let's check back in about 6 months and do labwork

## 2022-06-01 NOTE — Progress Notes (Unsigned)
[facility] at [facility] [address] [place], [place] [address] [phone]  Date:  06/02/2022   Name:  DARSI TIEN   DOB:  January 13, 1991   MRN:  [id number]  PCP:  Pearline Cables, MD    Chief Complaint: No chief complaint on file.   History of Present Illness:  LEEASIA SECRIST is a 31 y.o. very pleasant female patient who presents with the following:  Patient seen today for physical exam Most recent visit with myself was a virtual visit in September We have been trying to get a GLP-1 approved for weight loss, but without success.  My pharmacist Dr. Henrene Pastor has been helping with this, she plans to try the patient on Wellbutrin for treatment of weight loss as well as depression  History of preeclampsia during recent pregnancy  Recommend COVID booster Flu shot Pap smear Labs done in May  Patient Active Problem List   Diagnosis Date Noted   Strep throat 12/29/2021   Sore throat 04/22/2021   Nasal congestion 04/22/2021   Sinus pressure 04/22/2021   Preeclampsia, severe 03/01/2021   IUGR (intrauterine growth restriction) affecting care of mother 02/24/2021   Transient hypertension of pregnancy 02/18/2021   Overweight 01/14/2016    Past Medical History:  Diagnosis Date   Anxiety    Asthma    Depression    Elevated blood pressure reading    Frequent headaches    Recurrent UTI     Past Surgical History:  Procedure Laterality Date   CESAREAN SECTION N/A 03/01/2021   Procedure: CESAREAN SECTION;  Surgeon: Lyn Henri, MD;  [facility]: [facility];  Service: Obstetrics;  Laterality: N/A;   DILATION AND EVACUATION N/A 02/11/2020   Procedure: DILATATION AND EVACUATION;  Surgeon: Mitchel Honour, DO;  Location: [facility] OR;  Service: Gynecology;  Laterality: N/A;    Social History   Tobacco Use   Smoking status: Never   Smokeless tobacco: Never  Vaping Use   Vaping Use: Never used  Substance Use Topics    Alcohol use: Yes    Alcohol/week: 2.0 - 3.0 standard drinks of alcohol    Types: 2 - 3 Standard drinks or equivalent per week    Comment: occassionally   Drug use: No    Family History  Problem Relation Age of Onset   Hypertension Father    Hypertension Brother     Allergies  Allergen Reactions   Prednisone Rash and Other (See Comments)    Pt states "It makes me feel weird.    Medication list has been reviewed and updated.  Current Outpatient Medications on File Prior to Visit  Medication Sig Dispense Refill   sertraline (ZOLOFT) 50 MG tablet Take 1 tablet (50 mg total) by mouth daily. 90 tablet 3   No current facility-administered medications on file prior to visit.    Review of Systems:  As per HPI- otherwise negative.   Physical Examination: There were no vitals filed for this visit. There were no vitals filed for this visit. There is no height or weight on file to calculate BMI. Ideal Body Weight:    GEN: no acute distress. HEENT: Atraumatic, Normocephalic.  Ears and Nose: No external deformity. CV: RRR, No M/G/R. No JVD. No thrill. No extra heart sounds. PULM: CTA B, no wheezes, crackles, rhonchi. No retractions. No resp. distress. No accessory muscle use. ABD: S, NT, ND, +BS. No rebound. No HSM. EXTR: No c/c/e PSYCH:  Normally interactive. Conversant.    Assessment and Plan: ***  Signed Lamar Blinks, MD

## 2022-06-01 NOTE — Addendum Note (Signed)
Addended by: Henrene Pastor B on: 06/01/2022 03:31 PM   Modules accepted: Orders

## 2022-06-02 ENCOUNTER — Ambulatory Visit (INDEPENDENT_AMBULATORY_CARE_PROVIDER_SITE_OTHER): Payer: BC Managed Care – PPO | Admitting: Family Medicine

## 2022-06-02 VITALS — BP 124/80 | HR 97 | Temp 97.6°F | Resp 18 | Wt 225.4 lb

## 2022-06-02 DIAGNOSIS — Z6838 Body mass index (BMI) 38.0-38.9, adult: Secondary | ICD-10-CM

## 2022-06-02 DIAGNOSIS — Z0000 Encounter for general adult medical examination without abnormal findings: Secondary | ICD-10-CM | POA: Diagnosis not present

## 2022-06-02 DIAGNOSIS — Z8759 Personal history of other complications of pregnancy, childbirth and the puerperium: Secondary | ICD-10-CM

## 2022-06-02 DIAGNOSIS — F4323 Adjustment disorder with mixed anxiety and depressed mood: Secondary | ICD-10-CM | POA: Diagnosis not present

## 2022-06-06 DIAGNOSIS — Z124 Encounter for screening for malignant neoplasm of cervix: Secondary | ICD-10-CM | POA: Diagnosis not present

## 2022-06-06 DIAGNOSIS — Z6841 Body Mass Index (BMI) 40.0 and over, adult: Secondary | ICD-10-CM | POA: Diagnosis not present

## 2022-06-06 DIAGNOSIS — Z1151 Encounter for screening for human papillomavirus (HPV): Secondary | ICD-10-CM | POA: Diagnosis not present

## 2022-06-06 DIAGNOSIS — Z01419 Encounter for gynecological examination (general) (routine) without abnormal findings: Secondary | ICD-10-CM | POA: Diagnosis not present

## 2022-06-06 LAB — RESULTS CONSOLE HPV: CHL HPV: NEGATIVE

## 2022-06-06 LAB — HM PAP SMEAR

## 2022-06-15 ENCOUNTER — Encounter: Payer: Self-pay | Admitting: Family Medicine

## 2022-06-22 ENCOUNTER — Encounter (INDEPENDENT_AMBULATORY_CARE_PROVIDER_SITE_OTHER): Payer: BC Managed Care – PPO | Admitting: Family Medicine

## 2022-06-22 MED ORDER — OSELTAMIVIR PHOSPHATE 75 MG PO CAPS: 75.0000 mg | ORAL_CAPSULE | Freq: Every day | ORAL | 0 refills | Status: DC

## 2022-06-23 DIAGNOSIS — J111 Influenza due to unidentified influenza virus with other respiratory manifestations: Secondary | ICD-10-CM

## 2022-06-23 NOTE — Telephone Encounter (Signed)
Please see the MyChart message reply(ies) for my assessment and plan.  The patient gave consent for this Medical Advice Message and is aware that it may result in a bill to their insurance company as well as the possibility that this may result in a co-payment or deductible. They are an established patient, but are not seeking medical advice exclusively about a problem treated during an in person or video visit in the last 7 days. I did not recommend an in person or video visit within 7 days of my reply.  I spent a total of 10 minutes cumulative time within 7 days through MyChart messaging Maleena Eddleman, MD  

## 2022-06-28 ENCOUNTER — Other Ambulatory Visit: Payer: Self-pay | Admitting: Family Medicine

## 2022-07-10 ENCOUNTER — Encounter: Payer: Self-pay | Admitting: Family Medicine

## 2022-07-13 MED ORDER — WEGOVY 0.25 MG/0.5ML ~~LOC~~ SOAJ: SUBCUTANEOUS | 2 refills | Status: DC

## 2022-07-13 NOTE — Telephone Encounter (Signed)
Error

## 2022-07-29 ENCOUNTER — Ambulatory Visit: Payer: BC Managed Care – PPO | Admitting: Family Medicine

## 2022-08-08 ENCOUNTER — Encounter: Payer: Self-pay | Admitting: Nurse Practitioner

## 2022-08-08 ENCOUNTER — Ambulatory Visit: Payer: BC Managed Care – PPO | Admitting: Nurse Practitioner

## 2022-08-08 VITALS — BP 124/82 | HR 101 | Temp 97.1°F | Ht 67.0 in | Wt 252.0 lb

## 2022-08-08 DIAGNOSIS — J069 Acute upper respiratory infection, unspecified: Secondary | ICD-10-CM

## 2022-08-08 LAB — POCT INFLUENZA A/B
Influenza A, POC: NEGATIVE
Influenza B, POC: NEGATIVE

## 2022-08-08 LAB — POC COVID19 BINAXNOW: SARS Coronavirus 2 Ag: NEGATIVE

## 2022-08-08 MED ORDER — BENZONATATE 100 MG PO CAPS: 100.0000 mg | ORAL_CAPSULE | Freq: Three times a day (TID) | ORAL | 0 refills | Status: DC | PRN

## 2022-08-08 NOTE — Patient Instructions (Addendum)
Encourage adequate oral hydration. Use over-the-counter  cold" medicine  such as Dayquil/Nyquil for cough and congestion.  Use benzonatate or mucinex DM or Robitussin  or delsym for cough.  You can use plain "Tylenol" or "Advil" for fever, chills and achyness. Use cool mist humidifier at bedtime to help with nasal congestion and cough.  Cold/cough medications may have tylenol or ibuprofen or guaifenesin or dextromethorphan in them, so be careful not to take beyond the recommended dose for each of these medications.   "Common cold" symptoms are usually triggered by a virus.  The antibiotics are usually not necessary. On average, a" viral cold" illness may take 7-10 days to resolve. Please, make an appointment if you are not better or if you're worse.

## 2022-08-08 NOTE — Progress Notes (Signed)
Established Patient Visit  Patient: Katherine [name]   DOB: Feb 10, 1991   32 y.o. Female  MRN: [id number] Visit Date: 08/08/2022  Subjective:    Chief Complaint  Patient presents with   Acute Visit    Cough and chest congestion, fever last night-home covid test negative   URI  This is a new problem. The current episode started in the past 7 days. The problem has been unchanged. The maximum temperature recorded prior to her arrival was 100.4 - 100.9 F. The fever has been present for Less than 1 day. Associated symptoms include congestion, coughing, headaches, joint pain, rhinorrhea, sinus pain, sneezing and a sore throat. Pertinent negatives include no abdominal pain, chest pain, diarrhea, dysuria, ear pain, joint swelling, nausea, neck pain, plugged ear sensation, rash, swollen glands, vomiting or wheezing. She has tried acetaminophen and increased fluids for the symptoms. The treatment provided mild relief.   Reviewed medical, surgical, and social history today  Medications: Outpatient Medications Prior to Visit  Medication Sig   buPROPion ER (WELLBUTRIN SR) 100 MG 12 hr tablet TAKE 1 TABLET BY MOUTH DAILY FOR 7 DAYS THEN TAKE 1 TABLET BY MOUTH TWICE A DAY THEREAFTER (TAKE SECOND DOSE BEFORE 4PM)   [DISCONTINUED] oseltamivir (TAMIFLU) 75 MG capsule Take 1 capsule (75 mg total) by mouth daily. Take once daily for flu prevention. If flu symptoms develop can change to twice a day dosing   [DISCONTINUED] Semaglutide-Weight Management (WEGOVY) 0.25 MG/0.5ML SOAJ Take 0.25 mg weekly for 4 weeks, then increase to 0.5 mg weekly (Patient not taking: Reported on 08/08/2022)   [DISCONTINUED] sertraline (ZOLOFT) 50 MG tablet Take 0.5 tablets (25 mg total) by mouth daily. (Patient not taking: Reported on 08/08/2022)   No facility-administered medications prior to visit.   Reviewed past medical and social history.   ROS per HPI above      Objective:  BP 124/82 (BP Location: Right Arm)    [name] (!) 101   Temp (!) 97.1 F (36.2 C) (Temporal)   Ht 5\' 7"  (1.702 m)   Wt 252 lb (114.3 kg)   LMP 07/26/2022 (Approximate)   SpO2 98%   Breastfeeding No   BMI 39.47 kg/m      Physical Exam Vitals and nursing note reviewed.  Cardiovascular:     Rate and Rhythm: Normal rate and regular rhythm.     Pulses: Normal pulses.     Heart sounds: Normal heart sounds.  Pulmonary:     Effort: Pulmonary effort is normal.     Breath sounds: Normal breath sounds.  Lymphadenopathy:     Cervical: No cervical adenopathy.  Neurological:     Mental Status: She is alert and oriented to person, place, and time.     Results for orders placed or performed in visit on 08/08/22  POC COVID-19  Result Value Ref Range   SARS Coronavirus 2 Ag Negative Negative  POCT Influenza A/B  Result Value Ref Range   Influenza A, POC Negative Negative   Influenza B, POC Negative Negative      Assessment & Plan:    Problem List Items Addressed This Visit   None Visit Diagnoses     Viral URI    -  Primary   Relevant Medications   benzonatate (TESSALON) 100 MG capsule   Other Relevant Orders   POC COVID-19 (Completed)   POCT Influenza A/B (Completed)     Encourage adequate oral  hydration. Use over-the-counter  cold" medicine  such as Dayquil/Nyquil for cough and congestion.  Use benzonatate or mucinex DM or Robitussin  or delsym for cough.  You can use plain "Tylenol" or "Advil" for fever, chills and achyness. Use cool mist humidifier at bedtime to help with nasal congestion and cough. Cold/cough medications may have tylenol or ibuprofen or guaifenesin or dextromethorphan in them, so be careful not to take beyond the recommended dose for each of these medications.  "Common cold" symptoms are usually triggered by a virus.  The antibiotics are usually not necessary. On average, a" viral cold" illness may take 7-10 days to resolve. Please, make an appointment if you are not better or if you're worse.    Return if symptoms worsen or fail to improve.     Wilfred Lacy, NP

## 2022-08-13 NOTE — Patient Instructions (Incomplete)
It was good to see you again today!  I highly recommend a seasonal flu shot in COVID-19 immunizations if not done already!  Let's taper off the wellbutrin- take every other day for 3 doses or so, then stop and change to the venlafaxine/ effexor once daily Let me know how this works for you!   I will be in touch with your labs asap  Try and do some gentle stretches and heat for your back Use the muscle relaxer as needed for back spasm  Please let me know how your back is doing in the next couple of weeks

## 2022-08-13 NOTE — Progress Notes (Unsigned)
[place] at [facility] [address] [place], [place] [address] [phone]  Date:  08/15/2022   Name:  Katherine [name]   DOB:  27-Aug-1990   MRN:  [id number]  PCP:  Darreld Mclean, MD    Chief Complaint: No chief complaint on file.   History of Present Illness:  Katherine [name] is a 32 y.o. very pleasant female patient who presents with the following:  Patient seen today for medication management Most recent visit with myself was her physical exam in November History of preeclampsia during pregnancy with preterm delivery at 81 weeks, family history of hypertension We have tried to obtain a GLP-1 for her for weight loss without success.  Are using Wellbutrin 100 mg ER instead  She was seen at another location earlier this month with concern of acute illness-she tested negative for COVID and flu at that time  COVID-19 vaccine Pap smear Flu shot Lab work completed in May  Patient Active Problem List   Diagnosis Date Noted   Strep throat 12/29/2021   Sore throat 04/22/2021   Nasal congestion 04/22/2021   Sinus pressure 04/22/2021   Preeclampsia, severe 03/01/2021   IUGR (intrauterine growth restriction) affecting care of mother 02/24/2021   Transient hypertension of pregnancy 02/18/2021   Overweight 01/14/2016    Past Medical History:  Diagnosis Date   Anxiety    Asthma    Depression    Elevated blood pressure reading    Frequent headaches    Recurrent UTI     Past Surgical History:  Procedure Laterality Date   CESAREAN SECTION N/A 03/01/2021   Procedure: CESAREAN SECTION;  Surgeon: Carlyon Shadow, MD;  [facility]: [facility];  Service: Obstetrics;  Laterality: N/A;   DILATION AND EVACUATION N/A 02/11/2020   Procedure: DILATATION AND EVACUATION;  Surgeon: Linda Hedges, DO;  Location: [place];  Service: Gynecology;  Laterality: N/A;    Social History   Tobacco Use   Smoking status: Never   Smokeless  tobacco: Never  Vaping Use   Vaping Use: Never used  Substance Use Topics   Alcohol use: Yes    Alcohol/week: 2.0 - 3.0 standard drinks of alcohol    Types: 2 - 3 Standard drinks or equivalent per week    Comment: occassionally   Drug use: No    Family History  Problem Relation Age of Onset   Hypertension Father    Hypertension Brother     Allergies  Allergen Reactions   Prednisone Rash and Other (See Comments)    Pt states "It makes me feel weird.    Medication list has been reviewed and updated.  Current Outpatient Medications on File Prior to Visit  Medication Sig Dispense Refill   benzonatate (TESSALON) 100 MG capsule Take 1-2 capsules (100-200 mg total) by mouth 3 (three) times daily as needed. 30 capsule 0   buPROPion ER (WELLBUTRIN SR) 100 MG 12 hr tablet TAKE 1 TABLET BY MOUTH DAILY FOR 7 DAYS THEN TAKE 1 TABLET BY MOUTH TWICE A DAY THEREAFTER (TAKE SECOND DOSE BEFORE 4PM) 60 tablet 0   No current facility-administered medications on file prior to visit.    Review of Systems:  As per HPI- otherwise negative.   Physical Examination: There were no vitals filed for this visit. There were no vitals filed for this visit. There is no height or weight on file to calculate BMI. Ideal Body Weight:    GEN:  no acute distress. HEENT: Atraumatic, Normocephalic.  Ears and Nose: No external deformity. CV: RRR, No M/G/R. No JVD. No thrill. No extra heart sounds. PULM: CTA B, no wheezes, crackles, rhonchi. No retractions. No resp. distress. No accessory muscle use. ABD: S, NT, ND, +BS. No rebound. No HSM. EXTR: No c/c/e PSYCH: Normally interactive. Conversant.    Assessment and Plan: ***  Signed Lamar Blinks, MD

## 2022-08-15 ENCOUNTER — Encounter: Payer: Self-pay | Admitting: Family Medicine

## 2022-08-15 ENCOUNTER — Ambulatory Visit: Payer: BC Managed Care – PPO | Admitting: Family Medicine

## 2022-08-15 VITALS — BP 124/72 | HR 88 | Ht 67.0 in | Wt 249.4 lb

## 2022-08-15 DIAGNOSIS — R5383 Other fatigue: Secondary | ICD-10-CM

## 2022-08-15 DIAGNOSIS — Z131 Encounter for screening for diabetes mellitus: Secondary | ICD-10-CM | POA: Diagnosis not present

## 2022-08-15 DIAGNOSIS — Z130 Encounter for screening for diseases of the blood and blood-forming organs and certain disorders involving the immune mechanism: Secondary | ICD-10-CM

## 2022-08-15 DIAGNOSIS — M62830 Muscle spasm of back: Secondary | ICD-10-CM

## 2022-08-15 DIAGNOSIS — R300 Dysuria: Secondary | ICD-10-CM

## 2022-08-15 DIAGNOSIS — Z1329 Encounter for screening for other suspected endocrine disorder: Secondary | ICD-10-CM

## 2022-08-15 DIAGNOSIS — F4323 Adjustment disorder with mixed anxiety and depressed mood: Secondary | ICD-10-CM

## 2022-08-15 DIAGNOSIS — E559 Vitamin D deficiency, unspecified: Secondary | ICD-10-CM

## 2022-08-15 DIAGNOSIS — Z13220 Encounter for screening for lipoid disorders: Secondary | ICD-10-CM

## 2022-08-15 DIAGNOSIS — R7401 Elevation of levels of liver transaminase levels: Secondary | ICD-10-CM

## 2022-08-15 LAB — COMPREHENSIVE METABOLIC PANEL
ALT: 75 U/L — ABNORMAL HIGH (ref 0–35)
AST: 19 U/L (ref 0–37)
Albumin: 4 g/dL (ref 3.5–5.2)
Alkaline Phosphatase: 52 U/L (ref 39–117)
BUN: 10 mg/dL (ref 6–23)
CO2: 24 mEq/L (ref 19–32)
Calcium: 9.2 mg/dL (ref 8.4–10.5)
Chloride: 107 mEq/L (ref 96–112)
Creatinine, Ser: 0.78 mg/dL (ref 0.40–1.20)
GFR: 100.55 mL/min (ref >60.00)
Glucose, Bld: 100 mg/dL — ABNORMAL HIGH (ref 70–99)
Potassium: 4.4 mEq/L (ref 3.5–5.1)
Sodium: 139 mEq/L (ref 135–145)
Total Bilirubin: 0.5 mg/dL (ref 0.2–1.2)
Total Protein: 6.4 g/dL (ref 6.0–8.3)

## 2022-08-15 LAB — CBC
HCT: 39.6 % (ref 36.0–46.0)
Hemoglobin: 13.4 g/dL (ref 12.0–15.0)
MCHC: 33.7 g/dL (ref 30.0–36.0)
MCV: 85.9 fl (ref 78.0–100.0)
Platelets: 226 10*3/uL (ref 150.0–400.0)
RBC: 4.61 Mil/uL (ref 3.87–5.11)
RDW: 13.9 % (ref 11.5–15.5)
WBC: 4.7 10*3/uL (ref 4.0–10.5)

## 2022-08-15 LAB — LIPID PANEL
Cholesterol: 156 mg/dL (ref 0–200)
HDL: 38.8 mg/dL — ABNORMAL LOW (ref >39.00)
LDL Cholesterol: 93 mg/dL (ref 0–99)
NonHDL: 117.4
Total CHOL/HDL Ratio: 4
Triglycerides: 121 mg/dL (ref 0.0–149.0)
VLDL: 24.2 mg/dL (ref 0.0–40.0)

## 2022-08-15 LAB — IRON: Iron: 82 ug/dL (ref 42–145)

## 2022-08-15 LAB — VITAMIN D 25 HYDROXY (VIT D DEFICIENCY, FRACTURES): VITD: 17.42 ng/mL — ABNORMAL LOW (ref 30.00–100.00)

## 2022-08-15 LAB — TSH: TSH: 2.04 u[IU]/mL (ref 0.35–5.50)

## 2022-08-15 LAB — POCT URINE PREGNANCY: Preg Test, Ur: NEGATIVE

## 2022-08-15 LAB — HEMOGLOBIN A1C: Hgb A1c MFr Bld: 5.6 % (ref 4.6–6.5)

## 2022-08-15 MED ORDER — VENLAFAXINE HCL ER 75 MG PO CP24: 75.0000 mg | ORAL_CAPSULE | Freq: Every day | ORAL | 3 refills | Status: DC

## 2022-08-15 MED ORDER — VITAMIN D3 1.25 MG (50000 UT) PO CAPS: ORAL_CAPSULE | ORAL | 0 refills | Status: DC

## 2022-08-15 MED ORDER — METHOCARBAMOL 500 MG PO TABS: 500.0000 mg | ORAL_TABLET | Freq: Four times a day (QID) | ORAL | 1 refills | Status: DC

## 2022-08-18 ENCOUNTER — Telehealth: Payer: Self-pay | Admitting: Family Medicine

## 2022-08-18 ENCOUNTER — Other Ambulatory Visit (INDEPENDENT_AMBULATORY_CARE_PROVIDER_SITE_OTHER): Payer: BC Managed Care – PPO

## 2022-08-18 DIAGNOSIS — R300 Dysuria: Secondary | ICD-10-CM | POA: Diagnosis not present

## 2022-08-18 LAB — POCT URINALYSIS DIP (MANUAL ENTRY)
Bilirubin, UA: NEGATIVE
Glucose, UA: NEGATIVE mg/dL
Leukocytes, UA: NEGATIVE
Nitrite, UA: NEGATIVE
Protein Ur, POC: NEGATIVE mg/dL
Spec Grav, UA: 1.02 (ref 1.010–1.025)
Urobilinogen, UA: 0.2 E.U./dL
pH, UA: 6.5 (ref 5.0–8.0)

## 2022-08-18 MED ORDER — NITROFURANTOIN MONOHYD MACRO 100 MG PO CAPS: 100.0000 mg | ORAL_CAPSULE | Freq: Two times a day (BID) | ORAL | 0 refills | Status: DC

## 2022-08-18 NOTE — Telephone Encounter (Signed)
Pt states she was advised to recheck labs in two weeks. Did not see orders, but pt is already scheduled.

## 2022-08-18 NOTE — Telephone Encounter (Signed)
Orders are already in for hepatic function panel

## 2022-08-18 NOTE — Addendum Note (Signed)
Addended by: Lamar Blinks C on: 08/18/2022 04:02 PM   Modules accepted: Orders

## 2022-08-19 LAB — URINE CULTURE
MICRO NUMBER:: 14570226
SPECIMEN QUALITY:: ADEQUATE

## 2022-08-20 ENCOUNTER — Encounter: Payer: Self-pay | Admitting: Family Medicine

## 2022-08-20 NOTE — Telephone Encounter (Signed)
Received urine culture- message to pt  MICRO NUMBER: [id number]  SPECIMEN QUALITY: Adequate  Sample Source URINE, CLEAN CATCH  STATUS: FINAL  Result: Mixed genital flora isolated. These superficial bacteria are not indicative of a urinary tract infection. No further organism identification is warranted on this specimen. If clinically indicated, recollect clean-catch, mid-stream urine and transfer immediately to Urine Culture Transport Tube.

## 2022-08-27 ENCOUNTER — Other Ambulatory Visit: Payer: Self-pay | Admitting: Family Medicine

## 2022-08-29 ENCOUNTER — Other Ambulatory Visit: Payer: BC Managed Care – PPO

## 2022-08-30 ENCOUNTER — Encounter: Payer: Self-pay | Admitting: Family Medicine

## 2022-08-30 ENCOUNTER — Other Ambulatory Visit (INDEPENDENT_AMBULATORY_CARE_PROVIDER_SITE_OTHER): Payer: BC Managed Care – PPO

## 2022-08-30 DIAGNOSIS — R7401 Elevation of levels of liver transaminase levels: Secondary | ICD-10-CM

## 2022-08-30 LAB — HEPATIC FUNCTION PANEL
ALT: 11 U/L (ref 0–35)
AST: 13 U/L (ref 0–37)
Albumin: 4 g/dL (ref 3.5–5.2)
Alkaline Phosphatase: 54 U/L (ref 39–117)
Bilirubin, Direct: 0.2 mg/dL (ref 0.0–0.3)
Total Bilirubin: 0.8 mg/dL (ref 0.2–1.2)
Total Protein: 6.7 g/dL (ref 6.0–8.3)

## 2022-09-18 DIAGNOSIS — M79671 Pain in right foot: Secondary | ICD-10-CM | POA: Diagnosis not present

## 2022-09-18 DIAGNOSIS — S93401A Sprain of unspecified ligament of right ankle, initial encounter: Secondary | ICD-10-CM | POA: Diagnosis not present

## 2022-09-18 DIAGNOSIS — S93601A Unspecified sprain of right foot, initial encounter: Secondary | ICD-10-CM | POA: Diagnosis not present

## 2022-09-21 ENCOUNTER — Encounter: Payer: Self-pay | Admitting: Family Medicine

## 2022-09-23 ENCOUNTER — Encounter: Payer: Self-pay | Admitting: Family Medicine

## 2022-09-23 ENCOUNTER — Telehealth: Payer: Self-pay | Admitting: Family Medicine

## 2022-09-23 ENCOUNTER — Ambulatory Visit: Payer: BC Managed Care – PPO | Admitting: Family Medicine

## 2022-09-23 VITALS — BP 140/85 | HR 92 | Temp 98.2°F | Resp 18 | Ht 67.0 in | Wt 251.2 lb

## 2022-09-23 DIAGNOSIS — R21 Rash and other nonspecific skin eruption: Secondary | ICD-10-CM

## 2022-09-23 MED ORDER — VALACYCLOVIR HCL 1 G PO TABS: 1000.0000 mg | ORAL_TABLET | Freq: Three times a day (TID) | ORAL | 0 refills | Status: DC

## 2022-09-23 NOTE — Telephone Encounter (Signed)
Pt was seen today and was supposed to receive medication but it has not been called in. Pt wanted to know if it could be sent in before the weekend. She would like a call back to advise. HT on [employer].

## 2022-09-23 NOTE — Telephone Encounter (Signed)
Please see below.

## 2022-09-23 NOTE — Telephone Encounter (Signed)
Pt called back to check on medication she was supposed to receive today after her OV and does not want to go the whole weekend without taking it. Sending to DOD since provider that prescribed medication is gone for the day.

## 2022-09-23 NOTE — Telephone Encounter (Signed)
Note reviewed, the intention was to send in Valtrex for erythematous patch on at T5 dermatome distribution.  Prescription sent.  Please let the patient know.

## 2022-09-23 NOTE — Progress Notes (Signed)
   Acute Office Visit  Subjective:     Patient ID: Katherine [name], female    DOB: [birth year], 32 y.o.   MRN: [id number]  Chief Complaint  Patient presents with   skin issue    Pt suspects that she has been bitten by something.     HPI Patient is in today for rash.  Rash:  Patient reports that 2 days ago she noticed an achy/discomfort to her left side/trunk. When she later looked at the area she noticed, a red, raised rash/patch. Later she noticed another similar rash below the original. Reports the rash is achy, tender, uncomfortable, mildly itchy. She also feels the achy/itchy sensation wrapping around her torso, under left breast, but no rash to this area. Reports one of the rash patches did look like it had small blisters - reports the rashes are near her bra line, so she has switched to a looser/softer bra to prevent rubbing and irritation to the area. She has not noticed any drainage, surrounding erythema, edema, fevers, chills, body aches. No new foods, medications, detergents, skin scare products. Reports they have been playing outside more lately, but no other rashes.       ROS All review of systems negative except what is listed in the HPI      Objective:    BP (!) 140/85 (BP Location: Left Arm, Patient Position: Sitting, Cuff Size: Large)   Pulse 92   Temp 98.2 F (36.8 C) (Oral)   Resp 18   Ht 5\' 7"  (1.702 m)   Wt 251 lb 3.2 oz (113.9 kg)   SpO2 100%   BMI 39.34 kg/m    Physical Exam Vitals reviewed.  Constitutional:      Appearance: Normal appearance.  Skin:    Findings: Rash present.     Comments: Slightly raised, erythematous patches to left side ~T5 dermatome  Neurological:     Mental Status: She is alert.  Psychiatric:        Mood and Affect: Mood normal.        Behavior: Behavior normal.        Thought Content: Thought content normal.        Judgment: Judgment normal.     No results found for any visits on 09/23/22.      Assessment &  Plan:   Problem List Items Addressed This Visit   None Visit Diagnoses     Rash    -  Primary Description and presentation suspicious for shingles. Will try Valtrex. Recommended hydrocortisone cream, calamine lotion, Vaseline gauze, tylenol, ibuprofen.  Patient aware of signs/symptoms requiring further/urgent evaluation.        No orders of the defined types were placed in this encounter.   Return if symptoms worsen or fail to improve.  Terrilyn Saver, NP

## 2022-09-23 NOTE — Addendum Note (Signed)
Addended by: Colon Branch on: 09/23/2022 04:47 PM   Modules accepted: Orders

## 2022-09-23 NOTE — Telephone Encounter (Signed)
Called patient, and she is aware medication was sent in.

## 2022-10-12 DIAGNOSIS — Z3202 Encounter for pregnancy test, result negative: Secondary | ICD-10-CM | POA: Diagnosis not present

## 2022-10-12 DIAGNOSIS — N915 Oligomenorrhea, unspecified: Secondary | ICD-10-CM | POA: Diagnosis not present

## 2022-10-31 DIAGNOSIS — L918 Other hypertrophic disorders of the skin: Secondary | ICD-10-CM | POA: Diagnosis not present

## 2022-10-31 DIAGNOSIS — L218 Other seborrheic dermatitis: Secondary | ICD-10-CM | POA: Diagnosis not present

## 2022-11-01 NOTE — Progress Notes (Unsigned)
[facility] at [facility] [address] [place], [place] [address] [phone]  Date:  11/02/2022   Name:  Katherine [name]   DOB:  04/07/1991   MRN:  [id number]  PCP:  Pearline Cables, MD    Chief Complaint: No chief complaint on file.   History of Present Illness:  Katherine [name] is a 32 y.o. very pleasant female patient who presents with the following:  Pt seen today with concern of GI issues Last seen by myself in February  History of preeclampsia during pregnancy with preterm delivery at 28 weeks, family history of hypertension   At our visit in February she was struggling with anxiety and some depression- we changed her over from wellbutrin to effexor   Pap screening  Patient Active Problem List   Diagnosis Date Noted   Strep throat 12/29/2021   Sore throat 04/22/2021   Nasal congestion 04/22/2021   Sinus pressure 04/22/2021   Preeclampsia, severe 03/01/2021   IUGR (intrauterine growth restriction) affecting care of mother 02/24/2021   Transient hypertension of pregnancy 02/18/2021   Overweight 01/14/2016    Past Medical History:  Diagnosis Date   Anxiety    Asthma    Depression    Elevated blood pressure reading    Frequent headaches    Recurrent UTI     Past Surgical History:  Procedure Laterality Date   CESAREAN SECTION N/A 03/01/2021   Procedure: CESAREAN SECTION;  Surgeon: Lyn Henri, MD;  [facility]: [facility];  Service: Obstetrics;  Laterality: N/A;   DILATION AND EVACUATION N/A 02/11/2020   Procedure: DILATATION AND EVACUATION;  Surgeon: Mitchel Honour, DO;  Location: [facility] OR;  Service: Gynecology;  Laterality: N/A;    Social History   Tobacco Use   Smoking status: Never   Smokeless tobacco: Never  Vaping Use   Vaping Use: Never used  Substance Use Topics   Alcohol use: Yes    Alcohol/week: 2.0 - 3.0 standard drinks of alcohol    Types: 2 - 3 Standard drinks or equivalent per week     Comment: occassionally   Drug use: No    Family History  Problem Relation Age of Onset   Hypertension Father    Hypertension Brother     Allergies  Allergen Reactions   Prednisone Rash and Other (See Comments)    Pt states "It makes me feel weird.    Medication list has been reviewed and updated.  Current Outpatient Medications on File Prior to Visit  Medication Sig Dispense Refill   benzonatate (TESSALON) 100 MG capsule Take 1-2 capsules (100-200 mg total) by mouth 3 (three) times daily as needed. 30 capsule 0   buPROPion ER (WELLBUTRIN SR) 100 MG 12 hr tablet TAKE 1 TABLET BY MOUTH DAILY FOR 7 DAYS THEN TAKE 1 TABLET BY MOUTH TWICE A DAY THEREAFTER (TAKE SECOND DOSE BEFORE 4PM) 60 tablet 0   Cholecalciferol (VITAMIN D3) 1.25 MG (50000 UT) CAPS Take 1 weekly for 12 weeks 12 capsule 0   methocarbamol (ROBAXIN) 500 MG tablet Take 1 tablet (500 mg total) by mouth 4 (four) times daily. Use as needed for muscle spasm 30 tablet 1   nitrofurantoin, macrocrystal-monohydrate, (MACROBID) 100 MG capsule Take 1 capsule (100 mg total) by mouth 2 (two) times daily. 14 capsule 0   valACYclovir (VALTREX) 1000 MG tablet Take 1 tablet (1,000 mg total) by mouth 3 (three) times daily. 21 tablet 0  venlafaxine XR (EFFEXOR XR) 75 MG 24 hr capsule Take 1 capsule (75 mg total) by mouth daily with breakfast. 30 capsule 3   No current facility-administered medications on file prior to visit.    Review of Systems:  As per HPI- otherwise negative.   Physical Examination: There were no vitals filed for this visit. There were no vitals filed for this visit. There is no height or weight on file to calculate BMI. Ideal Body Weight:    GEN: no acute distress. HEENT: Atraumatic, Normocephalic.  Ears and Nose: No external deformity. CV: RRR, No M/G/R. No JVD. No thrill. No extra heart sounds. PULM: CTA B, no wheezes, crackles, rhonchi. No retractions. No resp. distress. No accessory muscle  use. ABD: S, NT, ND, +BS. No rebound. No HSM. EXTR: No c/c/e PSYCH: Normally interactive. Conversant.    Assessment and Plan: ***  Signed Abbe Amsterdam, MD

## 2022-11-01 NOTE — Patient Instructions (Incomplete)
Good to see you again today - I will be in touch with your labs asap Start on the protonix daily for GERD- ok to use Tums as well Let me know how this seems to work for you Use the protonix daily for one month, can extend another month if desired

## 2022-11-02 ENCOUNTER — Ambulatory Visit: Payer: BC Managed Care – PPO | Admitting: Family Medicine

## 2022-11-02 VITALS — BP 120/80 | HR 82 | Temp 98.3°F | Resp 18 | Ht 67.0 in | Wt 258.6 lb

## 2022-11-02 DIAGNOSIS — K219 Gastro-esophageal reflux disease without esophagitis: Secondary | ICD-10-CM

## 2022-11-02 DIAGNOSIS — Z3201 Encounter for pregnancy test, result positive: Secondary | ICD-10-CM

## 2022-11-02 DIAGNOSIS — N926 Irregular menstruation, unspecified: Secondary | ICD-10-CM | POA: Diagnosis not present

## 2022-11-02 LAB — HCG, QUANTITATIVE, PREGNANCY: Quantitative HCG: 265.19 m[IU]/mL

## 2022-11-02 LAB — POCT URINE PREGNANCY: Preg Test, Ur: POSITIVE — AB

## 2022-11-02 MED ORDER — PANTOPRAZOLE SODIUM 40 MG PO TBEC: 40.0000 mg | DELAYED_RELEASE_TABLET | Freq: Every day | ORAL | 1 refills | Status: DC

## 2022-11-04 DIAGNOSIS — Z3201 Encounter for pregnancy test, result positive: Secondary | ICD-10-CM | POA: Diagnosis not present

## 2022-11-07 DIAGNOSIS — N939 Abnormal uterine and vaginal bleeding, unspecified: Secondary | ICD-10-CM | POA: Diagnosis not present

## 2022-11-15 DIAGNOSIS — O2690 Pregnancy related conditions, unspecified, unspecified trimester: Secondary | ICD-10-CM | POA: Diagnosis not present

## 2022-11-15 DIAGNOSIS — O209 Hemorrhage in early pregnancy, unspecified: Secondary | ICD-10-CM | POA: Diagnosis not present

## 2023-01-17 ENCOUNTER — Encounter: Payer: Self-pay | Admitting: Family Medicine

## 2023-01-23 ENCOUNTER — Ambulatory Visit: Payer: BC Managed Care – PPO | Admitting: Family Medicine

## 2023-03-06 DIAGNOSIS — A499 Bacterial infection, unspecified: Secondary | ICD-10-CM | POA: Diagnosis not present

## 2023-03-07 ENCOUNTER — Ambulatory Visit: Payer: BC Managed Care – PPO | Admitting: Family Medicine

## 2023-03-07 ENCOUNTER — Encounter: Payer: Self-pay | Admitting: Family Medicine

## 2023-03-07 VITALS — BP 122/84 | HR 94 | Temp 98.6°F | Ht 67.0 in | Wt 249.2 lb

## 2023-03-07 DIAGNOSIS — R519 Headache, unspecified: Secondary | ICD-10-CM | POA: Diagnosis not present

## 2023-03-07 MED ORDER — KETOROLAC TROMETHAMINE 60 MG/2ML IM SOLN
60.0000 mg | Freq: Once | INTRAMUSCULAR | Status: AC
Start: 2023-03-07 — End: 2023-03-07
  Administered 2023-03-07: 60 mg via INTRAMUSCULAR

## 2023-03-07 NOTE — Patient Instructions (Signed)
No ibuprofen/NSAIDs for the rest of the day.   OK to take Tylenol 1000 mg (2 extra strength tabs) or 975 mg (3 regular strength tabs) every 6 hours as needed.  Ice/cold pack over area for 10-15 min twice daily.  Heat (pad or rice pillow in microwave) over affected area, 10-15 minutes twice daily.   Let us know if you need anything.

## 2023-03-07 NOTE — Progress Notes (Signed)
Chief Complaint  Patient presents with   Headache    Behind her left eye Facial pain      Katherine [name] is a 32 y.o. female here for evaluation of an acute headache.  Duration: 2 weeks Laterality: left Quality: aching Severity:  4-10 /10 Associated symptoms: pressure in eye, pain over sinuses, light sensitivity Denies: Nausea, vomiting, vision changes, difficulty swallowing, trouble with speech, balance issues, weakness, fevers, upper respiratory symptoms, paresthesias, dental pain, jaw pain Therapies tried: Excedrin, ibuprofen, Tylenol INCS, warm/cold compresses Hx of migraines: no. Trauma: no  Past Medical History:  Diagnosis Date   Anxiety    Asthma    Depression    Elevated blood pressure reading    Frequent headaches    Recurrent UTI     BP 122/84 (BP Location: Left Arm, Patient Position: Sitting, Cuff Size: Large)   Pulse 94   Temp 98.6 F (37 C) (Oral)   Ht 5\' 7"  (1.702 m)   Wt 249 lb 4 oz (113.1 kg)   SpO2 97%   BMI 39.04 kg/m  Gen: awake, alert, appearing stated age Eyes: PERRLA, EOMi, no injection; mild ttp over L globe Heart: RRR Lungs: no accessory muscle use Neuro: 5/5 strength throughout, CN2-12 grossly intact, fluent and goal-oriented speech, DTR's equal and symmetric in UE's and LE's MSK: TTP over frontal and maxillary sinus, temporalis; no TTP over cervical paraspinal musculature or occipital triangle region Psych: Age appropriate judgment and insight, normal affect and mood  Acute nonintractable headache, unspecified headache type - Plan: ketorolac (TORADOL) injection 60 mg  Toradol injection today.  Tylenol, ice, heat.  Schedule an appointment with her eye doctor later in the week if not improving so they could hopefully measure her eye pressure.  This is not acute glaucoma.  Sinusitis possible. F/u prn. The pt voiced understanding and agreement to the plan.  Jilda Roche [place], DO 03/07/23 4:17 PM

## 2023-03-09 ENCOUNTER — Encounter: Payer: Self-pay | Admitting: Family Medicine

## 2023-03-10 ENCOUNTER — Other Ambulatory Visit: Payer: Self-pay | Admitting: Family Medicine

## 2023-03-10 MED ORDER — RIZATRIPTAN BENZOATE 10 MG PO TABS: 10.0000 mg | ORAL_TABLET | ORAL | 0 refills | Status: DC | PRN

## 2023-03-12 ENCOUNTER — Encounter: Payer: Self-pay | Admitting: Family Medicine

## 2023-03-15 ENCOUNTER — Encounter: Payer: Self-pay | Admitting: Family Medicine

## 2023-03-15 ENCOUNTER — Ambulatory Visit: Payer: BC Managed Care – PPO | Admitting: Family Medicine

## 2023-03-15 VITALS — BP 124/82 | HR 47 | Temp 98.5°F | Ht 67.0 in | Wt 246.0 lb

## 2023-03-15 DIAGNOSIS — M542 Cervicalgia: Secondary | ICD-10-CM

## 2023-03-15 DIAGNOSIS — G44201 Tension-type headache, unspecified, intractable: Secondary | ICD-10-CM

## 2023-03-15 DIAGNOSIS — R519 Headache, unspecified: Secondary | ICD-10-CM

## 2023-03-15 MED ORDER — SUMATRIPTAN SUCCINATE 100 MG PO TABS
100.0000 mg | ORAL_TABLET | ORAL | 0 refills | Status: DC | PRN
Start: 2023-03-15 — End: 2023-10-11

## 2023-03-15 MED ORDER — TIZANIDINE HCL 4 MG PO TABS
4.0000 mg | ORAL_TABLET | Freq: Four times a day (QID) | ORAL | 0 refills | Status: DC | PRN
Start: 2023-03-15 — End: 2023-10-11

## 2023-03-15 NOTE — Patient Instructions (Signed)
Someone will reach out in the next few business days to schedule your appt.   Heat (pad or rice pillow in microwave) over affected area, 10-15 minutes twice daily.   Ice/cold pack over area for 10-15 min twice daily.  OK to take Tylenol 1000 mg (2 extra strength tabs) or 975 mg (3 regular strength tabs) every 6 hours as needed.  Take the muscle relaxer 1-2 hours before planned bedtime. If it makes you drowsy, do not take during the day. You can try half a tab the following night.  Let us know if you need anything.  EXERCISES RANGE OF MOTION (ROM) AND STRETCHING EXERCISES  These exercises may help you when beginning to rehabilitate your issue. In order to successfully resolve your symptoms, you must improve your posture. These exercises are designed to help reduce the forward-head and rounded-shoulder posture which contributes to this condition. Your symptoms may resolve with or without further involvement from your physician, physical therapist or athletic trainer. While completing these exercises, remember:  Restoring tissue flexibility helps normal motion to return to the joints. This allows healthier, less painful movement and activity. An effective stretch should be held for at least 20 seconds, although you may need to begin with shorter hold times for comfort. A stretch should never be painful. You should only feel a gentle lengthening or release in the stretched tissue. Do not do any stretch or exercise that you cannot tolerate.  STRETCH- Axial Extensors Lie on your back on the floor. You may bend your knees for comfort. Place a rolled-up hand towel or dish towel, about 2 inches in diameter, under the part of your head that makes contact with the floor. Gently tuck your chin, as if trying to make a "double chin," until you feel a gentle stretch at the base of your head. Hold 15-20 seconds. Repeat 2-3 times. Complete this exercise 1 time per day.   STRETCH - Axial Extension  Stand or  sit on a firm surface. Assume a good posture: chest up, shoulders drawn back, abdominal muscles slightly tense, knees unlocked (if standing) and feet hip width apart. Slowly retract your chin so your head slides back and your chin slightly lowers. Continue to look straight ahead. You should feel a gentle stretch in the back of your head. Be certain not to feel an aggressive stretch since this can cause headaches later. Hold for 15-20 seconds. Repeat 2-3 times. Complete this exercise 1 time per day.  STRETCH - Cervical Side Bend  Stand or sit on a firm surface. Assume a good posture: chest up, shoulders drawn back, abdominal muscles slightly tense, knees unlocked (if standing) and feet hip width apart. Without letting your nose or shoulders move, slowly tip your right / left ear to your shoulder until your feel a gentle stretch in the muscles on the opposite side of your neck. Hold 15-20 seconds. Repeat 2-3 times. Complete this exercise 1-2 times per day.  STRETCH - Cervical Rotators  Stand or sit on a firm surface. Assume a good posture: chest up, shoulders drawn back, abdominal muscles slightly tense, knees unlocked (if standing) and feet hip width apart. Keeping your eyes level with the ground, slowly turn your head until you feel a gentle stretch along the back and opposite side of your neck. Hold 15-20 seconds. Repeat 2-3 times. Complete this exercise 1-2 times per day.  RANGE OF MOTION - Neck Circles  Stand or sit on a firm surface. Assume a good posture: chest up,  shoulders drawn back, abdominal muscles slightly tense, knees unlocked (if standing) and feet hip width apart. Gently roll your head down and around from the back of one shoulder to the back of the other. The motion should never be forced or painful. Repeat the motion 10-20 times, or until you feel the neck muscles relax and loosen. Repeat 2-3 times. Complete the exercise 1-2 times per day. STRENGTHENING EXERCISES - Cervical  Strain and Sprain These exercises may help you when beginning to rehabilitate your injury. They may resolve your symptoms with or without further involvement from your physician, physical therapist, or athletic trainer. While completing these exercises, remember:  Muscles can gain both the endurance and the strength needed for everyday activities through controlled exercises. Complete these exercises as instructed by your physician, physical therapist, or athletic trainer. Progress the resistance and repetitions only as guided. You may experience muscle soreness or fatigue, but the pain or discomfort you are trying to eliminate should never worsen during these exercises. If this pain does worsen, stop and make certain you are following the directions exactly. If the pain is still present after adjustments, discontinue the exercise until you can discuss the trouble with your clinician.  STRENGTH - Cervical Flexors, Isometric Face a wall, standing about 6 inches away. Place a small pillow, a ball about 6-8 inches in diameter, or a folded towel between your forehead and the wall. Slightly tuck your chin and gently push your forehead into the soft object. Push only with mild to moderate intensity, building up tension gradually. Keep your jaw and forehead relaxed. Hold 10 to 20 seconds. Keep your breathing relaxed. Release the tension slowly. Relax your neck muscles completely before you start the next repetition. Repeat 2-3 times. Complete this exercise 1 time per day.  STRENGTH- Cervical Lateral Flexors, Isometric  Stand about 6 inches away from a wall. Place a small pillow, a ball about 6-8 inches in diameter, or a folded towel between the side of your head and the wall. Slightly tuck your chin and gently tilt your head into the soft object. Push only with mild to moderate intensity, building up tension gradually. Keep your jaw and forehead relaxed. Hold 10 to 20 seconds. Keep your breathing  relaxed. Release the tension slowly. Relax your neck muscles completely before you start the next repetition. Repeat 2-3 times. Complete this exercise 1 time per day.  STRENGTH - Cervical Extensors, Isometric  Stand about 6 inches away from a wall. Place a small pillow, a ball about 6-8 inches in diameter, or a folded towel between the back of your head and the wall. Slightly tuck your chin and gently tilt your head back into the soft object. Push only with mild to moderate intensity, building up tension gradually. Keep your jaw and forehead relaxed. Hold 10 to 20 seconds. Keep your breathing relaxed. Release the tension slowly. Relax your neck muscles completely before you start the next repetition. Repeat 2-3 times. Complete this exercise 1 time per day.  POSTURE AND BODY MECHANICS CONSIDERATIONS Keeping correct posture when sitting, standing or completing your activities will reduce the stress put on different body tissues, allowing injured tissues a chance to heal and limiting painful experiences. The following are general guidelines for improved posture. Your physician or physical therapist will provide you with any instructions specific to your needs. While reading these guidelines, remember: The exercises prescribed by your provider will help you have the flexibility and strength to maintain correct postures. The correct posture provides the  optimal environment for your joints to work. All of your joints have less wear and tear when properly supported by a spine with good posture. This means you will experience a healthier, less painful body. Correct posture must be practiced with all of your activities, especially prolonged sitting and standing. Correct posture is as important when doing repetitive low-stress activities (typing) as it is when doing a single heavy-load activity (lifting).  PROLONGED STANDING WHILE SLIGHTLY LEANING FORWARD When completing a task that requires you to lean  forward while standing in one place for a long time, place either foot up on a stationary 2- to 4-inch high object to help maintain the best posture. When both feet are on the ground, the low back tends to lose its slight inward curve. If this curve flattens (or becomes too large), then the back and your other joints will experience too much stress, fatigue more quickly, and can cause pain.   RESTING POSITIONS Consider which positions are most painful for you when choosing a resting position. If you have pain with flexion-based activities (sitting, bending, stooping, squatting), choose a position that allows you to rest in a less flexed posture. You would want to avoid curling into a fetal position on your side. If your pain worsens with extension-based activities (prolonged standing, working overhead), avoid resting in an extended position such as sleeping on your stomach. Most people will find more comfort when they rest with their spine in a more neutral position, neither too rounded nor too arched. Lying on a non-sagging bed on your side with a pillow between your knees, or on your back with a pillow under your knees will often provide some relief. Keep in mind, being in any one position for a prolonged period of time, no matter how correct your posture, can still lead to stiffness.  WALKING Walk with an upright posture. Your ears, shoulders, and hips should all line up. OFFICE WORK When working at a desk, create an environment that supports good, upright posture. Without extra support, muscles fatigue and lead to excessive strain on joints and other tissues.  CHAIR: A chair should be able to slide under your desk when your back makes contact with the back of the chair. This allows you to work closely. The chair's height should allow your eyes to be level with the upper part of your monitor and your hands to be slightly lower than your elbows. Body position: Your feet should make contact with the  floor. If this is not possible, use a foot rest. Keep your ears over your shoulders. This will reduce stress on your neck and low back.

## 2023-03-15 NOTE — Progress Notes (Signed)
No chief complaint on file.   Katherine [name] is a 32 y.o. female here for evaluation of left headache.  Treatment: Maxalt, Toradol, magnesium Aura: None Palliation: Toradol helped for the day Provocation: None Associated symptoms: Eye pressure, neck pain Denies: Gum chewing, jaw pain, injury, nausea, vomiting, photopsia, diplopia Currently with headache? Yes Stress levels are not particularly high. She went to her eye doctor last week and was told there was nothing wrong from their standpoint.  BP 124/82 (BP Location: Left Arm, Patient Position: Sitting, Cuff Size: Large)   Pulse (!) 47   Temp 98.5 F (36.9 C) (Oral)   Ht 5\' 7"  (1.702 m)   Wt 246 lb (111.6 kg)   SpO2 99%   BMI 38.53 kg/m  General: awake, alert, appearing stated age Eyes: PERRLA, EOMi Heart: RRR Lungs: CTAB, no accessory muscle use Neuro: 5/5 strength throughout, normal gait, CN 2-12 intact, no cerebellar signs, DTR's equal and symmetry, no clonus MSK:  no TTP over posterior cervical triangle or paraspinal cervical musculature, + TTP over the left lateral musculature of the neck Psych: Age appropriate judgment and insight, mood and affect normal  Acute intractable headache, unspecified headache type - Plan: SUMAtriptan (IMITREX) 100 MG tablet  Acute intractable tension-type headache - Plan: MR Brain Wo Contrast  Neck pain - Plan: tiZANidine (ZANAFLEX) 4 MG tablet  Potentially chronic problem that is not controlled.  Check an MRI of the brain.  May have to settle for a CT instead.  Would prefer to avoid radiation.  Stop Maxalt.  Start Imitrex as needed.  Continue magnesium 500 mg daily.  I did give her stretches and exercises for her neck in addition to a muscle relaxer to use as needed.  This could be causing the pain as well.  It is unlikely she has an infection at this point.  The injection did help.  Her neuro exam is not suggestive of anything sinister.  Follow-up pending the above.  She was told to seek  immediate care if her symptoms worsen. The patient voiced understanding and agreement to the plan.  Jilda Roche [place], [place] 4:50 PM 03/15/23

## 2023-03-20 ENCOUNTER — Ambulatory Visit (INDEPENDENT_AMBULATORY_CARE_PROVIDER_SITE_OTHER): Payer: BC Managed Care – PPO

## 2023-03-20 DIAGNOSIS — R519 Headache, unspecified: Secondary | ICD-10-CM | POA: Diagnosis not present

## 2023-03-20 DIAGNOSIS — J329 Chronic sinusitis, unspecified: Secondary | ICD-10-CM | POA: Diagnosis not present

## 2023-03-20 DIAGNOSIS — G44201 Tension-type headache, unspecified, intractable: Secondary | ICD-10-CM | POA: Diagnosis not present

## 2023-03-21 ENCOUNTER — Ambulatory Visit (INDEPENDENT_AMBULATORY_CARE_PROVIDER_SITE_OTHER): Payer: BC Managed Care – PPO | Admitting: Nurse Practitioner

## 2023-03-21 ENCOUNTER — Ambulatory Visit: Payer: BC Managed Care – PPO | Admitting: Family

## 2023-03-21 ENCOUNTER — Encounter: Payer: Self-pay | Admitting: Nurse Practitioner

## 2023-03-21 VITALS — BP 127/84 | HR 70 | Wt 242.2 lb

## 2023-03-21 DIAGNOSIS — J029 Acute pharyngitis, unspecified: Secondary | ICD-10-CM

## 2023-03-21 LAB — POC COVID19 BINAXNOW: SARS Coronavirus 2 Ag: NEGATIVE

## 2023-03-21 LAB — POCT RAPID STREP A (OFFICE): Rapid Strep A Screen: NEGATIVE

## 2023-03-21 MED ORDER — PENICILLIN V POTASSIUM 500 MG PO TABS
500.0000 mg | ORAL_TABLET | Freq: Three times a day (TID) | ORAL | 0 refills | Status: AC
Start: 2023-03-21 — End: 2023-03-31

## 2023-03-21 NOTE — Progress Notes (Signed)
Acute Office Visit  Subjective:    Patient ID: Katherine [name], female    DOB: Sep 07, 1990, 32 y.o.   MRN: [id number]  Chief Complaint  Patient presents with   Sore Throat    Started Sunday; fever started Sunday afternoon. Symptoms are sore throat, fever, and chills. Denies any congestion, headache. Tested for Covid last night was negative.    Sore Throat  This is a new problem. The current episode started in the past 7 days. The problem has been unchanged. The maximum temperature recorded prior to her arrival was 100.4 - 100.9 F. The fever has been present for 1 to 2 days. The pain is moderate. Associated symptoms include headaches, a hoarse voice and swollen glands. Pertinent negatives include no abdominal pain, congestion, coughing, diarrhea, drooling, ear discharge, ear pain, plugged ear sensation, neck pain, shortness of breath, stridor, trouble swallowing or vomiting. She has had no exposure to strep or mono. She has tried nothing for the symptoms.   Outpatient Medications Prior to Visit  Medication Sig   SEMAGLUTIDE PO Take by mouth. 5 units   SUMAtriptan (IMITREX) 100 MG tablet Take 1 tablet (100 mg total) by mouth every 2 (two) hours as needed for migraine. May repeat in 2 hours if headache persists or recurs.   tiZANidine (ZANAFLEX) 4 MG tablet Take 1 tablet (4 mg total) by mouth every 6 (six) hours as needed for muscle spasms.   No facility-administered medications prior to visit.   Reviewed past medical and social history.  Review of Systems  HENT:  Positive for hoarse voice. Negative for congestion, drooling, ear discharge, ear pain and trouble swallowing.   Respiratory:  Negative for cough, shortness of breath and stridor.   Gastrointestinal:  Negative for abdominal pain, diarrhea and vomiting.  Musculoskeletal:  Negative for neck pain.  Neurological:  Positive for headaches.   Per HPI     Objective:    Physical Exam Constitutional:      General: She is not in  acute distress. HENT:     Right Ear: Tympanic membrane, ear canal and external ear normal.     Left Ear: Tympanic membrane, ear canal and external ear normal.     Nose: No nasal tenderness, mucosal edema, congestion or rhinorrhea.     Right Nostril: No occlusion.     Left Nostril: No occlusion.     Right Turbinates: Not enlarged, swollen or pale.     Left Turbinates: Not enlarged, swollen or pale.     Right Sinus: No maxillary sinus tenderness or frontal sinus tenderness.     Left Sinus: No maxillary sinus tenderness or frontal sinus tenderness.     Mouth/Throat:     Mouth: No oral lesions.     Pharynx: Uvula midline. Oropharyngeal exudate and posterior oropharyngeal erythema present. No pharyngeal swelling or uvula swelling.     Tonsils: No tonsillar exudate or tonsillar abscesses. 1+ on the right. 1+ on the left.  Eyes:     Extraocular Movements: Extraocular movements intact.     Conjunctiva/sclera: Conjunctivae normal.  Cardiovascular:     Rate and Rhythm: Normal rate and regular rhythm.     Pulses: Normal pulses.     Heart sounds: Normal heart sounds.  Pulmonary:     Effort: Pulmonary effort is normal.     Breath sounds: Normal breath sounds.  Musculoskeletal:     Cervical back: Normal range of motion and neck supple.  Lymphadenopathy:     Cervical: Cervical adenopathy present.  Skin:    Findings: No rash.  Neurological:     Mental Status: She is alert and oriented to person, place, and time.    BP 127/84   Pulse 70   Wt 242 lb 3.2 oz (109.9 kg)   SpO2 98%   BMI 37.93 kg/m    Results for orders placed or performed in visit on 03/21/23  POCT rapid strep A  Result Value Ref Range   Rapid Strep A Screen Negative Negative  POC COVID-19  Result Value Ref Range   SARS Coronavirus 2 Ag Negative Negative      Assessment & Plan:   Problem List Items Addressed This Visit   None Visit Diagnoses     Acute pharyngitis, unspecified etiology    -  Primary   Relevant  Medications   penicillin v potassium (VEETID) 500 MG tablet   Other Relevant Orders   POCT rapid strep A (Completed)   POC COVID-19 (Completed)      Meds ordered this encounter  Medications   penicillin v potassium (VEETID) 500 MG tablet    Sig: Take 1 tablet (500 mg total) by mouth 3 (three) times daily for 10 days.    Dispense:  30 tablet    Refill:  0    Order Specific Question:   Supervising Provider    Answer:   Mliss Sax [[id number]]  Treated with oral abx based on symptoms despite negative POC test.  Return if symptoms worsen or fail to improve.    Alysia Penna, NP

## 2023-03-21 NOTE — Patient Instructions (Signed)
Maintain adequate oral hydration Use tylenol or ibuprofen prn for pain and fever

## 2023-03-29 DIAGNOSIS — E236 Other disorders of pituitary gland: Secondary | ICD-10-CM | POA: Diagnosis not present

## 2023-03-29 DIAGNOSIS — N926 Irregular menstruation, unspecified: Secondary | ICD-10-CM | POA: Diagnosis not present

## 2023-04-02 NOTE — Progress Notes (Deleted)
[facility] [address] [place], [place] [address] [phone]  Date:  04/10/2023   Name:  Katherine [name]   DOB:  1991-04-28   MRN:  [id number]  PCP:  Pearline Cables, MD    Chief Complaint: No chief complaint on file.   History of Present Illness:  Katherine [name] is a 32 y.o. very pleasant female patient who presents with the following:  Patient seen today to discuss some recent results Most recent visit with myself was in May at which time she was dealing with some acid reflux and ended up testing positive for pregnancy, but she was having some bleeding.  I am not certain if she is still pregnant now  History of preeclampsia during pregnancy with preterm delivery at 28 weeks in August 2022, family history of hypertension   Patient Active Problem List   Diagnosis Date Noted   Strep throat 12/29/2021   Sore throat 04/22/2021   Nasal congestion 04/22/2021   Sinus pressure 04/22/2021   Preeclampsia, severe 03/01/2021   IUGR (intrauterine growth restriction) affecting care of mother 02/24/2021   Transient hypertension of pregnancy 02/18/2021   Overweight 01/14/2016    Past Medical History:  Diagnosis Date   Anxiety    Asthma    Depression    Elevated blood pressure reading    Frequent headaches    Recurrent UTI     Past Surgical History:  Procedure Laterality Date   CESAREAN SECTION N/A 03/01/2021   Procedure: CESAREAN SECTION;  Surgeon: Lyn Henri, MD;  Location: [facility] LD ORS;  Service: Obstetrics;  Laterality: N/A;   DILATION AND EVACUATION N/A 02/11/2020   Procedure: DILATATION AND EVACUATION;  Surgeon: Mitchel Honour, DO;  Location: [facility] OR;  Service: Gynecology;  Laterality: N/A;    Social History   Tobacco Use   Smoking status: Never   Smokeless tobacco: Never  Vaping Use   Vaping status: Never Used  Substance Use Topics   Alcohol use: Yes    Alcohol/week: 2.0 - 3.0 standard  drinks of alcohol    Types: 2 - 3 Standard drinks or equivalent per week    Comment: occassionally   Drug use: No    Family History  Problem Relation Age of Onset   Hypertension Father    Hypertension Brother     Allergies  Allergen Reactions   Prednisone Rash and Other (See Comments)    Pt states "It makes me feel weird.    Medication list has been reviewed and updated.  Current Outpatient Medications on File Prior to Visit  Medication Sig Dispense Refill   SEMAGLUTIDE PO Take by mouth. 5 units     SUMAtriptan (IMITREX) 100 MG tablet Take 1 tablet (100 mg total) by mouth every 2 (two) hours as needed for migraine. May repeat in 2 hours if headache persists or recurs. 10 tablet 0   tiZANidine (ZANAFLEX) 4 MG tablet Take 1 tablet (4 mg total) by mouth every 6 (six) hours as needed for muscle spasms. 30 tablet 0   No current facility-administered medications on file prior to visit.    Review of Systems:  As per HPI- otherwise negative.   Physical Examination: There were no vitals filed for this visit. There were no vitals filed for this visit. There is no height or weight on file to calculate BMI. Ideal Body Weight:    GEN: no acute distress. HEENT: Atraumatic, Normocephalic.  Ears  and Nose: No external deformity. CV: RRR, No M/G/R. No JVD. No thrill. No extra heart sounds. PULM: CTA B, no wheezes, crackles, rhonchi. No retractions. No resp. distress. No accessory muscle use. ABD: S, NT, ND, +BS. No rebound. No HSM. EXTR: No c/c/e PSYCH: Normally interactive. Conversant.    Assessment and Plan: ***  Signed Abbe Amsterdam, MD

## 2023-04-10 ENCOUNTER — Ambulatory Visit: Payer: BC Managed Care – PPO | Admitting: Family Medicine

## 2023-04-13 NOTE — Progress Notes (Signed)
[facility] [address] [place], [place] [address] [phone]  Date:  04/17/2023   Name:  Katherine [name]   DOB:  February 17, 1991   MRN:  [id number]  PCP:  Pearline Cables, MD    Chief Complaint: Follow-up (Pt would like to go over MRI results and blood work from [facility] )   History of Present Illness:  Katherine [name] is a 32 y.o. very pleasant female patient who presents with the following:  Patient seen today for follow-up Most recent visit with myself was in May at which time we discovered an unexpected pregnancy but she was having some bleeding - she ended up having a MC.  Shared sympathy, patient notes she is doing okay in this regard  She has history of preeclampsia and preterm delivery, she delivered her daughter at 13 weeks in August 2022  She was seen by Dr Carmelia Roller about a month ago with headache- they did an MRI on 9/16 as follows: IMPRESSION: 1. Partially empty sella turcica. This finding can reflect incidental anatomic variation, or alternatively, it can be associated with idiopathic intracranial hypertension (pseudotumor cerebri). 2. Otherwise unremarkable non-contrast MRI appearance of the brain. 3. Paranasal sinus disease as described.  In any case her HA did go away, she is no longer having to use any medication for this-she tried imitrex and tizanidine.  The headaches went away as suddenly as they come on - no HA in a month She did see her eye doctor and no papilledema per her report  She did see her GYN and had several labs done which we looked at today.  Overall everything appears normal Her prolactin level was normal  [name] is concerned because she still does not feel quite right, she notes she tends to feel fairly tired.  Her husband does note that she snores, she notes sleep apnea runs in her family  Patient Active Problem List   Diagnosis Date Noted   Preeclampsia, severe 03/01/2021    IUGR (intrauterine growth restriction) affecting care of mother 02/24/2021   Transient hypertension of pregnancy 02/18/2021   Overweight 01/14/2016    Past Medical History:  Diagnosis Date   Anxiety    Asthma    Depression    Elevated blood pressure reading    Frequent headaches    Recurrent UTI     Past Surgical History:  Procedure Laterality Date   CESAREAN SECTION N/A 03/01/2021   Procedure: CESAREAN SECTION;  Surgeon: Lyn Henri, MD;  Location: [facility];  Service: Obstetrics;  Laterality: N/A;   DILATION AND EVACUATION N/A 02/11/2020   Procedure: DILATATION AND EVACUATION;  Surgeon: Mitchel Honour, DO;  Location: [facility] OR;  Service: Gynecology;  Laterality: N/A;    Social History   Tobacco Use   Smoking status: Never   Smokeless tobacco: Never  Vaping Use   Vaping status: Never Used  Substance Use Topics   Alcohol use: Yes    Alcohol/week: 2.0 - 3.0 standard drinks of alcohol    Types: 2 - 3 Standard drinks or equivalent per week    Comment: occassionally   Drug use: No    Family History  Problem Relation Age of Onset   Hypertension Father    Hypertension Brother     Allergies  Allergen Reactions   Prednisone Rash and Other (See Comments)    Pt states "It makes me feel weird.    Medication list has  been reviewed and updated.  Current Outpatient Medications on File Prior to Visit  Medication Sig Dispense Refill   SEMAGLUTIDE PO Take by mouth. 5 units     SUMAtriptan (IMITREX) 100 MG tablet Take 1 tablet (100 mg total) by mouth every 2 (two) hours as needed for migraine. May repeat in 2 hours if headache persists or recurs. (Patient not taking: Reported on 04/17/2023) 10 tablet 0   tiZANidine (ZANAFLEX) 4 MG tablet Take 1 tablet (4 mg total) by mouth every 6 (six) hours as needed for muscle spasms. (Patient not taking: Reported on 04/17/2023) 30 tablet 0   No current facility-administered medications on file prior to visit.    Review of  Systems:  As per HPI- otherwise negative.   Physical Examination: Vitals:   04/17/23 1044 04/17/23 1135  BP: 116/70   Pulse: (!) 105 70  Resp: 18   SpO2: 98%    Vitals:   04/17/23 1044  Weight: 240 lb (108.9 kg)  Height: 5\' 7"  (1.702 m)   Body mass index is 37.59 kg/m. Ideal Body Weight: Weight in (lb) to have BMI = 25: 159.3  GEN: no acute distress.  Obese, looks well  HEENT: Atraumatic, Normocephalic.  Bilateral TM wnl, oropharynx normal.  PEERL,EOMI. she has a small posterior oropharynx which may predispose to sleep apnea Ears and Nose: No external deformity. CV: RRR, No M/G/R. No JVD. No thrill. No extra heart sounds. PULM: CTA B, no wheezes, crackles, rhonchi. No retractions. No resp. distress. No accessory muscle use. ABD: S, NT, ND, +BS. No rebound. No HSM. EXTR: No c/c/e PSYCH: Normally interactive. Conversant.    Assessment and Plan: Vitamin D deficiency - Plan: VITAMIN D 25 Hydroxy (Vit-D Deficiency, Fractures)  Screening for deficiency anemia - Plan: CBC  Transaminitis - Plan: Comprehensive metabolic panel  Chronic fatigue - Plan: Cortisol, Home sleep test  Patient seen today for follow-up.  As above, she has noted fatigue for some time, just want to be sure everything was okay.  Her gynecologist has already done blood work.  We discussed her MRI and idiopathic intracranial hypertension.  She saw her eye doctor who ruled out papilledema and her headaches have resolved.  At this time, we plan to reassess should her headaches return I will check her vitamin D which has been low, she would like to check her cortisol level which is fine.  We will set her up for a sleep apnea test at home as well I have asked her let me know if anything is changing or getting worse while her workup is pending  Signed Abbe Amsterdam, MD

## 2023-04-17 ENCOUNTER — Encounter: Payer: Self-pay | Admitting: Family Medicine

## 2023-04-17 ENCOUNTER — Ambulatory Visit: Payer: BC Managed Care – PPO | Admitting: Family Medicine

## 2023-04-17 VITALS — BP 116/70 | HR 70 | Resp 18 | Ht 67.0 in | Wt 240.0 lb

## 2023-04-17 DIAGNOSIS — R5382 Chronic fatigue, unspecified: Secondary | ICD-10-CM

## 2023-04-17 DIAGNOSIS — R7401 Elevation of levels of liver transaminase levels: Secondary | ICD-10-CM

## 2023-04-17 DIAGNOSIS — E559 Vitamin D deficiency, unspecified: Secondary | ICD-10-CM

## 2023-04-17 DIAGNOSIS — Z130 Encounter for screening for diseases of the blood and blood-forming organs and certain disorders involving the immune mechanism: Secondary | ICD-10-CM | POA: Diagnosis not present

## 2023-04-17 LAB — COMPREHENSIVE METABOLIC PANEL
ALT: 12 U/L (ref 0–35)
AST: 12 U/L (ref 0–37)
Albumin: 4.3 g/dL (ref 3.5–5.2)
Alkaline Phosphatase: 49 U/L (ref 39–117)
BUN: 11 mg/dL (ref 6–23)
CO2: 28 meq/L (ref 19–32)
Calcium: 10.1 mg/dL (ref 8.4–10.5)
Chloride: 105 meq/L (ref 96–112)
Creatinine, Ser: 0.72 mg/dL (ref 0.40–1.20)
GFR: 110.17 mL/min (ref >60.00)
Glucose, Bld: 87 mg/dL (ref 70–99)
Potassium: 4.7 meq/L (ref 3.5–5.1)
Sodium: 139 meq/L (ref 135–145)
Total Bilirubin: 0.4 mg/dL (ref 0.2–1.2)
Total Protein: 6.9 g/dL (ref 6.0–8.3)

## 2023-04-17 LAB — VITAMIN D 25 HYDROXY (VIT D DEFICIENCY, FRACTURES): VITD: 22.24 ng/mL — ABNORMAL LOW (ref 30.00–100.00)

## 2023-04-17 LAB — CBC
HCT: 40.9 % (ref 36.0–46.0)
Hemoglobin: 13.4 g/dL (ref 12.0–15.0)
MCHC: 32.8 g/dL (ref 30.0–36.0)
MCV: 88.8 fL (ref 78.0–100.0)
Platelets: 246 10*3/uL (ref 150.0–400.0)
RBC: 4.6 Mil/uL (ref 3.87–5.11)
RDW: 14.2 % (ref 11.5–15.5)
WBC: 6.8 10*3/uL (ref 4.0–10.5)

## 2023-04-17 LAB — CORTISOL: Cortisol, Plasma: 9.2 ug/dL

## 2023-04-17 NOTE — Patient Instructions (Addendum)
Good to see you today- be sure to get your flu shot this fall!   I will be in touch with your labs I would like to use [facility] for you assuming they take your insurance to get a sleep test done at home  [email or web address] I sent a referral to them, but check out their website and you can reach out to schedule a virtual appt  If you prefer to do an in-lab sleep test I can also set that up for you!

## 2023-06-14 DIAGNOSIS — Z1151 Encounter for screening for human papillomavirus (HPV): Secondary | ICD-10-CM | POA: Diagnosis not present

## 2023-06-14 DIAGNOSIS — Z6836 Body mass index (BMI) 36.0-36.9, adult: Secondary | ICD-10-CM | POA: Diagnosis not present

## 2023-06-14 DIAGNOSIS — Z124 Encounter for screening for malignant neoplasm of cervix: Secondary | ICD-10-CM | POA: Diagnosis not present

## 2023-06-14 DIAGNOSIS — Z01419 Encounter for gynecological examination (general) (routine) without abnormal findings: Secondary | ICD-10-CM | POA: Diagnosis not present

## 2023-07-07 ENCOUNTER — Ambulatory Visit: Payer: BC Managed Care – PPO | Admitting: Physician Assistant

## 2023-07-07 ENCOUNTER — Encounter: Payer: Self-pay | Admitting: Physician Assistant

## 2023-07-07 VITALS — BP 122/89 | HR 81 | Temp 98.2°F | Ht 67.0 in | Wt 238.0 lb

## 2023-07-07 DIAGNOSIS — H66001 Acute suppurative otitis media without spontaneous rupture of ear drum, right ear: Secondary | ICD-10-CM

## 2023-07-07 DIAGNOSIS — J029 Acute pharyngitis, unspecified: Secondary | ICD-10-CM

## 2023-07-07 LAB — POCT RAPID STREP A (OFFICE): Rapid Strep A Screen: NEGATIVE

## 2023-07-07 MED ORDER — AMOXICILLIN-POT CLAVULANATE 875-125 MG PO TABS
1.0000 | ORAL_TABLET | Freq: Two times a day (BID) | ORAL | 0 refills | Status: AC
Start: 2023-07-07 — End: 2023-07-14

## 2023-07-07 NOTE — Progress Notes (Signed)
 Established patient visit   Patient: Katherine [name]   DOB: [birth year]   33 y.o. Female  MRN: [id number] Visit Date: 07/07/2023  Today's healthcare provider: Manuelita Flatness, PA-C   Cc. Sore throat, ear pain  Subjective     Pt reports nasal congestion, sore throat, cough for the last 1-2 weeks, the whole family has been sick. She reports yesterday/ today with worsened sore throat, and R ear pain/fullness.  Taking delsym, ibuprofen , tylenol  over the counter.   Medications: Outpatient Medications Prior to Visit  Medication Sig   SEMAGLUTIDE  PO Take by mouth. 5 units   SUMAtriptan  (IMITREX ) 100 MG tablet Take 1 tablet (100 mg total) by mouth every 2 (two) hours as needed for migraine. May repeat in 2 hours if headache persists or recurs.   tiZANidine  (ZANAFLEX ) 4 MG tablet Take 1 tablet (4 mg total) by mouth every 6 (six) hours as needed for muscle spasms.   No facility-administered medications prior to visit.    Review of Systems  Constitutional:  Negative for fatigue and fever.  HENT:  Positive for congestion, ear pain and sore throat.   Respiratory:  Positive for cough. Negative for shortness of breath.   Cardiovascular:  Negative for chest pain and leg swelling.  Gastrointestinal:  Negative for abdominal pain.  Neurological:  Negative for dizziness and headaches.       Objective    BP 122/89   Pulse 81   Temp 98.2 F (36.8 C) (Oral)   Ht 5' 7 (1.702 m)   Wt 238 lb (108 kg)   SpO2 98%   BMI 37.28 kg/m    Physical Exam Constitutional:      General: She is awake.     Appearance: She is well-developed.  HENT:     Head: Normocephalic.     Ears:     Comments: Bilateral TM with some slight injection, R TM is more erythematous    Mouth/Throat:     Pharynx: Posterior oropharyngeal erythema present.     Comments: Tonsils 2+ bilaterally Eyes:     Conjunctiva/sclera: Conjunctivae normal.  Cardiovascular:     Rate and Rhythm: Normal rate and regular  rhythm.     Heart sounds: Normal heart sounds.  Pulmonary:     Effort: Pulmonary effort is normal.     Breath sounds: Normal breath sounds.  Skin:    General: Skin is warm.  Neurological:     Mental Status: She is alert and oriented to person, place, and time.  Psychiatric:        Attention and Perception: Attention normal.        Mood and Affect: Mood normal.        Speech: Speech normal.        Behavior: Behavior is cooperative.      Results for orders placed or performed in visit on 07/07/23  POCT rapid strep A  Result Value Ref Range   Rapid Strep A Screen Negative Negative    Assessment & Plan    Non-recurrent acute suppurative otitis media of right ear without spontaneous rupture of tympanic membrane -     Amoxicillin -Pot Clavulanate; Take 1 tablet by mouth 2 (two) times daily for 7 days.  Dispense: 14 tablet; Refill: 0  Sore throat -     POCT rapid strep A   Poc strep negative  Will treat with augmentin  bid x 7 days Recommending rest, hydration, otc antihistamines  Return if symptoms worsen or fail to  improve.      Manuelita Flatness, PA-C  [facility] [phone] (phone) [phone] (fax)  [facility]

## 2023-07-27 DIAGNOSIS — R102 Pelvic and perineal pain: Secondary | ICD-10-CM | POA: Diagnosis not present

## 2023-07-31 ENCOUNTER — Ambulatory Visit
Admission: EM | Admit: 2023-07-31 | Discharge: 2023-07-31 | Disposition: A | Discharge status: Home / Self Care | Payer: BC Managed Care – PPO | Attending: Family Medicine | Admitting: Family Medicine

## 2023-07-31 ENCOUNTER — Encounter: Payer: Self-pay | Admitting: Family Medicine

## 2023-07-31 ENCOUNTER — Encounter: Payer: Self-pay | Admitting: Emergency Medicine

## 2023-07-31 ENCOUNTER — Ambulatory Visit: Payer: Self-pay | Admitting: Family Medicine

## 2023-07-31 DIAGNOSIS — R300 Dysuria: Secondary | ICD-10-CM | POA: Diagnosis not present

## 2023-07-31 DIAGNOSIS — R109 Unspecified abdominal pain: Secondary | ICD-10-CM | POA: Diagnosis not present

## 2023-07-31 DIAGNOSIS — N10 Acute tubulo-interstitial nephritis: Secondary | ICD-10-CM

## 2023-07-31 DIAGNOSIS — R319 Hematuria, unspecified: Secondary | ICD-10-CM | POA: Diagnosis not present

## 2023-07-31 LAB — POCT URINALYSIS DIP (MANUAL ENTRY)
Bilirubin, UA: NEGATIVE
Blood, UA: NEGATIVE
Glucose, UA: 100 mg/dL — AB
Ketones, POC UA: NEGATIVE mg/dL
Leukocytes, UA: NEGATIVE
Nitrite, UA: POSITIVE — AB
Protein Ur, POC: 30 mg/dL — AB
Spec Grav, UA: 1.02 (ref 1.010–1.025)
Urobilinogen, UA: 1 U/dL
pH, UA: 6.5 (ref 5.0–8.0)

## 2023-07-31 MED ORDER — SULFAMETHOXAZOLE-TRIMETHOPRIM 800-160 MG PO TABS: 1.0000 | ORAL_TABLET | Freq: Two times a day (BID) | ORAL | 0 refills | Status: AC

## 2023-07-31 NOTE — Telephone Encounter (Signed)
Copied from CRM [id number]. Topic: Clinical - Red Word Triage >> Jul 31, 2023 10:39 AM Florestine Avers wrote: Red Word that prompted transfer to Nurse Triage: Patient called in stating that she is having lower left back pain since last Sunday. She is also having symptoms of nausea, random bouts of intense pain in lower back area and burning with urination.  Chief Complaint: urinary symptoms Symptoms: pain, sharp pains in lower abd, left back pain Frequency: comes and goes Pertinent Negatives: Patient denies fever, difficulty urinating Disposition: []ED /[x]Urgent Care (no appt availability in office) / []Appointment(In office/virtual)/ [] [facility]/ []Home Care/ []Refused Recommended Disposition /[][facility]/ [] Follow-up with PCP Additional Notes: States symptoms started last Sunday.  States her pain can get up to a 10/10 at times.  States left side flank pain.  No apt available.  Instructed to go to UC.  Patient  states she has been taking AZO.  Care advice given, denies questions.  Instructed to go to er if becomes worse.   Reason for Disposition  Unusual vaginal discharge (e.g., bad smelling, yellow, green, or foamy-white)  Answer Assessment - Initial Assessment Questions 1. SEVERITY: "How bad is the pain?"  (e.g., Scale 1-10; mild, moderate, or severe)   - MILD (1-3): complains slightly about urination hurting   - MODERATE (4-7): interferes with normal activities     - SEVERE (8-10): excruciating, unwilling or unable to urinate because of the pain      10 /10 2. FREQUENCY: "How many times have you had painful urination today?"      1 x today 3. PATTERN: "Is pain present every time you urinate or just sometimes?"      Comes and goes 4. ONSET: "When did the painful urination start?"      Last Sunday 5. FEVER: "Do you have a fever?" If Yes, ask: "What is your temperature, how was it measured, and when did it start?"     denies 6. PAST UTI: "Have you had a urine  infection before?" If Yes, ask: "When was the last time?" and "What happened that time?"      yes 7. CAUSE: "What do you think is causing the painful urination?"  (e.g., UTI, scratch, Herpes sore)     uti 8. OTHER SYMPTOMS: "Do you have any other symptoms?" (e.g., blood in urine, flank pain, genital sores, urgency, vaginal discharge)     Flank pain on left side.  Protocols used: Urination Pain - Female-A-AH

## 2023-07-31 NOTE — Discharge Instructions (Addendum)

## 2023-07-31 NOTE — Telephone Encounter (Signed)
Pt was seen at [facility] today.

## 2023-07-31 NOTE — ED Triage Notes (Signed)
Pt presents after have flank pain and abdominal pain for one week. She also began to have urinary burning yesterday.

## 2023-07-31 NOTE — ED Provider Notes (Signed)
[name]    CSN: [id number] Arrival date & time: 07/31/23  1110      History   Chief Complaint Chief Complaint  Patient presents with   Abdominal Pain   Flank Pain    HPI Katherine [name] is a 33 y.o. female.   CARSYN [name] is a 33 y.o. female presenting for chief complaint of abdominal pain and left flank pain that started approximately 7 days ago and dysuria/urinary frequency that started 1-2 days ago. She had an episode of intense lower abdominal discomfort 4 days ago prompting concern for possible ruptured ovarian cyst and she went to the OB/GYN for transvaginal ultrasound the next day which was normal.  Urinalysis at OB/GYN 3 days ago showed some blood in the urine without overt signs of infection.  Symptoms have worsened over the last 3 days.  No recent falls, injuries, or trauma to the flank.  Denies diarrhea, dizziness, fever, chills, headaches, gross hematuria, urinary hesitancy, and bodyaches/fatigue.  She had an episode of nausea without vomiting 4 days ago, otherwise has been able to eat and drink normally.  Last menstrual cycle July 23, 2023.  Denies recent antibiotic or steroid use.  She has not attempted use of any over-the-counter medications to help with symptoms PTA.   Abdominal Pain Flank Pain Associated symptoms include abdominal pain.    Past Medical History:  Diagnosis Date   Anxiety    Asthma    Depression    Elevated blood pressure reading    Frequent headaches    Recurrent UTI     Patient Active Problem List   Diagnosis Date Noted   Preeclampsia, severe 03/01/2021   IUGR (intrauterine growth restriction) affecting care of mother 02/24/2021   Transient hypertension of pregnancy 02/18/2021   Overweight 01/14/2016    Past Surgical History:  Procedure Laterality Date   CESAREAN SECTION N/A 03/01/2021   Procedure: CESAREAN SECTION;  Surgeon: Lyn Henri, MD;  Location: [facility] LD ORS;  Service: Obstetrics;  Laterality:  N/A;   DILATION AND EVACUATION N/A 02/11/2020   Procedure: DILATATION AND EVACUATION;  Surgeon: Mitchel Honour, DO;  Location: [facility] OR;  Service: Gynecology;  Laterality: N/A;    OB History     Gravida  2   Para  1   Term      Preterm  1   AB  1   Living  1      SAB  1   IAB      Ectopic      Multiple  0   Live Births  1            Home Medications    Prior to Admission medications   Medication Sig Start Date End Date Taking? Authorizing Provider  sulfamethoxazole-trimethoprim (BACTRIM DS) 800-160 MG tablet Take 1 tablet by mouth 2 (two) times daily for 7 days. 07/31/23 08/07/23 Yes StanhopeDonavan Burnet, FNP  SEMAGLUTIDE PO Take by mouth. 5 units    [provider]  SUMAtriptan (IMITREX) 100 MG tablet Take 1 tablet (100 mg total) by mouth every 2 (two) hours as needed for migraine. May repeat in 2 hours if headache persists or recurs. Patient not taking: Reported on 07/31/2023 03/15/23   Sharlene Dory, DO  tiZANidine (ZANAFLEX) 4 MG tablet Take 1 tablet (4 mg total) by mouth every 6 (six) hours as needed for muscle spasms. Patient not taking: Reported on 07/31/2023 03/15/23   Sharlene Dory, DO    Family  History Family History  Problem Relation Age of Onset   Hypertension Father    Hypertension Brother     Social History Social History   Tobacco Use   Smoking status: Never   Smokeless tobacco: Never  Vaping Use   Vaping status: Never Used  Substance Use Topics   Alcohol use: Yes    Alcohol/week: 2.0 - 3.0 standard drinks of alcohol    Types: 2 - 3 Standard drinks or equivalent per week    Comment: occassionally   Drug use: No     Allergies   Prednisone   Review of Systems Review of Systems  Gastrointestinal:  Positive for abdominal pain.  Genitourinary:  Positive for flank pain.  Per HPI   Physical Exam Triage Vital Signs ED Triage Vitals  Encounter Vitals Group     BP 07/31/23 1136 121/83     Systolic BP  Percentile --      Diastolic BP Percentile --      Pulse Rate 07/31/23 1136 74     Resp 07/31/23 1136 18     Temp 07/31/23 1136 97.8 F (36.6 C)     Temp Source 07/31/23 1136 Oral     SpO2 07/31/23 1136 96 %     Weight --      Height --      Head Circumference --      Peak Flow --      Pain Score 07/31/23 1146 6     Pain Loc --      Pain Education --      Exclude from Growth Chart --    No data found.  Updated Vital Signs BP 121/83 (BP Location: Right Arm)   Pulse 74   Temp 97.8 F (36.6 C) (Oral)   Resp 18   LMP 07/23/2023 (Exact Date)   SpO2 96%   Visual Acuity Right Eye Distance:   Left Eye Distance:   Bilateral Distance:    Right Eye Near:   Left Eye Near:    Bilateral Near:     Physical Exam Vitals and nursing note reviewed.  Constitutional:      Appearance: She is not ill-appearing or toxic-appearing.  HENT:     Head: Normocephalic and atraumatic.     Right Ear: Hearing and external ear normal.     Left Ear: Hearing and external ear normal.     Nose: Nose normal.     Mouth/Throat:     Lips: Pink.  Eyes:     General: Lids are normal. Vision grossly intact. Gaze aligned appropriately.     Extraocular Movements: Extraocular movements intact.     Conjunctiva/sclera: Conjunctivae normal.  Pulmonary:     Effort: Pulmonary effort is normal.  Abdominal:     General: Bowel sounds are normal.     Palpations: Abdomen is soft.     Tenderness: There is no abdominal tenderness. There is left CVA tenderness. There is no right CVA tenderness or guarding.  Musculoskeletal:     Cervical back: Neck supple.  Skin:    General: Skin is warm and dry.     Capillary Refill: Capillary refill takes less than 2 seconds.     Findings: No rash.  Neurological:     General: No focal deficit present.     Mental Status: She is alert and oriented to person, place, and time. Mental status is at baseline.     Cranial Nerves: No dysarthria or facial asymmetry.  Psychiatric:  Mood and Affect: Mood normal.        Speech: Speech normal.        Behavior: Behavior normal.        Thought Content: Thought content normal.        Judgment: Judgment normal.      UC Treatments / Results  Labs (all labs ordered are listed, but only abnormal results are displayed) Labs Reviewed  POCT URINALYSIS DIP (MANUAL ENTRY) - Abnormal; Notable for the following components:      Result Value   Glucose, UA =100 (*)    Protein Ur, POC =30 (*)    Nitrite, UA Positive (*)    All other components within normal limits  URINE CULTURE    EKG   Radiology No results found.  Procedures Procedures (including critical care time)  Medications Ordered in UC Medications - No data to display  Initial Impression / Assessment and Plan / UC Course  I have reviewed the triage vital signs and the nursing notes.  Pertinent labs & imaging results that were available during my care of the patient were reviewed by me and considered in my medical decision making (see chart for details).   1. Pyelonephritis Presentation suspicious for pyelonephritis.  Urinalysis is nitrite positive with some protein and glucose, otherwise unremarkable. Given significant tenderness to the left flank on exam, persistent/worsening symptoms, I would like to go ahead and treat with Bactrim every 12 hours for the next 7 days. She is overall well-appearing with hemodynamically stable vital signs in clinic. Advised to increase water intake and take antibiotic with food as prescribed. Urine culture is pending and we will change antibiotic therapy as needed based on urine culture results.  Counseled patient on potential for adverse effects with medications prescribed/recommended today, strict ER and return-to-clinic precautions discussed, patient verbalized understanding.    Final Clinical Impressions(s) / UC Diagnoses   Final diagnoses:  Acute pyelonephritis     Discharge Instructions      Your urine  shows you likely have a urinary tract infection.  I have sent your urine for culture to confirm this.  We will call you if we need to change your antibiotic when we find out the type of bacteria growing in your bladder.  Take antibiotic as directed with a snack/food to avoid stomach upset. To avoid GI upset please take this medication with food.   Avoid drinking beverages that irritate the urinary tract like sodas, tea, coffee, or juice. Drink plenty of water to stay well hydrated and prevent severe infection.  If you develop any new or worsening symptoms or if your symptoms do not start to improve, pleases return here or follow-up with your primary care provider. If your symptoms are severe, please go to the emergency room.     ED Prescriptions     Medication Sig Dispense Auth. Provider   sulfamethoxazole-trimethoprim (BACTRIM DS) 800-160 MG tablet Take 1 tablet by mouth 2 (two) times daily for 7 days. 14 tablet Carlisle Beers, FNP      PDMP not reviewed this encounter.   Carlisle Beers, [place] 07/31/23 1209

## 2023-08-01 ENCOUNTER — Telehealth: Payer: Self-pay | Admitting: Family Medicine

## 2023-08-01 LAB — URINE CULTURE

## 2023-08-01 NOTE — Telephone Encounter (Signed)
PATIENT WOULD LIKE A CALL BACK REGARDING her kidney infection she would like to know what are the next steps  she needs to take

## 2023-08-02 ENCOUNTER — Encounter: Payer: Self-pay | Admitting: Family Medicine

## 2023-08-02 ENCOUNTER — Ambulatory Visit: Payer: BC Managed Care – PPO | Admitting: Family Medicine

## 2023-08-02 NOTE — Telephone Encounter (Signed)
This is a urine collection that the pt is referring to. She was seen on 07/31/23 at [facility]. Please advise.

## 2023-10-06 NOTE — Progress Notes (Signed)
 [facility] [address] [place], [place] [address] [phone]  Date:  10/11/2023   Name:  Katherine [name]   DOB:  03/31/1991   MRN:  [id number]  PCP:  Pearline Cables, MD    Chief Complaint: Anxiety (Pt says she feels like she is on edge all the time. She has taken Zoloft in the past. It feels more like Anxiety than depression. )   History of Present Illness:  Katherine [name] is a 33 y.o. very pleasant female patient who presents with the following:  Pt seen today with concern of anxiety/ depression Last seen by myself in October for follow-up   Today she notes she is feeling very anxious and more irritable -she would like to try and improve her mood as she worries it could affect her family She has noted these sx for a few months, worse the last month or so Her daughter is 37.67 years old-she will soon be out of school for the summer and home all the time She does not typically feel depressed- sometimes has some anhedonia but this is not a major feature Her energy level is good this week No suicidal ideation  She is sleeping well overall Appetite is under control with semaglutide -she has lost quite a bit of weight which is great news  She did use zoloft in the past but felt like it make her a bit "numb" She also used wellbutrin previously but it made her feel anxious Her sister used prozac -this has worked well for her.  Wt Readings from Last 3 Encounters:  10/11/23 217 lb (98.4 kg)  07/07/23 238 lb (108 kg)  04/17/23 240 lb (108.9 kg)      Patient Active Problem List   Diagnosis Date Noted   Preeclampsia, severe 03/01/2021   IUGR (intrauterine growth restriction) affecting care of mother 02/24/2021   Transient hypertension of pregnancy 02/18/2021   Overweight 01/14/2016    Past Medical History:  Diagnosis Date   Anxiety    Asthma    Depression    Elevated blood pressure reading     Frequent headaches    Recurrent UTI     Past Surgical History:  Procedure Laterality Date   CESAREAN SECTION N/A 03/01/2021   Procedure: CESAREAN SECTION;  Surgeon: Lyn Henri, MD;  Location: [facility] LD ORS;  Service: Obstetrics;  Laterality: N/A;   DILATION AND EVACUATION N/A 02/11/2020   Procedure: DILATATION AND EVACUATION;  Surgeon: Mitchel Honour, DO;  Location: [facility] OR;  Service: Gynecology;  Laterality: N/A;    Social History   Tobacco Use   Smoking status: Never   Smokeless tobacco: Never  Vaping Use   Vaping status: Never Used  Substance Use Topics   Alcohol use: Yes    Alcohol/week: 2.0 - 3.0 standard drinks of alcohol    Types: 2 - 3 Standard drinks or equivalent per week    Comment: occassionally   Drug use: No    Family History  Problem Relation Age of Onset   Hypertension Father    Hypertension Brother     Allergies  Allergen Reactions   Prednisone Rash and Other (See Comments)    Pt states "It makes me feel weird.    Medication list has been reviewed and updated.  Current Outpatient Medications on File Prior to Visit  Medication Sig Dispense Refill   cetirizine (ZYRTEC) 10 MG tablet Take 10 mg by  mouth daily.     SEMAGLUTIDE PO Take by mouth. 5 units     No current facility-administered medications on file prior to visit.    Review of Systems:  As per HPI- otherwise negative.   Physical Examination: Vitals:   10/11/23 1051  BP: 118/80  Pulse: 89  Resp: 18  Temp: 98.1 F (36.7 C)  SpO2: 99%   Vitals:   10/11/23 1051  Weight: 217 lb (98.4 kg)  Height: 5\' 7"  (1.702 m)   Body mass index is 33.99 kg/m. Ideal Body Weight: Weight in (lb) to have BMI = 25: 159.3  GEN: no acute distress.  Mildly obese, looks well HEENT: Atraumatic, Normocephalic.  Ears and Nose: No external deformity. CV: RRR, No M/G/R. No JVD. No thrill. No extra heart sounds. PULM: CTA B, no wheezes, crackles, rhonchi. No retractions. No resp. distress. No accessory  muscle use. ABD: S, NT, ND EXTR: No c/c/e PSYCH: Normally interactive. Conversant.    Assessment and Plan: Adjustment disorder with anxious mood - Plan: FLUoxetine (PROZAC) 20 MG capsule  Vitamin D deficiency - Plan: VITAMIN D 25 Hydroxy (Vit-D Deficiency, Fractures)  Screening for deficiency anemia - Plan: CBC  Chronic fatigue - Plan: TSH, Vitamin B12  Screening for diabetes mellitus - Plan: Basic metabolic panel with GFR, Hemoglobin A1c  Screening, lipid - Plan: Lipid panel  Patient seen today with concern of anxiety and some depression.  Her sister is using fluoxetine with good results, gave her prescription for 20 mg.  Went over the most common side effects.  She can titrate to 40 mg in a couple of weeks as needed, she will let me know how this works for her  Will also obtain routine lab work as above, patient notes fatigue-check thyroid and B12.  History of vitamin D deficiency  Signed Abbe Amsterdam, MD  Received labs as below, message to patient  Results for orders placed or performed in visit on 10/11/23  Basic metabolic panel with GFR   Collection Time: 10/11/23 11:16 AM  Result Value Ref Range   Sodium 139 135 - 145 mEq/L   Potassium 4.7 3.5 - 5.1 mEq/L   Chloride 105 96 - 112 mEq/L   CO2 28 19 - 32 mEq/L   Glucose, Bld 85 70 - 99 mg/dL   BUN 10 6 - 23 mg/dL   Creatinine, Ser 1.61 0.40 - 1.20 mg/dL   GFR 096.04 >54.09 mL/min   Calcium 9.4 8.4 - 10.5 mg/dL  Hemoglobin W1X   Collection Time: 10/11/23 11:16 AM  Result Value Ref Range   Hgb A1c MFr Bld 4.9 4.6 - 6.5 %  Lipid panel   Collection Time: 10/11/23 11:16 AM  Result Value Ref Range   Cholesterol 127 0 - 200 mg/dL   Triglycerides 91.4 0.0 - 149.0 mg/dL   HDL 78.29 >56.21 mg/dL   VLDL 30.8 0.0 - 65.7 mg/dL   LDL Cholesterol 71 0 - 99 mg/dL   Total CHOL/HDL Ratio 3    NonHDL 87.65   TSH   Collection Time: 10/11/23 11:16 AM  Result Value Ref Range   TSH 1.20 0.35 - 5.50 uIU/mL  VITAMIN D 25  Hydroxy (Vit-D Deficiency, Fractures)   Collection Time: 10/11/23 11:16 AM  Result Value Ref Range   VITD 24.51 (L) 30.00 - 100.00 ng/mL  Vitamin B12   Collection Time: 10/11/23 11:16 AM  Result Value Ref Range   Vitamin B-12 332 211 - 911 pg/mL  CBC   Collection Time: 10/11/23  11:16 AM  Result Value Ref Range   WBC 7.0 4.0 - 10.5 K/uL   RBC 4.76 3.87 - 5.11 Mil/uL   Platelets 261.0 150.0 - 400.0 K/uL   Hemoglobin 14.3 12.0 - 15.0 g/dL   HCT 40.9 81.1 - 91.4 %   MCV 89.5 78.0 - 100.0 fl   MCHC 33.5 30.0 - 36.0 g/dL   RDW 78.2 95.6 - 21.3 %

## 2023-10-11 ENCOUNTER — Encounter: Payer: Self-pay | Admitting: Family Medicine

## 2023-10-11 ENCOUNTER — Ambulatory Visit: Admitting: Family Medicine

## 2023-10-11 VITALS — BP 118/80 | HR 89 | Temp 98.1°F | Resp 18 | Ht 67.0 in | Wt 217.0 lb

## 2023-10-11 DIAGNOSIS — R5382 Chronic fatigue, unspecified: Secondary | ICD-10-CM

## 2023-10-11 DIAGNOSIS — Z131 Encounter for screening for diabetes mellitus: Secondary | ICD-10-CM | POA: Diagnosis not present

## 2023-10-11 DIAGNOSIS — Z130 Encounter for screening for diseases of the blood and blood-forming organs and certain disorders involving the immune mechanism: Secondary | ICD-10-CM | POA: Diagnosis not present

## 2023-10-11 DIAGNOSIS — F4322 Adjustment disorder with anxiety: Secondary | ICD-10-CM

## 2023-10-11 DIAGNOSIS — Z13220 Encounter for screening for lipoid disorders: Secondary | ICD-10-CM | POA: Diagnosis not present

## 2023-10-11 DIAGNOSIS — E559 Vitamin D deficiency, unspecified: Secondary | ICD-10-CM | POA: Diagnosis not present

## 2023-10-11 LAB — BASIC METABOLIC PANEL WITH GFR
BUN: 10 mg/dL (ref 6–23)
CO2: 28 meq/L (ref 19–32)
Calcium: 9.4 mg/dL (ref 8.4–10.5)
Chloride: 105 meq/L (ref 96–112)
Creatinine, Ser: 0.77 mg/dL (ref 0.40–1.20)
GFR: 101.3 mL/min (ref >60.00)
Glucose, Bld: 85 mg/dL (ref 70–99)
Potassium: 4.7 meq/L (ref 3.5–5.1)
Sodium: 139 meq/L (ref 135–145)

## 2023-10-11 LAB — VITAMIN D 25 HYDROXY (VIT D DEFICIENCY, FRACTURES): VITD: 24.51 ng/mL — ABNORMAL LOW (ref 30.00–100.00)

## 2023-10-11 LAB — LIPID PANEL
Cholesterol: 127 mg/dL (ref 0–200)
HDL: 39.1 mg/dL (ref >39.00)
LDL Cholesterol: 71 mg/dL (ref 0–99)
NonHDL: 87.65
Total CHOL/HDL Ratio: 3
Triglycerides: 85 mg/dL (ref 0.0–149.0)
VLDL: 17 mg/dL (ref 0.0–40.0)

## 2023-10-11 LAB — CBC
HCT: 42.6 % (ref 36.0–46.0)
Hemoglobin: 14.3 g/dL (ref 12.0–15.0)
MCHC: 33.5 g/dL (ref 30.0–36.0)
MCV: 89.5 fl (ref 78.0–100.0)
Platelets: 261 10*3/uL (ref 150.0–400.0)
RBC: 4.76 Mil/uL (ref 3.87–5.11)
RDW: 13.9 % (ref 11.5–15.5)
WBC: 7 10*3/uL (ref 4.0–10.5)

## 2023-10-11 LAB — TSH: TSH: 1.2 u[IU]/mL (ref 0.35–5.50)

## 2023-10-11 LAB — HEMOGLOBIN A1C: Hgb A1c MFr Bld: 4.9 % (ref 4.6–6.5)

## 2023-10-11 LAB — VITAMIN B12: Vitamin B-12: 332 pg/mL (ref 211–911)

## 2023-10-11 MED ORDER — FLUOXETINE HCL 20 MG PO CAPS: 20.0000 mg | ORAL_CAPSULE | Freq: Every day | ORAL | 3 refills | Status: DC

## 2023-10-11 NOTE — Patient Instructions (Signed)
 Good to see you today!  Please let me know how you do with the fluoxetine- start with 20 and go up to 40 if needed/ desired in a week or so.  Please keep me updated via mychart

## 2023-11-07 ENCOUNTER — Ambulatory Visit: Admitting: Family

## 2023-11-07 ENCOUNTER — Encounter: Payer: Self-pay | Admitting: Family

## 2023-11-07 VITALS — BP 124/84 | HR 73 | Temp 97.7°F | Ht 67.0 in | Wt 212.4 lb

## 2023-11-07 DIAGNOSIS — H6690 Otitis media, unspecified, unspecified ear: Secondary | ICD-10-CM | POA: Diagnosis not present

## 2023-11-07 MED ORDER — FLUTICASONE PROPIONATE 50 MCG/ACT NA SUSP: 2.0000 | Freq: Every day | NASAL | 6 refills | Status: DC

## 2023-11-07 MED ORDER — AMOXICILLIN-POT CLAVULANATE 875-125 MG PO TABS: 1.0000 | ORAL_TABLET | Freq: Two times a day (BID) | ORAL | 0 refills | Status: AC

## 2023-11-07 NOTE — Progress Notes (Signed)
 Katherine [name] is a 33 y.o. female with the following history as recorded in EpicCare:  Patient Active Problem List   Diagnosis Date Noted   Preeclampsia, severe 03/01/2021   IUGR (intrauterine growth restriction) affecting care of mother 02/24/2021   Transient hypertension of pregnancy 02/18/2021   Overweight 01/14/2016    Current Outpatient Medications  Medication Sig Dispense Refill   amoxicillin -clavulanate (AUGMENTIN ) 875-125 MG tablet Take 1 tablet by mouth 2 (two) times daily for 7 days. 14 tablet 0   cetirizine (ZYRTEC) 10 MG tablet Take 10 mg by mouth daily.     FLUoxetine  (PROZAC ) 20 MG capsule Take 1 capsule (20 mg total) by mouth daily. Increase to 40 mg after 2 weeks 90 capsule 3   fluticasone  (FLONASE ) 50 MCG/ACT nasal spray Place 2 sprays into both nostrils daily. 16 g 6   SEMAGLUTIDE -WEIGHT MANAGEMENT [place] Inject into the skin.     No current facility-administered medications for this visit.    Allergies: Prednisone   Past Medical History:  Diagnosis Date   Anxiety    Asthma    Depression    Elevated blood pressure reading    Frequent headaches    Recurrent UTI     Past Surgical History:  Procedure Laterality Date   CESAREAN SECTION N/A 03/01/2021   Procedure: CESAREAN SECTION;  Surgeon: Gretchen Leavell, MD;  Location: [facility] LD [facility];  Service: Obstetrics;  Laterality: N/A;   DILATION AND EVACUATION N/A 02/11/2020   Procedure: DILATATION AND EVACUATION;  Surgeon: Dyanna Glasgow, DO;  Location: [facility] OR;  Service: Gynecology;  Laterality: N/A;    Family History  Problem Relation Age of Onset   Hypertension Father    Hypertension Brother     Social History   Tobacco Use   Smoking status: Never   Smokeless tobacco: Never  Substance Use Topics   Alcohol use: Yes    Alcohol/week: 2.0 - 3.0 standard drinks of alcohol    Types: 2 - 3 Standard drinks or equivalent per week    Comment: occassionally    Subjective:   Concern for possible right ear infection-  right ear pain started last night; congestion x 10 days prior; +sinus pain/ pressure; + facial pressure;   Objective:  Vitals:   11/07/23 0929  BP: 124/84  Pulse: 73  Temp: 97.7 F (36.5 C)  TempSrc: Oral  SpO2: 98%  Weight: 212 lb 6.4 oz (96.3 kg)  Height: 5\' 7"  (1.702 m)    General: Well developed, well nourished, in no acute distress  Skin : Warm and dry.  Head: Normocephalic and atraumatic  Eyes: Sclera and conjunctiva clear; pupils round and reactive to light; extraocular movements intact  Ears: External normal; canals clear; R tympanic membranes erythematous/ left TM congested Oropharynx: Pink, supple. No suspicious lesions  Neck: Supple without thyromegaly, adenopathy  Lungs: Respirations unlabored; clear to auscultation bilaterally without wheeze, rales, rhonchi  CVS exam: normal rate and regular rhythm.  Neurologic: Alert and oriented; speech intact; face symmetrical; moves all extremities well; CNII-XII intact without focal deficit   Assessment:  1. Acute otitis media, unspecified otitis media type     Plan:  Rx for Augmentin  875 mg bid x 7 days; Rx for Flonase  NS; increase fluids, rest and follow up worse, no better.   No follow-ups on file.  No orders of the defined types were placed in this encounter.   Requested Prescriptions   Signed Prescriptions Disp Refills   amoxicillin -clavulanate (AUGMENTIN ) 875-125 MG tablet 14 tablet 0  Sig: Take 1 tablet by mouth 2 (two) times daily for 7 days.   fluticasone  (FLONASE ) 50 MCG/ACT nasal spray 16 g 6    Sig: Place 2 sprays into both nostrils daily.

## 2023-11-09 ENCOUNTER — Encounter: Payer: Self-pay | Admitting: Family

## 2023-11-09 ENCOUNTER — Other Ambulatory Visit: Payer: Self-pay | Admitting: Family

## 2023-11-09 MED ORDER — PREDNISONE 20 MG PO TABS: 20.0000 mg | ORAL_TABLET | Freq: Every day | ORAL | 0 refills | Status: DC

## 2023-11-14 ENCOUNTER — Other Ambulatory Visit: Payer: Self-pay | Admitting: Family

## 2023-11-14 ENCOUNTER — Encounter: Payer: Self-pay | Admitting: Family

## 2023-11-14 MED ORDER — PREDNISONE 20 MG PO TABS
20.0000 mg | ORAL_TABLET | Freq: Every day | ORAL | 0 refills | Status: DC
Start: 2023-11-14 — End: 2024-05-13

## 2024-01-18 ENCOUNTER — Telehealth: Payer: Self-pay | Admitting: Family Medicine

## 2024-01-18 ENCOUNTER — Ambulatory Visit: Payer: Self-pay

## 2024-01-18 NOTE — Telephone Encounter (Unsigned)
 Copied from CRM [id number]. Topic: Clinical - Red Word Triage >> Jan 18, 2024  9:43 AM Carlyon D wrote: Red Word that prompted transfer to Nurse Triage: Left back kidney pain, dizziness, Vertigo when moving her head certain ways.

## 2024-01-18 NOTE — Telephone Encounter (Signed)
 Pt had scheduled online for 01/24/24.  Pt states she is currently out of town. Instructed to keep appt, but if symptoms get worse to go to UC in the area she is in.

## 2024-01-18 NOTE — Telephone Encounter (Signed)
 FYI Only or Action Required?: FYI only for provider.  Patient was last seen in primary care on 11/07/2023 by Jason Leita Repine, FNP.  Called Nurse Triage reporting Dizziness.  Symptoms began several weeks ago.  Interventions attempted: Nothing.  Symptoms are: unchanged.  Triage Disposition: See PCP When Office is Open (Within 3 Days)  Patient/caregiver understands and will follow disposition?: Yes    Copied from CRM [id number]. Topic: Clinical - Red Word Triage >> Jan 18, 2024  9:43 AM Carlyon D wrote: Red Word that prompted transfer to Nurse Triage: Left back kidney pain, dizziness, Vertigo when moving her head certain ways. Reason for Disposition  [1] MILD dizziness (e.g., vertigo; walking normally) AND [2] has NOT been evaluated by doctor (or NP/PA) for this  Answer Assessment - Initial Assessment Questions 1. DESCRIPTION: Describe your dizziness.     Gets dizzy 2. VERTIGO: Do you feel like either you or the room is spinning or tilting?      Like she is spinning 3. LIGHTHEADED: Do you feel lightheaded? (e.g., somewhat faint, woozy, weak upon standing)     Some times lightheaded 4. SEVERITY: How bad is it?  Can you walk?     mild 5. ONSET:  When did the dizziness begin?     Months ago 6. AGGRAVATING FACTORS: Does anything make it worse? (e.g., standing, change in head position)     Head position 7. CAUSE: What do you think is causing the dizziness?     unknown 8. RECURRENT SYMPTOM: Have you had dizziness before? If Yes, ask: When was the last time? What happened that time?     Yesterday  9. OTHER SYMPTOMS: Do you have any other symptoms? (e.g., earache, headache, numbness, tinnitus, vomiting, weakness)     Ringing in ears, and back pain  Protocols used: Dizziness - Vertigo-A-AH

## 2024-01-20 NOTE — Progress Notes (Unsigned)
 [facility] [address], [address] [place], [place] [address] [phone]  Date:  01/24/2024   Name:  Katherine [name]   DOB:  1990/08/10   MRN:  [id number]  PCP:  Watt Harlene BROCKS, MD    Chief Complaint: No chief complaint on file.   History of Present Illness:  Katherine [name] is a 33 y.o. very pleasant female patient who presents with the following:  Patient seen today with concern of persistent ear symptoms since her recent ear infection Most recent visit with myself was in April-at that time she was dealing with some anxiety and irritability, we started her on fluoxetine  which her sister was using with good results  She saw my partner Leita Elbe in early May-diagnosed with otitis media and was treated with Augmentin   Patient Active Problem List   Diagnosis Date Noted   Preeclampsia, severe 03/01/2021   IUGR (intrauterine growth restriction) affecting care of mother 02/24/2021   Transient hypertension of pregnancy 02/18/2021   Overweight 01/14/2016    Past Medical History:  Diagnosis Date   Anxiety    Asthma    Depression    Elevated blood pressure reading    Frequent headaches    Recurrent UTI     Past Surgical History:  Procedure Laterality Date   CESAREAN SECTION N/A 03/01/2021   Procedure: CESAREAN SECTION;  Surgeon: Lequita Evalene LABOR, MD;  [facility]: [facility] LD [facility];  Service: Obstetrics;  Laterality: N/A;   DILATION AND EVACUATION N/A 02/11/2020   Procedure: DILATATION AND EVACUATION;  Surgeon: Dannielle Bouchard, DO;  Location: [facility] OR;  Service: Gynecology;  Laterality: N/A;    Social History   Tobacco Use   Smoking status: Never   Smokeless tobacco: Never  Vaping Use   Vaping status: Never Used  Substance Use Topics   Alcohol use: Yes    Alcohol/week: 2.0 - 3.0 standard drinks of alcohol    Types: 2 - 3 Standard drinks or equivalent per week    Comment: occassionally   Drug use: No    Family  History  Problem Relation Age of Onset   Hypertension Father    Hypertension Brother     Allergies  Allergen Reactions   Prednisone  Other (See Comments)    Pt states It makes me feel weird.    Medication list has been reviewed and updated.  Current Outpatient Medications on File Prior to Visit  Medication Sig Dispense Refill   cetirizine (ZYRTEC) 10 MG tablet Take 10 mg by mouth daily.     FLUoxetine  (PROZAC ) 20 MG capsule Take 1 capsule (20 mg total) by mouth daily. Increase to 40 mg after 2 weeks 90 capsule 3   fluticasone  (FLONASE ) 50 MCG/ACT nasal spray Place 2 sprays into both nostrils daily. 16 g 6   predniSONE  (DELTASONE ) 20 MG tablet Take 1 tablet (20 mg total) by mouth daily with breakfast. 3 tablet 0   SEMAGLUTIDE -WEIGHT MANAGEMENT [place] Inject into the skin.     No current facility-administered medications on file prior to visit.    Review of Systems:  As per HPI- otherwise negative.   Physical Examination: There were no vitals filed for this visit. There were no vitals filed for this visit. There is no height or weight on file to calculate BMI. Ideal Body Weight:    GEN: no acute distress. HEENT: Atraumatic, Normocephalic.  Ears and Nose: No external deformity. CV: RRR, No M/G/R. No JVD. No  thrill. No extra heart sounds. PULM: CTA B, no wheezes, crackles, rhonchi. No retractions. No resp. distress. No accessory muscle use. ABD: S, NT, ND, +BS. No rebound. No HSM. EXTR: No c/c/e PSYCH: Normally interactive. Conversant.    Assessment and Plan: ***  Signed Harlene Schroeder, MD

## 2024-01-20 NOTE — Patient Instructions (Incomplete)
 It was great to see you again today I would suggest taking 2000 units of vit D daily I will set you up for a CT or MRI angiogram - will check on which is the best choice for you CT might easiest for you   I changed your fluoxetine  to 40 mg - just take one of these

## 2024-01-24 ENCOUNTER — Ambulatory Visit: Admitting: Family Medicine

## 2024-01-24 ENCOUNTER — Encounter: Payer: Self-pay | Admitting: Family Medicine

## 2024-01-24 VITALS — BP 132/86 | HR 78 | Ht 67.0 in | Wt 209.0 lb

## 2024-01-24 DIAGNOSIS — F4322 Adjustment disorder with anxiety: Secondary | ICD-10-CM | POA: Diagnosis not present

## 2024-01-24 DIAGNOSIS — R42 Dizziness and giddiness: Secondary | ICD-10-CM

## 2024-01-24 MED ORDER — FLUOXETINE HCL 40 MG PO CAPS: 40.0000 mg | ORAL_CAPSULE | Freq: Every day | ORAL | 3 refills | Status: AC

## 2024-01-24 MED ORDER — MECLIZINE HCL 25 MG PO TABS: 25.0000 mg | ORAL_TABLET | Freq: Three times a day (TID) | ORAL | 0 refills | Status: DC | PRN

## 2024-01-25 ENCOUNTER — Encounter: Payer: Self-pay | Admitting: Family Medicine

## 2024-01-25 DIAGNOSIS — H93A9 Pulsatile tinnitus, unspecified ear: Secondary | ICD-10-CM

## 2024-01-29 ENCOUNTER — Other Ambulatory Visit: Payer: Self-pay

## 2024-01-29 ENCOUNTER — Emergency Department (HOSPITAL_BASED_OUTPATIENT_CLINIC_OR_DEPARTMENT_OTHER)

## 2024-01-29 ENCOUNTER — Emergency Department (HOSPITAL_BASED_OUTPATIENT_CLINIC_OR_DEPARTMENT_OTHER)
Admission: EM | Admit: 2024-01-29 | Discharge: 2024-01-29 | Disposition: A | Discharge status: Home / Self Care | Attending: Emergency Medicine | Admitting: Emergency Medicine

## 2024-01-29 ENCOUNTER — Encounter (HOSPITAL_BASED_OUTPATIENT_CLINIC_OR_DEPARTMENT_OTHER): Payer: Self-pay

## 2024-01-29 ENCOUNTER — Ambulatory Visit: Payer: Self-pay | Admitting: *Deleted

## 2024-01-29 DIAGNOSIS — R0789 Other chest pain: Secondary | ICD-10-CM | POA: Diagnosis not present

## 2024-01-29 DIAGNOSIS — M25512 Pain in left shoulder: Secondary | ICD-10-CM | POA: Diagnosis not present

## 2024-01-29 DIAGNOSIS — R519 Headache, unspecified: Secondary | ICD-10-CM | POA: Diagnosis not present

## 2024-01-29 DIAGNOSIS — R110 Nausea: Secondary | ICD-10-CM | POA: Diagnosis not present

## 2024-01-29 DIAGNOSIS — R079 Chest pain, unspecified: Secondary | ICD-10-CM | POA: Diagnosis not present

## 2024-01-29 LAB — CBC
HCT: 41.9 % (ref 36.0–46.0)
Hemoglobin: 14.1 g/dL (ref 12.0–15.0)
MCH: 29.6 pg (ref 26.0–34.0)
MCHC: 33.7 g/dL (ref 30.0–36.0)
MCV: 87.8 fL (ref 80.0–100.0)
Platelets: 265 K/uL (ref 150–400)
RBC: 4.77 MIL/uL (ref 3.87–5.11)
RDW: 12.9 % (ref 11.5–15.5)
WBC: 5.4 K/uL (ref 4.0–10.5)
nRBC: 0 % (ref 0.0–0.2)

## 2024-01-29 LAB — BASIC METABOLIC PANEL WITH GFR
Anion gap: 10 (ref 5–15)
BUN: 10 mg/dL (ref 6–20)
CO2: 26 mmol/L (ref 22–32)
Calcium: 9.6 mg/dL (ref 8.9–10.3)
Chloride: 103 mmol/L (ref 98–111)
Creatinine, Ser: 0.81 mg/dL (ref 0.44–1.00)
GFR, Estimated: >60 mL/min (ref >60)
Glucose, Bld: 88 mg/dL (ref 70–99)
Potassium: 4.3 mmol/L (ref 3.5–5.1)
Sodium: 139 mmol/L (ref 135–145)

## 2024-01-29 LAB — TROPONIN T, HIGH SENSITIVITY
Troponin T High Sensitivity: <15 ng/L (ref <19)
Troponin T High Sensitivity: <15 ng/L (ref <19)

## 2024-01-29 LAB — HCG, SERUM, QUALITATIVE: Preg, Serum: NEGATIVE

## 2024-01-29 MED ORDER — KETOROLAC TROMETHAMINE 15 MG/ML IJ SOLN
30.0000 mg | Freq: Once | INTRAMUSCULAR | Status: AC
  Administered 2024-01-29: 30 mg via INTRAVENOUS
  Filled 2024-01-29: qty 2

## 2024-01-29 NOTE — Discharge Instructions (Addendum)
 Today you were seen for chest pain.  Please follow-up with your PCP for further evaluation and workup of your vertigo symptoms.  Please follow-up with cardiology for further evaluation and workup of your chest pain..  Thank you for letting us  treat you today. After reviewing your labs and imaging, I feel you are safe to go home. Please follow up with your PCP in the next several days and provide them with your records from this visit. Return to the Emergency Room if pain becomes severe or symptoms worsen.

## 2024-01-29 NOTE — ED Triage Notes (Signed)
 Pt reports chest pain and left shoulder pain that started yesterday around 1430. Pt then had HA and nausea yesterday. Pain now 6/10 pain.

## 2024-01-29 NOTE — ED Provider Notes (Signed)
 [facility] Provider Note   CSN: [id number] Arrival date & time: 01/29/24  1124     Patient presents with: Chest Pain   Katherine [name] is a 33 y.o. female presents today for chest and left shoulder pain that began yesterday around 2:30 in the afternoon.  Patient also had a headache and nausea yesterday.  She states that this headache felt like her usual headache.  Patient has also been having intermittent episodes of dizziness that she describes as the room spinning around her.  Patient is being seen by her primary care for same who has recently ordered an MRI for the patient.  Patient denies numbness, weakness, fever, chills, abdominal pain, shortness of breath, any other complaints at this time.    Chest Pain Associated symptoms: nausea        Prior to Admission medications   Medication Sig Start Date End Date Taking? Authorizing Provider  cetirizine (ZYRTEC) 10 MG tablet Take 10 mg by mouth daily.    [provider]  FLUoxetine  (PROZAC ) 40 MG capsule Take 1 capsule (40 mg total) by mouth daily. 01/24/24   Copland, Harlene BROCKS, MD  fluticasone  (FLONASE ) 50 MCG/ACT nasal spray Place 2 sprays into both nostrils daily. 11/07/23   Jason Leita Repine, FNP  meclizine  (ANTIVERT ) 25 MG tablet Take 1 tablet (25 mg total) by mouth 3 (three) times daily as needed for dizziness. 01/24/24   Copland, Harlene BROCKS, MD  predniSONE  (DELTASONE ) 20 MG tablet Take 1 tablet (20 mg total) by mouth daily with breakfast. 11/14/23   Jason Leita Repine, FNP  SEMAGLUTIDE -WEIGHT MANAGEMENT [place] Inject into the skin.    [provider]    Allergies: Prednisone     Review of Systems  Cardiovascular:  Positive for chest pain.  Gastrointestinal:  Positive for nausea.    Updated Vital Signs BP (!) 137/100   Pulse 80   Temp (!) 97.5 F (36.4 C) (Oral)   Resp 13   Ht 5' 7 (1.702 m)   Wt 93.9 kg   LMP 01/24/2024 (Exact Date)   SpO2 100%    BMI 32.42 kg/m   Physical Exam Vitals and nursing note reviewed.  Constitutional:      General: She is not in acute distress.    Appearance: She is well-developed. She is not ill-appearing.  HENT:     Head: Normocephalic and atraumatic.  Eyes:     General: Vision grossly intact. No visual field deficit.    Extraocular Movements: Extraocular movements intact.     Right eye: No nystagmus.     Left eye: No nystagmus.     Conjunctiva/sclera: Conjunctivae normal.  Cardiovascular:     Rate and Rhythm: Normal rate and regular rhythm.     Heart sounds: No murmur heard. Pulmonary:     Effort: Pulmonary effort is normal. No respiratory distress.     Breath sounds: Normal breath sounds.  Abdominal:     Palpations: Abdomen is soft.     Tenderness: There is no abdominal tenderness.  Musculoskeletal:        General: No swelling.     Cervical back: Neck supple.     Right lower leg: No edema.     Left lower leg: No edema.  Skin:    General: Skin is warm and dry.     Capillary Refill: Capillary refill takes less than 2 seconds.  Neurological:     General: No focal deficit present.  Mental Status: She is alert and oriented to person, place, and time.     GCS: GCS eye subscore is 4. GCS verbal subscore is 5. GCS motor subscore is 6.     Cranial Nerves: Cranial nerves 2-12 are intact. No facial asymmetry.     Motor: No weakness.     Gait: Gait is intact.  Psychiatric:        Mood and Affect: Mood normal.     (all labs ordered are listed, but only abnormal results are displayed) Labs Reviewed  BASIC METABOLIC PANEL WITH GFR  CBC  HCG, SERUM, QUALITATIVE  TROPONIN T, HIGH SENSITIVITY  TROPONIN T, HIGH SENSITIVITY    EKG: EKG Interpretation Date/Time:  Monday January 29 2024 11:35:28 EDT Ventricular Rate:  83 PR Interval:  162 QRS Duration:  99 QT Interval:  378 QTC Calculation: 445 R Axis:   63  Text Interpretation: Sinus rhythm no acute ST/T changes Confirmed by Freddi Hamilton [phone]) on 01/29/2024 11:55:13 AM  Radiology: [facility] Chest 2 View Result Date: 01/29/2024 CLINICAL DATA:  Chest pain EXAM: CHEST - 2 VIEW COMPARISON:  None Available. FINDINGS: No consolidation, pneumothorax or effusion. No edema. Overlapping cardiac leads. Normal cardiopericardial silhouette. IMPRESSION: No acute cardiopulmonary disease. Electronically Signed   By: Ranell Bring M.D.   On: 01/29/2024 12:49     Procedures   Medications Ordered in the ED  ketorolac  (TORADOL ) 15 MG/ML injection 30 mg (30 mg Intravenous Given 01/29/24 1402)                                    Medical Decision Making Amount and/or Complexity of Data Reviewed Labs: ordered. Radiology: ordered.   This patient presents to the ED for concern of chest pain, this involves an extensive number of treatment options, and is a complaint that carries with it a high risk of complications and morbidity.  The differential diagnosis includes STEMI, NSTEMI, arrhythmia, electrolyte abnormality, anxiety, GERD, anemia   Co morbidities / Chronic conditions that complicate the patient evaluation  Anxiety, frequent headaches   Additional history obtained:  Additional history obtained from EMR External records from outside source obtained and reviewed including family medicine notes   Lab Tests:  I Ordered, and personally interpreted labs.  The pertinent results include: BMP WNL, delta troponin less than 15, CBC WNL, negative pregnancy   Imaging Studies ordered:  I ordered imaging studies including chest x-ray I independently visualized and interpreted imaging which showed no acute cardiopulmonary disease I agree with the radiologist interpretation   Cardiac Monitoring: / EKG:  The patient was maintained on a cardiac monitor.  I personally viewed and interpreted the cardiac monitored which showed an underlying rhythm of: Sinus rhythm   Problem List / ED Course / Critical interventions / Medication  management I ordered medication including Toradol  I have reviewed the patients home medicines and have made adjustments as needed  Test / Admission - Considered:  Considered for admission or further workup however patient's vital signs, physical exam, labs, imaging of been reassuring.  Patient advised to continue following up with her PCP for evaluation of her vertigo-like symptoms.  Patient to follow-up with cardiology for further evaluation and workup.  Patient given return precautions.  I feel patient is safe for discharge at this time.     Final diagnoses:  Atypical chest pain    ED Discharge Orders  Ordered    Ambulatory referral to Cardiology       Comments: If you have not heard from the Cardiology office within the next 72 hours please call [phone].   01/29/24 1430               Francis Ileana SAILOR, PA-C 01/29/24 1430    Freddi Hamilton, MD 01/30/24 [phone]

## 2024-01-29 NOTE — ED Notes (Signed)
 [employer] is not working to print labels will use regular Pt. Labels on the blood sample.

## 2024-01-29 NOTE — Telephone Encounter (Signed)
 FYI Only or Action Required?: FYI only for provider.  Patient was last seen in primary care on 01/24/2024 by Copland, Harlene BROCKS, MD.  Called Nurse Triage reporting Chest Pain.  Symptoms began yesterday.  Interventions attempted: Nothing.  Symptoms are: gradually worsening.  Triage Disposition: Go to ED Now (or PCP Triage)  Patient/caregiver understands and will follow disposition?: Yes             Copied from CRM [id number]. Topic: Clinical - Red Word Triage >> Jan 29, 2024  9:51 AM Berwyn MATSU wrote: Red Word that prompted transfer to Nurse Triage: chest pain. Per patient her blood pressure has also been fluctuating. Reason for Disposition  [1] Chest pain lasts > 5 minutes AND [2] occurred in past 3 days (72 hours) (Exception: Feels exactly the same as previously diagnosed heartburn and has accompanying sour taste in mouth.)  Answer Assessment - Initial Assessment Questions Recommended ED now. Hx preeclampsia . Chest discomfort now mild to moderate constant.      1. LOCATION: Where does it hurt?       Chest pain left side  2. RADIATION: Does the pain go anywhere else? (e.g., into neck, jaw, arms, back)     Left shoulder area and above breast  3. ONSET: When did the chest pain begin? (Minutes, hours or days)      Yesterday  4. PATTERN: Does the pain come and go, or has it been constant since it started?  Does it get worse with exertion?      Constant  5. DURATION: How long does it last (e.g., seconds, minutes, hours)     Since yesterday  6. SEVERITY: How bad is the pain?  (e.g., Scale 1-10; mild, moderate, or severe)     Mild  7. CARDIAC RISK FACTORS: Do you have any history of heart problems or risk factors for heart disease? (e.g., angina, prior heart attack; diabetes, high blood pressure, high cholesterol, smoker, or strong family history of heart disease)     No  8. PULMONARY RISK FACTORS: Do you have any history of lung disease?  (e.g., blood  clots in lung, asthma, emphysema, birth control pills)     na 9. CAUSE: What do you think is causing the chest pain?     Not sure  10. OTHER SYMPTOMS: Do you have any other symptoms? (e.g., dizziness, nausea, vomiting, sweating, fever, difficulty breathing, cough)       Chest pain constant , nausea, headache BP 130/94 dizziness yesterday  11. PREGNANCY: Is there any chance you are pregnant? When was your last menstrual period?       na  Protocols used: Chest Pain-A-AH

## 2024-01-29 NOTE — ED Notes (Signed)
 Pt alert and oriented X 4 at the time of discharge. RR even and unlabored. No acute distress noted. Pt verbalized understanding of discharge instructions as discussed. Pt ambulatory to lobby at time of discharge.

## 2024-02-09 ENCOUNTER — Ambulatory Visit
Admission: RE | Admit: 2024-02-09 | Discharge: 2024-02-09 | Disposition: A | Discharge status: Home / Self Care | Source: Ambulatory Visit | Attending: Family Medicine | Admitting: Family Medicine

## 2024-02-09 DIAGNOSIS — H93A9 Pulsatile tinnitus, unspecified ear: Secondary | ICD-10-CM | POA: Diagnosis not present

## 2024-02-09 DIAGNOSIS — J341 Cyst and mucocele of nose and nasal sinus: Secondary | ICD-10-CM | POA: Diagnosis not present

## 2024-02-09 DIAGNOSIS — G462 Posterior cerebral artery syndrome: Secondary | ICD-10-CM | POA: Diagnosis not present

## 2024-02-09 MED ORDER — GADOPICLENOL 0.5 MMOL/ML IV SOLN
10.0000 mL | Freq: Once | INTRAVENOUS | Status: AC | PRN
  Administered 2024-02-09: 10 mL via INTRAVENOUS

## 2024-02-17 ENCOUNTER — Encounter: Payer: Self-pay | Admitting: Family Medicine

## 2024-02-23 ENCOUNTER — Encounter: Payer: Self-pay | Admitting: Family Medicine

## 2024-02-23 DIAGNOSIS — H93A9 Pulsatile tinnitus, unspecified ear: Secondary | ICD-10-CM

## 2024-03-18 DIAGNOSIS — N912 Amenorrhea, unspecified: Secondary | ICD-10-CM | POA: Diagnosis not present

## 2024-03-19 ENCOUNTER — Encounter: Payer: Self-pay | Admitting: Family Medicine

## 2024-03-20 DIAGNOSIS — N912 Amenorrhea, unspecified: Secondary | ICD-10-CM | POA: Diagnosis not present

## 2024-04-08 DIAGNOSIS — N911 Secondary amenorrhea: Secondary | ICD-10-CM | POA: Diagnosis not present

## 2024-04-17 DIAGNOSIS — Z3685 Encounter for antenatal screening for Streptococcus B: Secondary | ICD-10-CM | POA: Diagnosis not present

## 2024-04-17 DIAGNOSIS — Z3481 Encounter for supervision of other normal pregnancy, first trimester: Secondary | ICD-10-CM | POA: Diagnosis not present

## 2024-04-17 DIAGNOSIS — Z3A08 8 weeks gestation of pregnancy: Secondary | ICD-10-CM | POA: Diagnosis not present

## 2024-04-25 ENCOUNTER — Encounter (INDEPENDENT_AMBULATORY_CARE_PROVIDER_SITE_OTHER): Payer: Self-pay

## 2024-05-10 DIAGNOSIS — Z3A11 11 weeks gestation of pregnancy: Secondary | ICD-10-CM | POA: Diagnosis not present

## 2024-05-10 DIAGNOSIS — Z113 Encounter for screening for infections with a predominantly sexual mode of transmission: Secondary | ICD-10-CM | POA: Diagnosis not present

## 2024-05-10 DIAGNOSIS — Z3481 Encounter for supervision of other normal pregnancy, first trimester: Secondary | ICD-10-CM | POA: Diagnosis not present

## 2024-05-13 ENCOUNTER — Ambulatory Visit: Admitting: Family Medicine

## 2024-05-13 ENCOUNTER — Encounter: Payer: Self-pay | Admitting: Family Medicine

## 2024-05-13 ENCOUNTER — Ambulatory Visit: Payer: Self-pay

## 2024-05-13 VITALS — BP 130/80 | HR 106 | Temp 97.9°F | Resp 12 | Ht 67.0 in | Wt 208.8 lb

## 2024-05-13 DIAGNOSIS — J029 Acute pharyngitis, unspecified: Secondary | ICD-10-CM | POA: Diagnosis not present

## 2024-05-13 DIAGNOSIS — R051 Acute cough: Secondary | ICD-10-CM

## 2024-05-13 LAB — POCT RAPID STREP A (OFFICE): Rapid Strep A Screen: NEGATIVE

## 2024-05-13 LAB — POC COVID19 BINAXNOW: SARS Coronavirus 2 Ag: NEGATIVE

## 2024-05-13 MED ORDER — AMOXICILLIN 875 MG PO TABS: 875.0000 mg | ORAL_TABLET | Freq: Two times a day (BID) | ORAL | 0 refills | Status: AC

## 2024-05-13 NOTE — Telephone Encounter (Signed)
 FYI Only or Action Required?: FYI only for provider: appointment scheduled on 05/13/2024 at 1pm with patient's PCP Dr Harlene Copland.  Patient was last seen in primary care on 01/24/2024 by Copland, Harlene BROCKS, MD.  Called Nurse Triage reporting Cough.  Symptoms began a week ago.  Interventions attempted: Rest, hydration, or home remedies.  Symptoms are: gradually worsening.  Triage Disposition: See Physician Within 24 Hours  Patient/caregiver understands and will follow disposition?: Yes                   Copied from CRM [id number]. Topic: Clinical - Red Word Triage >> May 13, 2024  7:45 AM Katherine [name] wrote: Red Word that prompted transfer to Nurse Triage: Under the weather for about a wk. A cough that's not going away and left earache dull throbbing pain   ----------------------------------------------------------------------- From previous Reason for Contact - Refused Triage: Patient/caller voiced complaints of . Declined transfer to triage. Reason for Disposition  [1] Continuous (nonstop) coughing interferes with work or school AND [2] no improvement using cough treatment per Care Advice  Answer Assessment - Initial Assessment Questions Patient wanted to wait to see her PCP at 1pm today--offered an earlier appointment in the morning with another provider but she wanted to wait to see her PCP  Patient is advised to call us  back if anything changes or with any further questions/concerns. Patient is advised that if anything worsens to go to the Emergency Room. Patient verbalized understanding.     1. ONSET: When did the cough begin?      About a week ago 3. SPUTUM: Describe the color of your sputum (e.g., none, dry cough; clear, white, yellow, green)     green 4. HEMOPTYSIS: Are you coughing up any blood? If Yes, ask: How much? (e.g., flecks, streaks, tablespoons, etc.)     no 5. DIFFICULTY BREATHING: Are you having difficulty breathing? If Yes, ask:  How bad is it? (e.g., mild, moderate, severe)      no 6. FEVER: Do you have a fever? If Yes, ask: What is your temperature, how was it measured, and when did it start?     no 7. CARDIAC HISTORY: Do you have any history of heart disease? (e.g., heart attack, congestive heart failure)      no 8. LUNG HISTORY: Do you have any history of lung disease?  (e.g., pulmonary embolus, asthma, emphysema)     no 9. PE RISK FACTORS: Do you have a history of blood clots? (or: recent major surgery, recent prolonged travel, bedridden)     no 10. OTHER SYMPTOMS: Do you have any other symptoms? (e.g., runny nose, wheezing, chest pain)       Bilateral ear aches 11. PREGNANCY: Is there any chance you are pregnant? When was your last menstrual period?       Yes---12 weeks and one day pregnant 12. TRAVEL: Have you traveled out of the country in the last month? (e.g., travel history, exposures)       Traveled to [employer] a week ago in [place]   Protocols used: Cough - Acute Productive-A-AH

## 2024-05-13 NOTE — Telephone Encounter (Signed)
 Pt has scheduled OV.

## 2024-05-13 NOTE — Progress Notes (Signed)
 [facility] [address] [place], [place] [address] [phone]  Date:  05/13/2024   Name:  Katherine [name]   DOB:  [birth year]   MRN:  [id number]  PCP:  Watt Harlene BROCKS, MD    Chief Complaint: Cough (For 1 week/) and left ear pain   History of Present Illness:  Katherine [name] is a 33 y.o. very pleasant female patient who presents with the following:  Pt seen today with concern of acute cough Last seen by me in July when she was dealing with pulsatile tinnitus  Discussed the use of AI scribe software for clinical note transcription with the patient, who gave verbal consent to proceed.  History of Present Illness Katherine [name] is a 33 year old female who presents with persistent cough and sore throat.  She began experiencing symptoms nine days ago while at [facility], including a sore throat, ear pain, and cough. Initially, she felt unwell but started to feel better after a few days, although the cough persisted. Last night, she experienced a recurrence of sore throat and left ear pain, along with the presence of green mucus. No fever, vomiting, or diarrhea. She has not tested for COVID-19 at home.  She is currently twelve weeks pregnant and is concerned about what medications she can safely take. She feels tired all the time, which she attributes to her pregnancy. She has not experienced any bleeding, fluid loss, or abdominal pain during her pregnancy.  Several family members have experienced similar symptoms. She has a 81 yo daughter who brings home all the colds    Patient Active Problem List   Diagnosis Date Noted   Preeclampsia, severe 03/01/2021   IUGR (intrauterine growth restriction) affecting care of mother 02/24/2021   Transient hypertension of pregnancy 02/18/2021   Overweight 01/14/2016    Past Medical History:  Diagnosis Date   Anxiety    Asthma    Depression    Elevated blood  pressure reading    Frequent headaches    Recurrent UTI     Past Surgical History:  Procedure Laterality Date   CESAREAN SECTION N/A 03/01/2021   Procedure: CESAREAN SECTION;  Surgeon: Lequita Evalene LABOR, MD;  Location: [facility] LD ORS;  Service: Obstetrics;  Laterality: N/A;   DILATION AND EVACUATION N/A 02/11/2020   Procedure: DILATATION AND EVACUATION;  Surgeon: Dannielle Bouchard, DO;  Location: [facility] OR;  Service: Gynecology;  Laterality: N/A;    Social History   Tobacco Use   Smoking status: Never   Smokeless tobacco: Never  Vaping Use   Vaping status: Never Used  Substance Use Topics   Alcohol use: Yes    Alcohol/week: 2.0 - 3.0 standard drinks of alcohol    Types: 2 - 3 Standard drinks or equivalent per week    Comment: occassionally   Drug use: No    Family History  Problem Relation Age of Onset   Hypertension Father    Hypertension Brother     Allergies  Allergen Reactions   Prednisone  Other (See Comments)    Pt states It makes me feel weird.    Medication list has been reviewed and updated.  Current Outpatient Medications on File Prior to Visit  Medication Sig Dispense Refill   FLUoxetine  (PROZAC ) 40 MG capsule Take 1 capsule (40 mg total) by mouth daily. 90 capsule 3   SEMAGLUTIDE -WEIGHT MANAGEMENT [place] Inject into the skin.     No current  facility-administered medications on file prior to visit.    Review of Systems:  As per HPI- otherwise negative.   Physical Examination: Vitals:   05/13/24 1313  BP: 130/80  Pulse: (!) 106  Resp: 12  Temp: 97.9 F (36.6 C)  SpO2: 99%   Vitals:   05/13/24 1313  Weight: 208 lb 12.8 oz (94.7 kg)  Height: 5' 7 (1.702 m)   Body mass index is 32.7 kg/m. Ideal Body Weight: Weight in (lb) to have BMI = 25: 159.3  GEN: no acute distress.  Mild obesity, looks well  HEENT: Atraumatic, Normocephalic.  Ears and Nose: No external deformity. CV: RRR, No M/G/R. No JVD. No thrill. No extra heart sounds. PULM: CTA B, no  wheezes, crackles, rhonchi. No retractions. No resp. distress. No accessory muscle use. ABD: S, NT, ND, +BS. No rebound. No HSM. EXTR: No c/c/e PSYCH: Normally interactive. Conversant.   Results for orders placed or performed in visit on 05/13/24  POC COVID-19 BinaxNow   Collection Time: 05/13/24  2:41 PM  Result Value Ref Range   SARS Coronavirus 2 Ag Negative Negative  POCT rapid strep A   Collection Time: 05/13/24  2:42 PM  Result Value Ref Range   Rapid Strep A Screen Negative Negative    Assessment and Plan: Sore throat - Plan: POC COVID-19 BinaxNow, POCT rapid strep A, amoxicillin  (AMOXIL ) 875 MG tablet  Acute cough - Plan: POC COVID-19 BinaxNow  Assessment & Plan Acute pharyngitis and cough Persistent cough and sore throat with earache and green mucus suggest viral etiology. - Tested negative for streptococcal pharyngitis and covid 19 Gave her an rx for amox to hold and use if not feeling better in the next few days. Copied OTC med list for pregnancy from [facility] for her to have as a guide Asked her to let me know if not feeling better soon!   Pregnancy, first trimester [redacted] weeks pregnant with no complications. Reports fatigue and improved appetite. Recent ultrasound normal.  Signed Harlene Schroeder, MD

## 2024-05-13 NOTE — Patient Instructions (Addendum)
 It was good to see you today- use the amoxicillin  if you are not feeling better in the next couple of day   [employer] and Over-the-Counter Medications in Pregnancy The following medications are considered safe in pregnancy. Consult with your provider if you are taking  prescription medications and/or if you stop taking any prescriptions medications. If you are unsure of a  medication or have specific questions, please send a message through MyChart or, for urgent concerns,  call the pager with questions. Name brands have been used in this handout for consistency - please note that the generic form of  the mentioned name brands is also OK. If you have questions, please ask a pharmacist or your  provider. Nausea  Ginger (250mg  4 times a day or <2g/day if capsule; if dietary sources, normal amounts OK)   Vitamin B6 (pyridoxine) 25mg  oral 2-3 times a day + doxylamine (Unisom) 12.5-25mg  up to three  times a day (these are both over the counter) o You can take the Vitamin B6 alone or in combination with the doxylamine (Unisom) o Doxylamine (Unisom) can make you sleepy o If taking doxylamine, start with 12.5mg  tab once in the evening, and increase to 25mg  as  needed o If you are taking Diclegis (prescription only), DO NOT take Vitamin B6 or Unisom because this is already in the Diclegis o Ask the pharmacist to be sure you have the right vitamin and amount. Do not take more  than this amount. Diarrhea   Imodium per package instructions if no fever Heartburn  Antacids such as calcium  carbonate (Tums) - not to exceed package instructions  H2 Blockers such as famotidine (Pepcid) 20mg  up to two times a day, not to exceed package  instructions Constipation  Alfalfa tabs (start with 1, then 2, up to 6 per day)   Bulk-forming agents such as fiber pills/gummies/powder (Metamucil or Fiberall)   Laxatives such as magnesium  hydroxide (Milk of Magnesia) or MiraLAX (polyethylene  glycol). Take  only as needed and do not exceed package instructions. This can be taken in combination with stool  softener and fiber.  Stool softeners such as Colace (docusate sodium ) Hemorrhoids  Tucks pads with witch hazel  Preparation H  Hydrocortisone 1% cream  Hydrocortisone 1% cream can be combined with Preparation H Cold Symptoms Updated: 08/05/21  Tylenol  (acetaminophen ) 250-1000mg  - not to exceed 4000mg  per day. This can be used to  alleviate fevers, headaches, and body aches  Plain Mucinex  (guaifenesin ) for congestion  Plain Robitussin or Delsym (dextromethorphan) for cough  Plain Claritin  (loratadine ) or plain Zyrtec (cetirizine) for allergies and congestion  Do not take pseudoephedrine (name brand Sudafed) in the first trimester or if diagnosed with high  blood pressure  Anesthetic sore throat lozenges can ease the pain in your throat  Flonase  nasal spray, 1-2 sprays per nostril 1-2 times per day Headache  For your current headache - take the medications below all together: o Benadryl  25-50 mg o Tylenol  650-1000mg  o Extra Magnesium  750mg   To prevent headaches o Magnesium  500-1000mg  at bedtime o Vitamin B-2 (riboflavin) 200mg  twice a day with food Vaginal Discharge/itching  Monistat 7 Prenatal Vitamins  Prenatal vitamins are available over the counter in any pharmacy. You may choose any prenatal  vitamin. Check to make sure it contains folic acid, iron, calcium , and vitamin D . In general, avoid  taking extra prenatal vitamins or multivitamins with dosing more than what you need daily. High  doses of some vitamins may be harmful  to your baby.  Gummy prenatal vitamins do not contain iron. If you can tolerate it, consider a pill and/or add iron  separately. Questions about safety of medications during pregnancy Having questions about safety of medications during pregnancy is normal. One trusted website for a  quick answer is LactMed  ([email or web address], search "Lactmed" on your
